# Patient Record
Sex: Female | Born: 1975
Health system: Southern US, Community
[De-identification: ages and names within clinical notes are randomized; demographics above are authoritative.]

## PROBLEM LIST (undated history)

## (undated) DIAGNOSIS — I10 Essential (primary) hypertension: Secondary | ICD-10-CM

## (undated) DIAGNOSIS — Z9221 Personal history of antineoplastic chemotherapy: Secondary | ICD-10-CM

## (undated) DIAGNOSIS — H269 Unspecified cataract: Secondary | ICD-10-CM

## (undated) DIAGNOSIS — H409 Unspecified glaucoma: Secondary | ICD-10-CM

## (undated) DIAGNOSIS — C50919 Malignant neoplasm of unspecified site of unspecified female breast: Secondary | ICD-10-CM

## (undated) DIAGNOSIS — Z5189 Encounter for other specified aftercare: Secondary | ICD-10-CM

## (undated) DIAGNOSIS — D649 Anemia, unspecified: Secondary | ICD-10-CM

## (undated) HISTORY — PX: BREAST BIOPSY: SHX20

## (undated) HISTORY — PX: REDUCTION MAMMAPLASTY: SUR839

## (undated) HISTORY — DX: Anemia, unspecified: D64.9

## (undated) HISTORY — DX: Essential (primary) hypertension: I10

## (undated) HISTORY — PX: CHOLECYSTECTOMY: SHX55

## (undated) HISTORY — PX: ERCP: SHX60

## (undated) HISTORY — DX: Encounter for other specified aftercare: Z51.89

## (undated) HISTORY — DX: Unspecified glaucoma: H40.9

## (undated) HISTORY — DX: Malignant neoplasm of unspecified site of unspecified female breast: C50.919

## (undated) HISTORY — DX: Unspecified cataract: H26.9

## (undated) HISTORY — PX: MASTECTOMY: SHX3

---

## 2018-06-19 ENCOUNTER — Other Ambulatory Visit: Payer: Self-pay

## 2018-06-19 ENCOUNTER — Emergency Department (HOSPITAL_COMMUNITY)
Admission: EM | Admit: 2018-06-19 | Discharge: 2018-06-20 | Disposition: A | Discharge status: Home / Self Care | Payer: Medicaid Other | Attending: Emergency Medicine | Admitting: Emergency Medicine

## 2018-06-19 ENCOUNTER — Encounter (HOSPITAL_COMMUNITY): Payer: Self-pay

## 2018-06-19 DIAGNOSIS — H9319 Tinnitus, unspecified ear: Secondary | ICD-10-CM | POA: Insufficient documentation

## 2018-06-19 DIAGNOSIS — R51 Headache: Secondary | ICD-10-CM | POA: Diagnosis present

## 2018-06-19 DIAGNOSIS — I10 Essential (primary) hypertension: Secondary | ICD-10-CM | POA: Insufficient documentation

## 2018-06-19 NOTE — ED Triage Notes (Addendum)
Pt reports that she feels like her BP is elevated because she has had a headache for 2 days. She states that she is not medicated for hypertension. She denies chest pain, but states that sometimes it feels hard to take a deep breath. A&Ox4. Ambulatory. Spanish-speaking.  Alice [name] #[id number]

## 2018-06-20 ENCOUNTER — Emergency Department (HOSPITAL_COMMUNITY): Payer: Medicaid Other

## 2018-06-20 ENCOUNTER — Encounter (HOSPITAL_COMMUNITY): Payer: Self-pay | Admitting: Emergency Medicine

## 2018-06-20 LAB — I-STAT TROPONIN, ED: Troponin i, poc: 0 ng/mL (ref 0.00–0.08)

## 2018-06-20 LAB — CBC WITH DIFFERENTIAL/PLATELET
Abs Immature Granulocytes: 0.03 10*3/uL (ref 0.00–0.07)
BASOS ABS: 0.1 10*3/uL (ref 0.0–0.1)
Basophils Relative: 1 %
Eosinophils Absolute: 0.2 10*3/uL (ref 0.0–0.5)
Eosinophils Relative: 2 %
HCT: 38.9 % (ref 36.0–46.0)
Hemoglobin: 12.3 g/dL (ref 12.0–15.0)
IMMATURE GRANULOCYTES: 0 %
Lymphocytes Relative: 30 %
Lymphs Abs: 3.6 10*3/uL (ref 0.7–4.0)
MCH: 29.2 pg (ref 26.0–34.0)
MCHC: 31.6 g/dL (ref 30.0–36.0)
MCV: 92.4 fL (ref 80.0–100.0)
Monocytes Absolute: 0.8 10*3/uL (ref 0.1–1.0)
Monocytes Relative: 7 %
NRBC: 0 % (ref 0.0–0.2)
Neutro Abs: 7.2 10*3/uL (ref 1.7–7.7)
Neutrophils Relative %: 60 %
Platelets: 334 10*3/uL (ref 150–400)
RBC: 4.21 MIL/uL (ref 3.87–5.11)
RDW: 12.9 % (ref 11.5–15.5)
WBC: 11.9 10*3/uL — AB (ref 4.0–10.5)

## 2018-06-20 LAB — I-STAT CHEM 8, ED
BUN: 11 mg/dL (ref 6–20)
Calcium, Ion: 1.19 mmol/L (ref 1.15–1.40)
Chloride: 104 mmol/L (ref 98–111)
Creatinine, Ser: 0.9 mg/dL (ref 0.44–1.00)
GLUCOSE: 105 mg/dL — AB (ref 70–99)
HCT: 38 % (ref 36.0–46.0)
Hemoglobin: 12.9 g/dL (ref 12.0–15.0)
Potassium: 4.3 mmol/L (ref 3.5–5.1)
Sodium: 138 mmol/L (ref 135–145)
TCO2: 26 mmol/L (ref 22–32)

## 2018-06-20 LAB — I-STAT BETA HCG BLOOD, ED (MC, WL, AP ONLY): I-stat hCG, quantitative: <5 m[IU]/mL (ref <5)

## 2018-06-20 NOTE — ED Notes (Addendum)
Pt c/o "buzzing in her ears",pressure on her left side of her chest, and headaches x1 day. Pt also c/o being dizzy sometimes, but not constantly. Pt states she does not have a PCP at this time to monitor blood pressures. Pt does not have any pain complaints at this time. Pt denies ever taking blood pressure medications.

## 2018-06-20 NOTE — ED Provider Notes (Signed)
[facility] DEPT Provider Note   CSN: [id number] Arrival date & time: 06/19/18  2156     History   Chief Complaint Chief Complaint  Patient presents with  . Hypertension    HPI Alice [name] is a 43 y.o. female.  The history is provided by the patient. The history is limited by a language barrier. A language interpreter was used.  Hypertension  This is a chronic problem. The current episode started more than 1 week ago. The problem occurs constantly. The problem has not changed since onset.Associated symptoms include headaches. Pertinent negatives include no chest pain, no abdominal pain and no shortness of breath. Nothing aggravates the symptoms. Nothing relieves the symptoms. She has tried nothing for the symptoms. The treatment provided no relief.  Has buzzing in the ears and forehead pain.  BP at home is 130s of 100 per her cuff.  Has no PMD here in [place].  No weakness no numbness no changes in vision or speech.    History reviewed. No pertinent past medical history.  There are no active problems to display for this patient.   History reviewed. No pertinent surgical history.   OB History   No obstetric history on file.      Home Medications    Prior to Admission medications   Medication Sig Start Date End Date Taking? Authorizing Provider  ibuprofen (ADVIL,MOTRIN) 200 MG tablet Take 400 mg by mouth every 6 (six) hours as needed for fever, headache or cramping.    Yes [provider]    Family History History reviewed. No pertinent family history.  Social History Social History   Tobacco Use  . Smoking status: Not on file  Substance Use Topics  . Alcohol use: Not on file  . Drug use: Not on file     Allergies   Patient has no known allergies.   Review of Systems Review of Systems  Constitutional: Negative for diaphoresis and fever.  Eyes: Negative for photophobia and visual disturbance.  Respiratory:  Negative for shortness of breath.   Cardiovascular: Negative for chest pain.  Gastrointestinal: Negative for abdominal pain and vomiting.  Neurological: Positive for headaches. Negative for dizziness, tremors, seizures, syncope, facial asymmetry, speech difficulty, light-headedness and numbness.  All other systems reviewed and are negative.    Physical Exam Updated Vital Signs BP (!) 142/101 (BP Location: Right Arm)   Pulse 97   Temp 98.7 F (37.1 C) (Oral)   Resp 18   Ht 5\' 4"  (1.626 m)   Wt 90.7 kg   SpO2 99%   BMI 34.33 kg/m   Physical Exam Constitutional:      Appearance: Normal appearance. She is not ill-appearing.  HENT:     Head: Normocephalic and atraumatic.     Comments: Scarring of B TM    Nose: Nose normal.     Mouth/Throat:     Mouth: Mucous membranes are moist.  Eyes:     Extraocular Movements: Extraocular movements intact.     Conjunctiva/sclera: Conjunctivae normal.     Pupils: Pupils are equal, round, and reactive to light.  Neck:     Musculoskeletal: Normal range of motion and neck supple.  Cardiovascular:     Rate and Rhythm: Normal rate and regular rhythm.     Heart sounds: Normal heart sounds.  Pulmonary:     Effort: Pulmonary effort is normal.     Breath sounds: Normal breath sounds.  Abdominal:     General: Abdomen is flat.  Bowel sounds are normal.     Tenderness: There is no abdominal tenderness.  Musculoskeletal: Normal range of motion.  Skin:    General: Skin is warm and dry.     Capillary Refill: Capillary refill takes less than 2 seconds.  Neurological:     General: No focal deficit present.     Mental Status: She is alert and oriented to person, place, and time.     GCS: GCS eye subscore is 4. GCS verbal subscore is 5. GCS motor subscore is 6.     Cranial Nerves: Cranial nerves are intact.     Deep Tendon Reflexes: Babinski sign absent on the right side. Babinski sign absent on the left side.      ED Treatments / Results   Labs (all labs ordered are listed, but only abnormal results are displayed) Results for orders placed or performed during the hospital encounter of 06/19/18  CBC with Differential/Platelet  Result Value Ref Range   WBC 11.9 (H) 4.0 - 10.5 K/uL   RBC 4.21 3.87 - 5.11 MIL/uL   Hemoglobin 12.3 12.0 - 15.0 g/dL   HCT 38.9 36.0 - 46.0 %   MCV 92.4 80.0 - 100.0 fL   MCH 29.2 26.0 - 34.0 pg   MCHC 31.6 30.0 - 36.0 g/dL   RDW 12.9 11.5 - 15.5 %   Platelets 334 150 - 400 K/uL   nRBC 0.0 0.0 - 0.2 %   Neutrophils Relative % 60 %   Neutro Abs 7.2 1.7 - 7.7 K/uL   Lymphocytes Relative 30 %   Lymphs Abs 3.6 0.7 - 4.0 K/uL   Monocytes Relative 7 %   Monocytes Absolute 0.8 0.1 - 1.0 K/uL   Eosinophils Relative 2 %   Eosinophils Absolute 0.2 0.0 - 0.5 K/uL   Basophils Relative 1 %   Basophils Absolute 0.1 0.0 - 0.1 K/uL   Immature Granulocytes 0 %   Abs Immature Granulocytes 0.03 0.00 - 0.07 K/uL  I-Stat Chem 8, ED  Result Value Ref Range   Sodium 138 135 - 145 mmol/L   Potassium 4.3 3.5 - 5.1 mmol/L   Chloride 104 98 - 111 mmol/L   BUN 11 6 - 20 mg/dL   Creatinine, Ser 0.90 0.44 - 1.00 mg/dL   Glucose, Bld 105 (H) 70 - 99 mg/dL   Calcium, Ion 1.19 1.15 - 1.40 mmol/L   TCO2 26 22 - 32 mmol/L   Hemoglobin 12.9 12.0 - 15.0 g/dL   HCT 38.0 36.0 - 46.0 %  I-stat troponin, ED  Result Value Ref Range   Troponin i, poc 0.00 0.00 - 0.08 ng/mL   Comment 3          I-Stat Beta hCG blood, ED ([facility], [facility], [facility] only)  Result Value Ref Range   I-stat hCG, quantitative <5.0 <5 mIU/mL   Comment 3           Ct Head Wo Contrast  Result Date: 06/20/2018 CLINICAL DATA:  Headache and hypertension. No injury. EXAM: CT HEAD WITHOUT CONTRAST TECHNIQUE: Contiguous axial images were obtained from the base of the skull through the vertex without intravenous contrast. COMPARISON:  None. FINDINGS: Brain: No evidence of acute infarction, hemorrhage, hydrocephalus, extra-axial collection or mass lesion/mass effect.  Vascular: No hyperdense vessel or unexpected calcification. Skull: Normal. Negative for fracture or focal lesion. Sinuses/Orbits: No acute finding. Other: None. IMPRESSION: Normal head CT. Electronically Signed   By: Lucienne Capers M.D.   On: 06/20/2018 01:06  EKG EKG Interpretation  Date/Time:  Saturday June 20 2018 00:42:05 EST Ventricular Rate:  85 PR Interval:    QRS Duration: 86 QT Interval:  357 QTC Calculation: 425 R Axis:   88 Text Interpretation:  Sinus rhythm Confirmed by Randal Buba, Elizardo Chilson ([id number]) on 06/20/2018 3:09:30 AM   Radiology Ct Head Wo Contrast  Result Date: 06/20/2018 CLINICAL DATA:  Headache and hypertension. No injury. EXAM: CT HEAD WITHOUT CONTRAST TECHNIQUE: Contiguous axial images were obtained from the base of the skull through the vertex without intravenous contrast. COMPARISON:  None. FINDINGS: Brain: No evidence of acute infarction, hemorrhage, hydrocephalus, extra-axial collection or mass lesion/mass effect. Vascular: No hyperdense vessel or unexpected calcification. Skull: Normal. Negative for fracture or focal lesion. Sinuses/Orbits: No acute finding. Other: None. IMPRESSION: Normal head CT. Electronically Signed   By: Lucienne Capers M.D.   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  No more Qtips.  Follow up with a PMD regarding BP checks.  Based on JNC no indication for treatment at this time.    Return for pain, intractable cough, productive cough,fevers >100.4 unrelieved by medication, shortness of breath, intractable vomiting, or diarrhea, abdominal pain, Inability to tolerate liquids or food, cough, altered mental status or any concerns. No signs of systemic illness or infection. The patient is nontoxic-appearing on exam and vital signs are  within normal limits.   I have reviewed the triage vital signs and the nursing notes. Pertinent labs &imaging results that were available during my care of the patient were reviewed by me and considered in my medical decision making (see chart for details).  After history, exam, and medical workup I feel the patient has been appropriately medically screened and is safe for discharge home. Pertinent diagnoses were discussed with the patient. Patient was given return precautions.   Hanzel Pizzo, MD 06/20/18 [phone]

## 2019-04-11 DIAGNOSIS — Z23 Encounter for immunization: Secondary | ICD-10-CM | POA: Diagnosis not present

## 2019-06-11 DIAGNOSIS — C50919 Malignant neoplasm of unspecified site of unspecified female breast: Secondary | ICD-10-CM

## 2019-06-11 HISTORY — DX: Malignant neoplasm of unspecified site of unspecified female breast: C50.919

## 2019-09-20 DIAGNOSIS — N62 Hypertrophy of breast: Secondary | ICD-10-CM | POA: Diagnosis not present

## 2019-09-20 DIAGNOSIS — I10 Essential (primary) hypertension: Secondary | ICD-10-CM | POA: Diagnosis not present

## 2019-09-20 DIAGNOSIS — N644 Mastodynia: Secondary | ICD-10-CM | POA: Diagnosis not present

## 2019-09-20 DIAGNOSIS — Z7689 Persons encountering health services in other specified circumstances: Secondary | ICD-10-CM | POA: Diagnosis not present

## 2019-09-23 DIAGNOSIS — N6323 Unspecified lump in the left breast, lower outer quadrant: Secondary | ICD-10-CM | POA: Diagnosis not present

## 2019-09-23 DIAGNOSIS — R922 Inconclusive mammogram: Secondary | ICD-10-CM | POA: Diagnosis not present

## 2019-09-23 DIAGNOSIS — N6311 Unspecified lump in the right breast, upper outer quadrant: Secondary | ICD-10-CM | POA: Diagnosis not present

## 2019-09-30 ENCOUNTER — Other Ambulatory Visit: Payer: Self-pay | Admitting: Radiology

## 2019-09-30 DIAGNOSIS — C50412 Malignant neoplasm of upper-outer quadrant of left female breast: Secondary | ICD-10-CM | POA: Diagnosis not present

## 2019-09-30 DIAGNOSIS — R590 Localized enlarged lymph nodes: Secondary | ICD-10-CM | POA: Diagnosis not present

## 2019-09-30 DIAGNOSIS — N6311 Unspecified lump in the right breast, upper outer quadrant: Secondary | ICD-10-CM | POA: Diagnosis not present

## 2019-10-01 ENCOUNTER — Telehealth: Payer: Self-pay | Admitting: Oncology

## 2019-10-01 ENCOUNTER — Encounter: Payer: Self-pay | Admitting: *Deleted

## 2019-10-01 NOTE — Telephone Encounter (Signed)
Spoke with patient with interpreter to confirm morning [facility] appointment for 4/28, packet mailed to patient

## 2019-10-05 ENCOUNTER — Other Ambulatory Visit: Payer: Self-pay | Admitting: *Deleted

## 2019-10-05 ENCOUNTER — Encounter: Payer: Self-pay | Admitting: *Deleted

## 2019-10-05 DIAGNOSIS — C50411 Malignant neoplasm of upper-outer quadrant of right female breast: Secondary | ICD-10-CM | POA: Insufficient documentation

## 2019-10-05 DIAGNOSIS — Z170 Estrogen receptor positive status [ER+]: Secondary | ICD-10-CM | POA: Insufficient documentation

## 2019-10-05 NOTE — Progress Notes (Signed)
[facility]  [phone] [phone]     ID: Alice [name] DOB: [birth year]  MR#: [id number]  ATF#:[id number]  Patient Care Team: Patient, No Pcp Per as PCP - General (General Practice) Mauro Kaufmann, RN as Oncology Nurse Navigator Rockwell Germany, RN as Oncology Nurse Navigator Alphonsa Overall, MD as Consulting Physician (General Surgery) Maxfield Gildersleeve, Virgie Dad, MD as Consulting Physician (Oncology) Kyung Rudd, MD as Consulting Physician (Radiation Oncology) Chauncey Cruel, MD OTHER MD:  CHIEF COMPLAINT: estrogen receptor negative, Her2 positive breast cancer  CURRENT TREATMENT: Neoadjuvant chemotherapy   HISTORY OF CURRENT ILLNESS: Alice [name] presented with right breast pain with swelling and skin changes (duskiness and dimpling) and left breast pain at 6 o'clock. She underwent bilateral diagnostic mammography with tomography and bilateral breast ultrasonography at [facility] on 09/23/2019 showing: breast density category C; 5.2 cm irregular mass in right breast spanning 10-12 o'clock, with marked peau d'orange changes concerning for inflammatory component; 3.3 cm enlarged lymph node in right axilla; indeterminate 1 cm oval mass in left breast at 5 o'clock.  Accordingly on 09/30/2019 she proceeded to biopsy of the right breast area in question. The pathology from this procedure ([id number]) showed: invasive mammary carcinoma, grade 3, e-cadherin positive. Prognostic indicators significant for: estrogen receptor, 0% negative and progesterone receptor, 0% negative. Proliferation marker Ki67 at 80%. HER2 positive by immunohistochemistry (3+).  The biopsied right axillary lymph node was positive for metastatic carcinoma.  She also underwent cyst aspiration of the 1 cm mass in the left breast the same day.  The patient's subsequent history is as detailed below.   INTERVAL HISTORY: Alice [name] was evaluated in the multidisciplinary breast cancer clinic on  10/06/2019. Her case was also presented at the multidisciplinary breast cancer conference on the same day. At that time a preliminary plan was proposed: Neoadjuvant chemoimmunotherapy, likely a right modified radical versus right mastectomy with targeted axillary dissection, possibly bilateral mastectomies, adjuvant radiation, genetics testing   REVIEW OF SYSTEMS: The patient denies unusual headaches, visual changes, nausea, vomiting, stiff neck, dizziness, or gait imbalance. There has been no cough, phlegm production, or pleurisy, no chest pain or pressure, and no change in bowel or bladder habits. The patient denies fever, rash, bleeding, unexplained fatigue or unexplained weight loss.  She is currently not exercising regularly.  A detailed review of systems was otherwise entirely negative.   PAST MEDICAL HISTORY: Past Medical History:  Diagnosis Date  . Hypertension     PAST SURGICAL HISTORY: Past Surgical History:  Procedure Laterality Date  . CHOLECYSTECTOMY      FAMILY HISTORY: No family history on file.  As of 09/2019-- her father is 67 and her mother is 38. She has two brothers and one sister. There is no cancer in her family to her knowledge.    GYNECOLOGIC HISTORY:  No LMP recorded. Menarche: 44 years old Age at first live birth: 44 years old GX P 2 LMP regular periods lasting 3 days Contraceptive: previously used, has not used for the last 2 years HRT n/a  Hysterectomy? no BSO? no   SOCIAL HISTORY: (updated 09/2019)  Alice [name] is originally from [place]  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 Husband [name] is currently unemployed but is a good Training and development officer. She lives at home with [name] and their two children. Son [name], age 19, has special needs.  Son [name], age 30, is in high school. She attends     ADVANCED DIRECTIVES: In the absence  of any documentation to the contrary, the patient's spouse is their HCPOA.    HEALTH  MAINTENANCE: Social History   Tobacco Use  . Smoking status: Never Smoker  Substance Use Topics  . Alcohol use: Never  . Drug use: Never     Colonoscopy: n/a (age)  PAP: 2020  Bone density: n/a (age)   No Known Allergies  Current Outpatient Medications  Medication Sig Dispense Refill  . ibuprofen (ADVIL,MOTRIN) 200 MG tablet Take 400 mg by mouth every 6 (six) hours as needed for fever, headache or cramping.      No current facility-administered medications for this visit.    OBJECTIVE: [name] in no acute distress  Vitals:   10/06/19 0844  BP: (!) 164/89  Pulse: 91  Resp: 18  Temp: 98.5 F (36.9 C)  SpO2: 100%     Body mass index is 35.29 kg/m.   Wt Readings from Last 3 Encounters:  10/06/19 205 lb 9.6 oz (93.3 kg)  06/19/18 200 lb (90.7 kg)      ECOG FS:1 - Symptomatic but completely ambulatory  Ocular: Sclerae unicteric, pupils round and equal Ear-nose-throat: Wearing a mask Lymphatic: No cervical or supraclavicular adenopathy Lungs no rales or rhonchi Heart regular rate and rhythm Abd soft, nontender, positive bowel sounds MSK no focal spinal tenderness, no joint edema Neuro: non-focal, well-oriented, appropriate affect Breasts: The right breast is imaged below.  There is skin edema but no erythema.  The mass in the breast is easily movable, and clearly measures more than 5 cm by exam.  The right axillary adenopathy is barely palpable.  Left breast status post recent biopsy.  No suspicious findings.  Left axilla is benign.  Right breast 10/06/2019     LAB RESULTS:  CMP     Component Value Date/Time   NA 138 10/06/2019 0826   K 3.8 10/06/2019 0826   CL 104 10/06/2019 0826   CO2 24 10/06/2019 0826   GLUCOSE 117 (H) 10/06/2019 0826   BUN 11 10/06/2019 0826   CREATININE 0.79 10/06/2019 0826   CALCIUM 9.3 10/06/2019 0826   PROT 7.8 10/06/2019 0826   ALBUMIN 3.9 10/06/2019 0826   AST 26 10/06/2019 0826   ALT 47 (H) 10/06/2019  0826   ALKPHOS 113 10/06/2019 0826   BILITOT 0.7 10/06/2019 0826   GFRNONAA >60 10/06/2019 0826   GFRAA >60 10/06/2019 0826    No results found for: TOTALPROTELP, ALBUMINELP, A1GS, A2GS, BETS, BETA2SER, GAMS, MSPIKE, SPEI  Lab Results  Component Value Date   WBC 11.9 (H) 06/20/2018   NEUTROABS 7.2 06/20/2018   HGB 12.9 06/20/2018   HCT 38.0 06/20/2018   MCV 92.4 06/20/2018   PLT 334 06/20/2018    No results found for: LABCA2  No components found for: EGBTDV761  No results for input(s): INR in the last 168 hours.  No results found for: LABCA2  No results found for: YWV371  No results found for: GGY694  No results found for: WNI627  No results found for: CA2729  No components found for: HGQUANT  No results found for: CEA1 / No results found for: CEA1   No results found for: AFPTUMOR  No results found for: CHROMOGRNA  No results found for: KPAFRELGTCHN, LAMBDASER, KAPLAMBRATIO (kappa/lambda light chains)  No results found for: HGBA, HGBA2QUANT, HGBFQUANT, HGBSQUAN (Hemoglobinopathy evaluation)   No results found for: LDH  No results found for: IRON, TIBC, IRONPCTSAT (Iron and TIBC)  No results found for: FERRITIN  Urinalysis No  results found for: COLORURINE, APPEARANCEUR, LABSPEC, PHURINE, GLUCOSEU, HGBUR, BILIRUBINUR, KETONESUR, PROTEINUR, UROBILINOGEN, NITRITE, LEUKOCYTESUR   STUDIES: No results found.   ELIGIBLE FOR AVAILABLE RESEARCH PROTOCOL: no  ASSESSMENT: 44 y.o. [place] speaker status post right breast upper outer quadrant sites biopsy and right axillary lymph node biopsy 09/30/2019 for a clinical T3 N2, stage IIIA invasive ductal carcinoma, grade 3, estrogen and progesterone receptor negative, HER-2 amplified, with an MIB-1 of 80%.  (1) genetics testing  (2) neoadjuvant chemotherapy to consist of trastuzumab, pertuzumab, docetaxel and carboplatin every 21 days x 6 starting 10/19/2019  (3) trastuzumab and pertuzumab to be  continued to total 1 year  (4) definitive surgery to follow  (5) adjuvant radiation.  PLAN: I met today with Alice [name] to review her new diagnosis. Specifically we discussed the biology of her breast cancer, its diagnosis, staging, treatment  options and prognosis. We first reviewed the fact that cancer is not one disease but more than 100 different diseases and that it is important to keep them separate-- otherwise when friends and relatives discuss their own cancer experiences with Alice [name] confusion can result. Similarly we explained that if breast cancer spreads to the bone or liver, the patient would not have bone cancer or liver cancer, but breast cancer in the bone and breast cancer in the liver: one cancer in three places-- not 3 different cancers which otherwise would have to be treated in 3 different ways.  We discussed the difference between local and systemic therapy. In terms of loco-regional treatment, lumpectomy plus radiation is equivalent to mastectomy as far as survival is concerned. For this reason, and because the cosmetic results are generally superior, we recommend breast conserving surgery.   We also noted that in terms of sequencing of treatments, whether systemic therapy or surgery is done first does not affect the ultimate outcome.  In her case starting with neoadjuvant chemotherapy should give [place] good prognostic information depending on her response, make the definitive surgery easier, and possibly spare her a full axillary lymph node dissection.  We then discussed the rationale for systemic therapy. There is some risk that this cancer may have already spread to other parts of her body. Patients frequently ask at this point about bone scans, CAT scans and PET scans to find out if they have occult breast cancer somewhere else. The problem is that in early stage disease we are much more likely to find false positives then true cancers and this would expose the patient to unnecessary  procedures as well as unnecessary radiation. Scans cannot answer the question the patient really would like to know, which is whether she has microscopic disease elsewhere in her body. For those reasons we do not recommend them.  Of course we would proceed to aggressive evaluation of any symptoms that might suggest metastatic disease, but that is not the case here.  Next we went over the options for systemic therapy which are anti-estrogens, anti-HER-2 immunotherapy, and chemotherapy. Alice [name] does not meet criteria for anti-estrogens.  Her systemic therapy accordingly will be chemotherapy and anti-HER-2 treatment.  More specifically she will receive carboplatin and docetaxel together with trastuzumab and Pertuzumab every 21 days x 6.  Hopefully we can start treatment on 10/19/2019.  She will need staging studies, echocardiography, port placement, and instruction.  I will see her on 10/15/2019 to make sure things in place before starting the following Tuesday  She also qualifies for genetics testing.  If positive I would recommend bilateral mastectomies given her young  age.  In fact bilateral mastectomies may be a good option for her in any case since it would be very difficult to obtain symmetry after unilateral mastectomies given her very large breast size  The patient has a good understanding of this plan.  She agrees to it.  She knows the goal of treatment in her case is cure.  Total encounter time 65 minutes.Sarajane Jews C. Marinell Igarashi, MD 10/06/2019 10:11 AM Medical Oncology and Hematology [facility] [place], [place] [address] Tel. [phone]    Fax. [phone]   This document serves as a record of services personally performed by Lurline Del, MD. It was created on his behalf by Wilburn Mylar, a trained medical scribe. The creation of this record is based on the scribe's personal observations and the provider's statements to them.   I, Lurline Del MD,  have reviewed the above documentation for accuracy and completeness, and I agree with the above.    *Total Encounter Time as defined by the Centers for Medicare and Medicaid Services includes, in addition to the face-to-face time of a patient visit (documented in the note above) non-face-to-face time: obtaining and reviewing outside history, ordering and reviewing medications, tests or procedures, care coordination (communications with other health care professionals or caregivers) and documentation in the medical record.

## 2019-10-06 ENCOUNTER — Other Ambulatory Visit: Payer: Self-pay

## 2019-10-06 ENCOUNTER — Encounter: Payer: Self-pay | Admitting: *Deleted

## 2019-10-06 ENCOUNTER — Encounter: Payer: Self-pay | Admitting: Physical Therapy

## 2019-10-06 ENCOUNTER — Inpatient Hospital Stay (HOSPITAL_BASED_OUTPATIENT_CLINIC_OR_DEPARTMENT_OTHER): Payer: Medicaid Other | Admitting: Oncology

## 2019-10-06 ENCOUNTER — Other Ambulatory Visit: Payer: Self-pay | Admitting: *Deleted

## 2019-10-06 ENCOUNTER — Ambulatory Visit: Payer: Medicaid Other | Attending: Surgery | Admitting: Physical Therapy

## 2019-10-06 ENCOUNTER — Ambulatory Visit
Admission: RE | Admit: 2019-10-06 | Discharge: 2019-10-06 | Disposition: A | Discharge status: Home / Self Care | Payer: Medicaid Other | Source: Ambulatory Visit | Attending: Radiation Oncology | Admitting: Radiation Oncology

## 2019-10-06 ENCOUNTER — Inpatient Hospital Stay: Payer: Medicaid Other | Attending: Oncology

## 2019-10-06 ENCOUNTER — Encounter: Payer: Self-pay | Admitting: Licensed Clinical Social Worker

## 2019-10-06 ENCOUNTER — Ambulatory Visit: Payer: Medicaid Other | Admitting: Licensed Clinical Social Worker

## 2019-10-06 VITALS — BP 164/89 | HR 91 | Temp 98.5°F | Resp 18 | Ht 64.0 in | Wt 205.6 lb

## 2019-10-06 DIAGNOSIS — C50411 Malignant neoplasm of upper-outer quadrant of right female breast: Secondary | ICD-10-CM | POA: Insufficient documentation

## 2019-10-06 DIAGNOSIS — I10 Essential (primary) hypertension: Secondary | ICD-10-CM

## 2019-10-06 DIAGNOSIS — Z171 Estrogen receptor negative status [ER-]: Secondary | ICD-10-CM | POA: Insufficient documentation

## 2019-10-06 DIAGNOSIS — R293 Abnormal posture: Secondary | ICD-10-CM | POA: Diagnosis not present

## 2019-10-06 DIAGNOSIS — Z170 Estrogen receptor positive status [ER+]: Secondary | ICD-10-CM

## 2019-10-06 DIAGNOSIS — C773 Secondary and unspecified malignant neoplasm of axilla and upper limb lymph nodes: Secondary | ICD-10-CM | POA: Diagnosis not present

## 2019-10-06 DIAGNOSIS — C50911 Malignant neoplasm of unspecified site of right female breast: Secondary | ICD-10-CM | POA: Diagnosis not present

## 2019-10-06 LAB — CMP (CANCER CENTER ONLY)
ALT: 47 U/L — ABNORMAL HIGH (ref 0–44)
AST: 26 U/L (ref 15–41)
Albumin: 3.9 g/dL (ref 3.5–5.0)
Alkaline Phosphatase: 113 U/L (ref 38–126)
Anion gap: 10 (ref 5–15)
BUN: 11 mg/dL (ref 6–20)
CO2: 24 mmol/L (ref 22–32)
Calcium: 9.3 mg/dL (ref 8.9–10.3)
Chloride: 104 mmol/L (ref 98–111)
Creatinine: 0.79 mg/dL (ref 0.44–1.00)
GFR, Est AFR Am: >60 mL/min (ref >60)
GFR, Estimated: >60 mL/min (ref >60)
Glucose, Bld: 117 mg/dL — ABNORMAL HIGH (ref 70–99)
Potassium: 3.8 mmol/L (ref 3.5–5.1)
Sodium: 138 mmol/L (ref 135–145)
Total Bilirubin: 0.7 mg/dL (ref 0.3–1.2)
Total Protein: 7.8 g/dL (ref 6.5–8.1)

## 2019-10-06 LAB — GENETIC SCREENING ORDER

## 2019-10-06 MED ORDER — LORATADINE 10 MG PO TABS
10.0000 mg | ORAL_TABLET | Freq: Every day | ORAL | 1 refills | Status: DC
Start: 2019-10-06 — End: 2019-11-30

## 2019-10-06 MED ORDER — PROCHLORPERAZINE MALEATE 10 MG PO TABS: 10.0000 mg | ORAL_TABLET | Freq: Four times a day (QID) | ORAL | 1 refills | Status: DC | PRN

## 2019-10-06 MED ORDER — LORAZEPAM 0.5 MG PO TABS: 0.5000 mg | ORAL_TABLET | Freq: Every evening | ORAL | 0 refills | Status: DC | PRN

## 2019-10-06 MED ORDER — DEXAMETHASONE 4 MG PO TABS: 8.0000 mg | ORAL_TABLET | Freq: Two times a day (BID) | ORAL | 1 refills | Status: DC

## 2019-10-06 MED ORDER — LIDOCAINE-PRILOCAINE 2.5-2.5 % EX CREA: TOPICAL_CREAM | CUTANEOUS | 3 refills | Status: DC

## 2019-10-06 NOTE — Progress Notes (Signed)
START ON PATHWAY REGIMEN - Breast     A cycle is every 21 days:     Pertuzumab      Pertuzumab      Trastuzumab-xxxx      Trastuzumab-xxxx      Carboplatin      Docetaxel   **Always confirm dose/schedule in your pharmacy ordering system**  Patient Characteristics: Preoperative or Nonsurgical Candidate (Clinical Staging), Neoadjuvant Therapy followed by Surgery, Invasive Disease, Chemotherapy, HER2 Positive, ER Negative/Unknown Therapeutic Status: Preoperative or Nonsurgical Candidate (Clinical Staging) AJCC M Category: cM0 AJCC Grade: G3 Breast Surgical Plan: Neoadjuvant Therapy followed by Surgery ER Status: Negative (-) AJCC 8 Stage Grouping: IIIA HER2 Status: Positive (+) AJCC T Category: cT3 AJCC N Category: cN2 PR Status: Negative (-) Intent of Therapy: Curative Intent, Discussed with Patient

## 2019-10-06 NOTE — Progress Notes (Signed)
Radiation Oncology         ([phone]) [phone] ________________________________  Name: Alice [name]        MRN: [id number]  Date of Service: 10/06/2019 DOB: Apr 19, 1976  GL:OVFIEPPI, Blanche East, MD  Alphonsa Overall, MD     REFERRING PHYSICIAN: Alphonsa Overall, MD   DIAGNOSIS: The encounter diagnosis was Malignant neoplasm of upper-outer quadrant of right breast in female, estrogen receptor positive ([place]).   HISTORY OF PRESENT ILLNESS: Alice [name] is a 44 y.o. female seen in the multidisciplinary breast clinic for a new diagnosis of right breast cancer. The patient was noted to have right skin dimpling and she presented for diagnostic imaging that revealed a 3.3 cm distortion.  Further diagnostic imaging revealed a mass from 10-12 o'clock in the right breast measuring up to 5.2 cm with 5 mm of trabecular skin thickening and one abnormal axillary lymph node.  In imaging the breast, it appears that she did have features of possible lymphedema versus peau d'orange.  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  She is seen today to discuss treatment recommendations for her cancer.    PREVIOUS RADIATION THERAPY: No   PAST MEDICAL HISTORY:  Past Medical History:  Diagnosis Date  . Hypertension        PAST SURGICAL HISTORY: Past Surgical History:  Procedure Laterality Date  . CHOLECYSTECTOMY       FAMILY HISTORY: No family history on file.   SOCIAL HISTORY:  reports that she has never smoked. She does not have any smokeless tobacco history on file. She reports that she does not drink alcohol or use drugs.  The patient is married and lives in [place].  She is [place]  She is lived in the [place] approximately 10 years.  She works in a Company secretary.   ALLERGIES: Patient has no known  allergies.   MEDICATIONS:  Current Outpatient Medications  Medication Sig Dispense Refill  . dexamethasone (DECADRON) 4 MG tablet Take 2 tablets (8 mg total) by mouth 2 (two) times daily. Start the day before Taxotere. Take once the day after, then 2 times a day x 2d. 30 tablet 1  . ibuprofen (ADVIL,MOTRIN) 200 MG tablet Take 400 mg by mouth every 6 (six) hours as needed for fever, headache or cramping.     . lidocaine-prilocaine (EMLA) cream Apply to affected area once 30 g 3  . loratadine (CLARITIN) 10 MG tablet Take 1 tablet (10 mg total) by mouth daily. 60 tablet 1  . LORazepam (ATIVAN) 0.5 MG tablet Take 1 tablet (0.5 mg total) by mouth at bedtime as needed (Nausea or vomiting). 20 tablet 0  . prochlorperazine (COMPAZINE) 10 MG tablet Take 1 tablet (10 mg total) by mouth every 6 (six) hours as needed (Nausea or vomiting). 30 tablet 1   No current facility-administered medications for this encounter.     REVIEW OF SYSTEMS: On review of systems, the patient reports that she is doing well overall. She denies any chest pain, shortness of breath, cough, fevers, chills, night sweats, unintended weight changes. She denies any bowel or bladder disturbances, and denies abdominal pain, nausea or vomiting. She denies any new musculoskeletal or joint aches or pains. A complete review of systems is obtained and is otherwise negative.     PHYSICAL EXAM:  Wt Readings from Last 3 Encounters:  10/06/19 205 lb 9.6 oz (93.3 kg)  06/19/18 200 lb (90.7 kg)   Temp Readings from Last 3 Encounters:  10/06/19 98.5 F (36.9 C) (Temporal)  06/19/18 98.7 F (37.1 C) (Oral)   BP Readings from Last 3 Encounters:  10/06/19 (!) 164/89  06/20/18 (!) 142/101   Pulse Readings from Last 3 Encounters:  10/06/19 91  06/20/18 97    In general this is a well appearing Hispanic female in no acute distress. She's alert and oriented x4 and appropriate throughout the examination. Cardiopulmonary assessment is  negative for acute distress and she exhibits normal effort. Bilateral breast exam is deferred.    ECOG = 1  0 - Asymptomatic (Fully active, able to carry on all predisease activities without restriction)  1 - Symptomatic but completely ambulatory (Restricted in physically strenuous activity but ambulatory and able to carry out work of a light or sedentary nature. For example, light housework, office work)  2 - Symptomatic, <50% in bed during the day (Ambulatory and capable of all self care but unable to carry out any work activities. Up and about more than 50% of waking hours)  3 - Symptomatic, >50% in bed, but not bedbound (Capable of only limited self-care, confined to bed or chair 50% or more of waking hours)  4 - Bedbound (Completely disabled. Cannot carry on any self-care. Totally confined to bed or chair)  5 - Death   Eustace Pen MM, Creech RH, Tormey DC, et al. [phone]). "Toxicity and response criteria of the [facility]". [place] Oncol. 5 (6): 649-55    LABORATORY DATA:  Lab Results  Component Value Date   WBC 11.9 (H) 06/20/2018   HGB 12.9 06/20/2018   HCT 38.0 06/20/2018   MCV 92.4 06/20/2018   PLT 334 06/20/2018   Lab Results  Component Value Date   NA 138 10/06/2019   K 3.8 10/06/2019   CL 104 10/06/2019   CO2 24 10/06/2019   Lab Results  Component Value Date   ALT 47 (H) 10/06/2019   AST 26 10/06/2019   ALKPHOS 113 10/06/2019   BILITOT 0.7 10/06/2019      RADIOGRAPHY: No results found.     IMPRESSION/PLAN: 1. 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 Dr. Lisbeth Renshaw discusses the pathology findings and reviews the nature of right breast disease. The consensus from the breast conference includes proceeding with neoadjuvant chemotherapy. Her surgery is to be determined, but following surgery, she would benefit from  external radiotherapy to reduce the risks of local recurrence. Dr. Jana Hakim  also anticipates a role for antiestrogen therapy given her age. We discussed the risks, benefits, short, and long term effects of radiotherapy, and the patient is interested in proceeding. Dr. Lisbeth Renshaw discusses the delivery and logistics of radiotherapy and anticipates a course of 6 1/2 weeks of radiotherapy to either the breast or chest wall and regional nodes. We will see her back about 2 weeks after surgery to discuss the simulation process and anticipate we starting radiotherapy about 4-6 weeks after surgery.  2. Possible genetic predisposition to malignancy. The patient is a candidate for genetic testing given her personal and family history. She was offered referral and will meet with genetic counseling today.   In a visit lasting 60 minutes, greater than 50% of the time was spent face to face reviewing her case, as well as in preparation of, discussing, and coordinating the  patient's care.  Communication was also facilitated by the presence of a Spanish-speaking interpreter.  The above documentation reflects my direct findings during this shared patient visit. Please see the separate note by Dr. Lisbeth Renshaw on this date for the remainder of the patient's plan of care.    Carola Rhine, PAC

## 2019-10-06 NOTE — Progress Notes (Signed)
REFERRING PROVIDER: Chauncey Cruel, MD [address] [place],  [place] [address]  PRIMARY PROVIDER:  Drue Flirt, MD  PRIMARY REASON FOR VISIT:  1. Malignant neoplasm of upper-outer quadrant of right breast in female, estrogen receptor positive ([place])    I connected with [name] on 10/06/2019 at 11:15 AM EDT by Jackquline Denmark and verified that I am speaking with the correct person using two identifiers.    Patient location: [facility] Provider location: [facility]   HISTORY OF PRESENT ILLNESS:   [name], a 44 y.o. female, was seen for a [place] cancer genetics consultation at the request of Dr. Jana Hakim due to a personal history of cancer.  [name] presents to clinic today to discuss the possibility of a hereditary predisposition to cancer, genetic testing, and to further clarify her future cancer risks, as well as potential cancer risks for family members.   In 2021, at the age of 23, [name] was diagnosed with invasive ductal carcinoma of the right breast. The treatment plan includes neoadjuvant chemotherapy, surgery, and adjuvant radiation.    CANCER HISTORY:  Oncology History  Malignant neoplasm of upper-outer quadrant of right breast in female, estrogen receptor positive ([place])  10/05/2019 Initial Diagnosis   Malignant neoplasm of upper-outer quadrant of right breast in female, estrogen receptor positive ([place])   10/19/2019 -  Chemotherapy   The patient had palonosetron (ALOXI) injection 0.25 mg, 0.25 mg, Intravenous,  Once, 0 of 6 cycles pegfilgrastim-jmdb (FULPHILA) injection 6 mg, 6 mg, Subcutaneous,  Once, 0 of 6 cycles CARBOplatin (PARAPLATIN) 750 mg in sodium chloride 0.9 % 250 mL chemo infusion, 750 mg (100 % of original dose 750 mg), Intravenous,  Once, 0 of 6 cycles Dose modification:   (original dose 750 mg, Cycle 1) DOCEtaxel (TAXOTERE) 150 mg in sodium chloride 0.9 % 250 mL chemo infusion, 75 mg/m2 = 150 mg, Intravenous,  Once, 0 of 6  cycles fosaprepitant (EMEND) 150 mg in sodium chloride 0.9 % 145 mL IVPB, 150 mg, Intravenous,  Once, 0 of 6 cycles pertuzumab (PERJETA) 420 mg in sodium chloride 0.9 % 250 mL chemo infusion, 420 mg (50 % of original dose 840 mg), Intravenous, Once, 0 of 6 cycles Dose modification: 420 mg (original dose 840 mg, Cycle 1, Reason: Provider Judgment) trastuzumab-dkst (OGIVRI) 756 mg in sodium chloride 0.9 % 250 mL chemo infusion, 8 mg/kg = 756 mg, Intravenous,  Once, 0 of 6 cycles  for chemotherapy treatment.       RISK FACTORS:  Menarche was at age 77 First live birth at age 38.  OCP use: yes previously Ovaries intact: yes.  Hysterectomy: no.  Menopausal status: premenopausal.  HRT use: 0 years. Colonoscopy: no; not examined. Mammogram within the last year: yes. Up to date with pelvic exams: yes. Any excessive radiation exposure in the past: no  Past Medical History:  Diagnosis Date  . Hypertension     Past Surgical History:  Procedure Laterality Date  . CHOLECYSTECTOMY      Social History   Socioeconomic History  . Marital status: Married    Spouse name: Not on file  . Number of children: Not on file  . Years of education: Not on file  . Highest education level: Not on file  Occupational History  . Not on file  Tobacco Use  . Smoking status: Never Smoker  Substance and Sexual Activity  . Alcohol use: Never  . Drug use: Never  . Sexual activity: Not on file  Other Topics Concern  . Not on file  Social History Narrative  . Not on file   Social Determinants of Health   Financial Resource Strain:   . Difficulty of Paying Living Expenses:   Food Insecurity:   . Worried About Charity fundraiser in the Last Year:   . Arboriculturist in the Last Year:   Transportation Needs:   . Film/video editor (Medical):   Marland Kitchen Lack of Transportation (Non-Medical):   Physical Activity:   . Days of Exercise per Week:   . Minutes of Exercise per Session:   Stress:   .  Feeling of Stress :   Social Connections:   . Frequency of Communication with Friends and Family:   . Frequency of Social Gatherings with Friends and Family:   . Attends Religious Services:   . Active Member of Clubs or Organizations:   . Attends Archivist Meetings:   Marland Kitchen Marital Status:      FAMILY HISTORY:  We obtained a detailed, 4-generation family history.  Significant diagnoses are listed below: No family history on file.   [name] has two sons, one is 17 and the other is 51. Patient has 2 brothers and 1 sister, all living, no cancer.   Patient has limited information about the rest of her family and does not know of any other cancers. Her mother is living at 31 and father is living at 47. Her mother had 40 siblings and father had many siblings as well. All grandparents are deceased over the age of 23.   [name] is unaware of previous family history of genetic testing for hereditary cancer risks. Patient's maternal ancestors are of unknown descent, and paternal ancestors are of unknown descent. There is no reported Ashkenazi Jewish ancestry. There is no known consanguinity.  GENETIC COUNSELING ASSESSMENT: [name] is a 44 y.o. female with a personal history of breast cancer which is somewhat suggestive of a hereditary cancer syndrome and predisposition to cancer. We, therefore, discussed and recommended the following at today's visit.   DISCUSSION: We discussed that 5 - 10% of breast cancer is hereditary, with most cases associated with BRCA1/BRCA2 mutations.  There are other genes that can be associated with hereditary breast cancer syndromes. We discussed that testing is beneficial for several reasons including surgical decision-making for breast cancer, knowing how to follow individuals after completing their treatment, and understand if other family members could be at risk for cancer and allow them to undergo genetic testing.   We reviewed the characteristics,  features and inheritance patterns of hereditary cancer syndromes. We also discussed genetic testing, including the appropriate family members to test, the process of testing, insurance coverage and turn-around-time for results. We discussed the implications of a negative, positive and/or variant of uncertain significant result. We recommended [name] pursue genetic testing for the Common Hereditary Cancers gene panel.   The Common Hereditary Cancers Panel offered by [facility] includes sequencing and/or deletion duplication testing of the following 48 genes: APC, ATM, AXIN2, BARD1, BMPR1A, BRCA1, BRCA2, BRIP1, CDH1, CDKN2A (p14ARF), CDKN2A (p16INK4a), CKD4, CHEK2, CTNNA1, DICER1, EPCAM (Deletion/duplication testing only), GREM1 (promoter region deletion/duplication testing only), KIT, MEN1, MLH1, MSH2, MSH3, MSH6, MUTYH, NBN, NF1, NHTL1, PALB2, PDGFRA, PMS2, POLD1, POLE, PTEN, RAD50, RAD51C, RAD51D, RNF43, SDHB, SDHC, SDHD, SMAD4, SMARCA4. STK11, TP53, TSC1, TSC2, and VHL.  The following genes were evaluated for sequence changes only: SDHA and HOXB13 c.251G>A variant only.  Based on [name] personal and  family history of cancer, she meets medical criteria for genetic testing. Despite that she meets criteria, she may still have an out of pocket cost.   PLAN: After considering the risks, benefits, and limitations, [name] provided informed consent to pursue genetic testing and the blood sample was sent to [facility] for analysis of the Common Hereditary Cancers Panel. Results should be available within approximately 2-3 weeks' time, at which point they will be disclosed by telephone to [name], as will any additional recommendations warranted by these results. [name] will receive a summary of her genetic counseling visit and a copy of her results once available. This information will also be available in Epic.   [name] questions were answered to her satisfaction today. Our  contact information was provided should additional questions or concerns arise. Thank you for the referral and allowing [place] to share in the care of your patient.   [name] Rogue, MS, [facility] Genetic Counselor [email or web address] Phone: [phone]  The patient was seen for a total of 15 minutes in face-to-face genetic counseling. An interpreter and Engineer, building services were also present.  Drs. Magrinat, Lindi Adie and/or Burr Medico were available for discussion regarding this case.   _______________________________________________________________________ For Office Staff:  Number of people involved in session: 3 Was an Intern/ student involved with case: no

## 2019-10-06 NOTE — Patient Instructions (Signed)

## 2019-10-06 NOTE — Therapy (Signed)
[place], [place], [address] Phone: [phone]   Fax:  [phone]  Physical Therapy Evaluation  Patient Details  Name: Alice [name] MRN: [id number] Date of Birth: 02/28/1976 Referring Provider (PT): Dr. Alphonsa Overall   Encounter Date: 10/06/2019  PT End of Session - 10/06/19 0951    Visit Number  1    Number of Visits  2    Date for PT Re-Evaluation  12/01/19    PT Start Time  1012    PT Stop Time  1041    PT Time Calculation (min)  29 min    Activity Tolerance  Patient tolerated treatment well    Behavior During Therapy  [facility] for tasks assessed/performed       Past Medical History:  Diagnosis Date  . Hypertension     Past Surgical History:  Procedure Laterality Date  . CHOLECYSTECTOMY      There were no vitals filed for this visit.   Subjective Assessment - 10/06/19 0941    Subjective  Patient reports she is here today to be seen by her meical team for her newly diagnosed right breast cancer.    Patient is accompained by:  Interpreter    Pertinent History  Patient was diagnosed on 09/23/2019 with right grade III invasive ductal carcinoma breast cancer. It measures 5.2 cm and is located in the upper outer quadrant. It is ER/PR negative, HER2 positive with a Ki67 of 80%. She has multiple enlarged nodes with one biopsied and found to be positive.    Patient Stated Goals  Reduce lymphedema risk and learn post op shoulder ROM HEP    Currently in Pain?  No/denies         [facility] PT Assessment - 10/06/19 0001      Assessment   Medical Diagnosis  Right breast cancer    Referring Provider (PT)  Dr. Alphonsa Overall    Onset Date/Surgical Date  09/23/19    Hand Dominance  Right    Prior Therapy  none      Precautions   Precautions  Other (comment)    Precaution Comments  active cancer      Restrictions   Weight Bearing Restrictions  No      Balance Screen   Has the patient fallen in the  past 6 months  No    Has the patient had a decrease in activity level because of a fear of falling?   No    Is the patient reluctant to leave their home because of a fear of falling?   No      Home Environment   Living Environment  Private residence    Living Arrangements  Spouse/significant other;Children   Husband, 43 and 36 y.o. 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    Leisure  She does not exercise      Cognition   Overall Cognitive Status  Within Functional Limits for tasks assessed      Posture/Postural Control   Posture/Postural Control  Postural limitations    Postural Limitations  Rounded Shoulders;Forward head      ROM / Strength   AROM / PROM / Strength  AROM;Strength      AROM   Overall AROM Comments  Cervical AROM is all WNL  AROM Assessment Site  Shoulder    Right/Left Shoulder  Right;Left    Right Shoulder Extension  50 Degrees    Right Shoulder Flexion  151 Degrees    Right Shoulder ABduction  141 Degrees    Right Shoulder Internal Rotation  57 Degrees    Right Shoulder External Rotation  79 Degrees    Left Shoulder Extension  51 Degrees    Left Shoulder Flexion  145 Degrees    Left Shoulder ABduction  156 Degrees    Left Shoulder Internal Rotation  85 Degrees    Left Shoulder External Rotation  86 Degrees      Strength   Overall Strength  Within functional limits for tasks performed        LYMPHEDEMA/ONCOLOGY QUESTIONNAIRE - 10/06/19 0948      Type   Cancer Type  Right breast cancer      Lymphedema Assessments   Lymphedema Assessments  Upper extremities      Right Upper Extremity Lymphedema   10 cm Proximal to Olecranon Process  33.3 cm    Olecranon Process  28 cm    10 cm Proximal to Ulnar Styloid Process  26.2 cm    Just Proximal to Ulnar Styloid Process  16.9 cm    Across Hand at [employer]   20.1 cm    At [place] of 2nd Digit  6.6 cm      Left Upper Extremity Lymphedema   10 cm Proximal to Olecranon Process  34.2 cm    Olecranon Process  27.8 cm    10 cm Proximal to Ulnar Styloid Process  26.2 cm    Just Proximal to Ulnar Styloid Process  17.4 cm    Across Hand at [employer]  19.4 cm    At [place] of 2nd Digit  6.4 cm          Quick Dash - 10/06/19 0001    Open a tight or new jar  No difficulty    Do heavy household chores (wash walls, wash floors)  No difficulty    Carry a shopping bag or briefcase  No difficulty    Wash your back  No difficulty    Use a knife to cut food  No difficulty    Recreational activities in which you take some force or impact through your arm, shoulder, or hand (golf, hammering, tennis)  No difficulty    During the past week, to what extent has your arm, shoulder or hand problem interfered with your normal social activities with family, friends, neighbors, or groups?  Not at all    During the past week, to what extent has your arm, shoulder or hand problem limited your work or other regular daily activities  Not at all    Arm, shoulder, or hand pain.  None    Tingling (pins and needles) in your arm, shoulder, or hand  None    Difficulty Sleeping  No difficulty    DASH Score  0 %        Objective measurements completed on examination: See above findings.     Patient was instructed today in a home exercise program today for post op shoulder range of motion. These included active assist shoulder flexion in sitting, scapular retraction, wall walking with shoulder abduction, and hands behind head external rotation.  She was encouraged to do these twice a day, holding 3 seconds and repeating 5 times when permitted by her physician.  PT Education - 10/06/19 0948    Education Details  Lymphedema risk reduction and post op shoulder ROM HEP    Person(s) Educated  Patient    Methods  Explanation;Demonstration;Handout    Comprehension   Returned demonstration;Verbalized understanding          PT Long Term Goals - 10/06/19 1047      PT LONG TERM GOAL #1   Title  Patient will demostrate she has regained full shoulder ROM post operatively compared to baseline assessments.    Time  8    Period  Weeks    Status  New    Target Date  12/01/19      Breast Clinic Goals - 10/06/19 1047      Patient will be able to verbalize understanding of pertinent lymphedema risk reduction practices relevant to her diagnosis specifically related to skin care.   Time  1    Period  Days    Status  Achieved      Patient will be able to return demonstrate and/or verbalize understanding of the post-op home exercise program related to regaining shoulder range of motion.   Time  1    Period  Days    Status  Achieved      Patient will be able to verbalize understanding of the importance of attending the postoperative After Breast Cancer Class for further lymphedema risk reduction education and therapeutic exercise.   Time  1    Period  Days    Status  Achieved            Plan - 10/06/19 0953    Clinical Impression Statement  Patient was diagnosed on 09/23/2019 with right grade III invasive ductal carcinoma breast cancer. It measures 5.2 cm and is located in the upper outer quadrant. It is ER/PR negative, HER2 positive with a Ki67 of 80%. She has multiple enlarged nodes with one biopsied and found to be positive. Her multidisciplinary medical team met prior to her assessments to determine a recommended treatment plan. She is planning to have neoadjuvant chemotherapy followed by a right mastectomy and axillary lymph node dissection, and radiation. She will benefit from a post op PT reassessment to determine needs. She will then undergo L-Dex screenings every 3 months for 2 years to closely monitor for subclinical lymphedema.    Stability/Clinical Decision Making  Stable/Uncomplicated    Clinical Decision Making  Low    PT  Treatment/Interventions  ADLs/Self Care Home Management;Therapeutic exercise;Patient/family education    PT Next Visit Plan  Will reassess 3-4 weeks post op to determine needs    PT Home Exercise Plan  Post op shoulder ROM HEP    Consulted and Agree with Plan of Care  Patient       Patient will benefit from skilled therapeutic intervention in order to improve the following deficits and impairments:  Decreased knowledge of precautions, Postural dysfunction, Decreased range of motion, Pain, Impaired UE functional use  Visit Diagnosis: Malignant neoplasm of upper-outer quadrant of right breast in female, estrogen receptor negative ([place]) - Plan: PT plan of care cert/re-cert  Abnormal posture - Plan: PT plan of care cert/re-cert   Patient will follow up at outpatient cancer rehab 3-4 weeks following surgery.  If the patient requires physical therapy at that time, a specific plan will be dictated and sent to the referring physician for approval. The patient was educated today on appropriate basic range of motion exercises to begin post operatively and the importance of attending the  After Breast Cancer class following surgery.  Patient was educated today on lymphedema risk reduction practices as it pertains to recommendations that will benefit the patient immediately following surgery.  She verbalized good understanding.    The patient was assessed using the L-Dex machine today to produce a lymphedema index baseline score. The patient will be reassessed on a regular basis (typically every 3 months) to obtain new L-Dex scores. If the score is > 6.5 points away from his/her baseline score indicating onset of subclinical lymphedema, it will be recommended to wear a compression garment for 4 weeks, 12 hours per day and then be reassessed. If the score continues to be > 6.5 points from baseline at reassessment, we will initiate lymphedema treatment. Assessing in this manner has a 95% rate of preventing  clinically significant lymphedema.    Problem List Patient Active Problem List   Diagnosis Date Noted  . Malignant neoplasm of upper-outer quadrant of right breast in female, estrogen receptor positive ([place]) 10/05/2019   Annia Friendly, PT 10/06/19 10:51 AM  [facility] [place], [place], [address] Phone: [phone]   Fax:  [phone]  Name: Krysta Bloomfield MRN: [id number] Date of Birth: 1975-07-10

## 2019-10-07 NOTE — Progress Notes (Signed)
Center Psychosocial Distress Screening Clinical Social Work  Clinical Social Work was referred by distress screening protocol.  The patient scored a 1 on the Psychosocial Distress Thermometer which indicates mild distress. Counseling intern met with patient in exam room with an interpreter present to assess for distress and other psychosocial needs.   [facility] DISTRESS SCREENING 10/07/2019  Screening Type Initial Screening  Distress experienced in past week (1-10) 1  Practical problem type Work/school;Transportation  Referral to clinical social work Yes  Referral to support programs Yes  Other Spanish speaking (requires interpreter) so can't take advantage of support programming; referral for a spanish speaking Alight Guide completed on 10/06/19   [facility] DISTRESS SCREENING 10/07/2019  Screening Type   Distress experienced in past week (1-10) 1  Practical problem type Transportation;Work/school  Referral to clinical social work   Referral to support programs   Other    Patient said she is worried about the cancer, but she is concerned about not being able to work during her cancer treatment. Additionally, pt needs help with transportation to and from appointments.  Clinical Social Worker follow up needed: Yes, to follow up with transportation needs and possible financial needs related to lost income during treatment.  [facility] [place] Counseling Intern Voicemail:  [phone]

## 2019-10-08 ENCOUNTER — Telehealth: Payer: Self-pay | Admitting: Oncology

## 2019-10-08 NOTE — Telephone Encounter (Cosign Needed)
Scheduled appts per 4/28 los. Spanish interpreter Almyra Free will call patient and inform her of next appt. Pt to get appt calendar at next visit per appt notes.

## 2019-10-12 ENCOUNTER — Other Ambulatory Visit: Payer: Self-pay | Admitting: Radiology

## 2019-10-13 ENCOUNTER — Ambulatory Visit (HOSPITAL_COMMUNITY)
Admission: RE | Admit: 2019-10-13 | Discharge: 2019-10-13 | Disposition: A | Discharge status: Home / Self Care | Payer: Medicaid Other | Source: Ambulatory Visit | Attending: Oncology | Admitting: Oncology

## 2019-10-13 ENCOUNTER — Encounter (HOSPITAL_COMMUNITY): Payer: Self-pay

## 2019-10-13 ENCOUNTER — Other Ambulatory Visit: Payer: Self-pay

## 2019-10-13 ENCOUNTER — Other Ambulatory Visit: Payer: Self-pay | Admitting: Oncology

## 2019-10-13 DIAGNOSIS — C50411 Malignant neoplasm of upper-outer quadrant of right female breast: Secondary | ICD-10-CM | POA: Insufficient documentation

## 2019-10-13 DIAGNOSIS — Z170 Estrogen receptor positive status [ER+]: Secondary | ICD-10-CM | POA: Diagnosis not present

## 2019-10-13 DIAGNOSIS — Z79899 Other long term (current) drug therapy: Secondary | ICD-10-CM | POA: Insufficient documentation

## 2019-10-13 DIAGNOSIS — Z452 Encounter for adjustment and management of vascular access device: Secondary | ICD-10-CM | POA: Diagnosis not present

## 2019-10-13 DIAGNOSIS — I10 Essential (primary) hypertension: Secondary | ICD-10-CM | POA: Insufficient documentation

## 2019-10-13 HISTORY — PX: IR IMAGING GUIDED PORT INSERTION: IMG5740

## 2019-10-13 LAB — CBC WITH DIFFERENTIAL/PLATELET
Abs Immature Granulocytes: 0.03 10*3/uL (ref 0.00–0.07)
Basophils Absolute: 0.1 10*3/uL (ref 0.0–0.1)
Basophils Relative: 1 %
Eosinophils Absolute: 0.2 10*3/uL (ref 0.0–0.5)
Eosinophils Relative: 3 %
HCT: 43.4 % (ref 36.0–46.0)
Hemoglobin: 13.7 g/dL (ref 12.0–15.0)
Immature Granulocytes: 0 %
Lymphocytes Relative: 32 %
Lymphs Abs: 2.4 10*3/uL (ref 0.7–4.0)
MCH: 28.9 pg (ref 26.0–34.0)
MCHC: 31.6 g/dL (ref 30.0–36.0)
MCV: 91.6 fL (ref 80.0–100.0)
Monocytes Absolute: 0.6 10*3/uL (ref 0.1–1.0)
Monocytes Relative: 8 %
Neutro Abs: 4.1 10*3/uL (ref 1.7–7.7)
Neutrophils Relative %: 56 %
Platelets: 407 10*3/uL — ABNORMAL HIGH (ref 150–400)
RBC: 4.74 MIL/uL (ref 3.87–5.11)
RDW: 12.3 % (ref 11.5–15.5)
WBC: 7.3 10*3/uL (ref 4.0–10.5)
nRBC: 0 % (ref 0.0–0.2)

## 2019-10-13 LAB — PROTIME-INR
INR: 1 (ref 0.8–1.2)
Prothrombin Time: 12.3 seconds (ref 11.4–15.2)

## 2019-10-13 LAB — HCG, SERUM, QUALITATIVE: Preg, Serum: NEGATIVE

## 2019-10-13 MED ORDER — CEFAZOLIN SODIUM-DEXTROSE 2-4 GM/100ML-% IV SOLN: 2.0000 g | INTRAVENOUS | Status: AC

## 2019-10-13 MED ORDER — SODIUM CHLORIDE 0.9 % IV SOLN: INTRAVENOUS | Status: DC

## 2019-10-13 MED ORDER — MIDAZOLAM HCL 2 MG/2ML IJ SOLN
INTRAMUSCULAR | Status: AC | PRN
  Administered 2019-10-13 (×4): 1 mg via INTRAVENOUS

## 2019-10-13 MED ORDER — FENTANYL CITRATE (PF) 100 MCG/2ML IJ SOLN
INTRAMUSCULAR | Status: AC
  Filled 2019-10-13: qty 2

## 2019-10-13 MED ORDER — FENTANYL CITRATE (PF) 100 MCG/2ML IJ SOLN
INTRAMUSCULAR | Status: AC | PRN
  Administered 2019-10-13 (×2): 50 ug via INTRAVENOUS

## 2019-10-13 MED ORDER — CEFAZOLIN SODIUM-DEXTROSE 2-4 GM/100ML-% IV SOLN
INTRAVENOUS | Status: AC
  Administered 2019-10-13: 2 g via INTRAVENOUS
  Filled 2019-10-13: qty 100

## 2019-10-13 MED ORDER — MIDAZOLAM HCL 2 MG/2ML IJ SOLN
INTRAMUSCULAR | Status: AC
  Filled 2019-10-13: qty 4

## 2019-10-13 MED ORDER — LIDOCAINE-EPINEPHRINE 1 %-1:100000 IJ SOLN
INTRAMUSCULAR | Status: AC | PRN
  Administered 2019-10-13 (×2): 10 mL via INTRADERMAL

## 2019-10-13 MED ORDER — HEPARIN SOD (PORK) LOCK FLUSH 100 UNIT/ML IV SOLN
INTRAVENOUS | Status: AC
  Filled 2019-10-13: qty 5

## 2019-10-13 MED ORDER — LIDOCAINE-EPINEPHRINE 1 %-1:100000 IJ SOLN
INTRAMUSCULAR | Status: AC
  Filled 2019-10-13: qty 1

## 2019-10-13 NOTE — Discharge Instructions (Signed)
Insercin del dispositivo de perfusin implantable, cuidados posteriores Implanted Port Insertion, Care After Esta hoja le brinda informacin sobre cmo cuidarse despus del procedimiento. El mdico tambin podr darle indicaciones ms especficas. Comunquese con el mdico si tiene problemas o preguntas. Qu puedo esperar despus del procedimiento? Despus del procedimiento, es comn tener los siguientes sntomas:  Molestias en el lugar de la insercin del dispositivo.  Moretones en la piel alrededor del dispositivo. Esto debera mejorar luego de 3 o 4 das. Siga estas indicaciones en su casa: Cuidado del dispositivo  Luego de que le coloquen el dispositivo, le darn una tarjeta de informacin del fabricante. La tarjeta contiene informacin acerca del dispositivo. Llvela siempre con usted.  Cuide el dispositivo como se lo haya indicado el mdico. Pregntele al mdico si usted o un familiar puede recibir capacitacin para cuidar del dispositivo en casa. Un enfermero a domicilio tambin puede cuidar del dispositivo.  Asegrese de recordar el tipo de dispositivo que tiene. Cuidados de las incisiones      Siga las indicaciones del mdico acerca de los cuidados del lugar de la insercin del dispositivo. Asegrese de hacer lo siguiente: ? Lvese las manos con agua y jabn antes y despus de cambiar la venda (vendaje). Use desinfectante para manos si no dispone de agua y jabn. ? Cambie el vendaje como se lo haya indicado el mdico. ? No retire los puntos (suturas), la goma para cerrar la piel o las tiras adhesivas. Es posible que estos cierres cutneos deban permanecer en la piel durante 2semanas o ms tiempo. Si los bordes de las tiras adhesivas empiezan a despegarse y enroscarse, puede recortar los que estn sueltos. No retire las tiras adhesivas por completo a menos que el mdico se lo indique.  Controle el lugar de insercin del dispositivo todos los das para detectar signos de  infeccin. Est atento a los siguientes signos: ? Enrojecimiento, hinchazn o dolor. ? Lquido o sangre. ? Calor. ? Pus o mal olor. Actividad  Retome sus actividades normales como se lo haya indicado el mdico. Pregntele al mdico qu actividades son seguras para usted.  No levante ningn objeto que pese ms de 10libras (4.5kg) o que supere el lmite de peso que le hayan indicado, hasta que el mdico le diga que puede hacerlo. Indicaciones generales  Tome los medicamentos de venta libre y los recetados solamente como se lo haya indicado el mdico.  No tome baos de inmersin, no nade ni use el jacuzzi hasta que el mdico lo autorice. Pregntele al mdico si puede ducharse. Tal vez solo le permitan darse baos de esponja.  No conduzca durante 24horas si le administraron un sedante durante el procedimiento.  Use un brazalete de alerta mdico en caso de emergencia. Esto permitir que cualquier mdico que lo atienda sepa que tiene un dispositivo.  Concurra a todas las visitas de seguimiento como se lo haya indicado el mdico. Esto es importante. Comunquese con un mdico si:  No puede purgar el dispositivo con solucin salina como se le indic, o no puede extraer sangre del dispositivo.  Tiene fiebre o escalofros.  Tiene enrojecimiento, hinchazn o dolor alrededor del lugar de la insercin del dispositivo.  Le sale lquido o sangre del lugar de la insercin del dispositivo.  El lugar de la insercin del dispositivo est caliente al tacto.  Tiene pus o percibe mal olor que proviene del lugar de la insercin del dispositivo. Solicite ayuda inmediatamente si:  Siente falta de aire o dolor en el pecho.  Tiene   hemorragia proveniente del lugar donde tiene el dispositivo y no puede controlarla. Resumen  Cuide el dispositivo como se lo haya indicado el mdico. Tenga la tarjeta informacin del fabricante con usted en todo momento.  Cambie el vendaje como se lo haya indicado el  mdico.  Comunquese con un mdico si tiene fiebre o escalofros o si tiene enrojecimiento, hinchazn o dolor alrededor del lugar de insercin del dispositivo.  Concurra a todas las visitas de seguimiento como se lo haya indicado el mdico. Esta informacin no tiene como fin reemplazar el consejo del mdico. Asegrese de hacerle al mdico cualquier pregunta que tenga. Document Revised: 01/29/2018 Document Reviewed: 01/29/2018 Elsevier Patient Education  2020 Elsevier Inc. Gua de cuidados en el hogar del dispositivo de perfusin implantable Implanted [place] Guide Un dispositivo de perfusin implantable es un dispositivo que se coloca debajo de la piel. Por lo general, se coloca en el pecho. Puede usarse para suministrar medicamentos intravenosos, tomar muestras de sangre o realizar dilisis. Puede tener un dispositivo implantable si:  Necesita administrarse un medicamento intravenoso que podra provocar una irritacin en las venas pequeas de las manos o los brazos.  Necesita medicamentos por va intravenosa a largo plazo, como antibiticos.  Necesita recibir nutricin por va intravenosa durante un largo perodo.  Debe someterse a dilisis. Si tiene un dispositivo, su mdico no tendr que recurrir a las venas de sus brazos para estos procedimientos. Puede tener algunas limitaciones ms al usar el dispositivo que si usara otro tipo de vas intravenosas a largo plazo, y es probable que pueda retomar sus actividades normales cuando cure la incisin. Un dispositivo de perfusin implantable tiene dos partes principales:  Depsito. El depsito es la parte en donde se inserta la aguja para administrar los medicamentos o extraer sangre. El depsito es redondo. Luego de colocado, se ve como un rea pequea y elevada debajo de su piel.  Catter. El catter es un tubo delgado y flexible que conecta el depsito a una vena. Los medicamentos que se introducen en el depsito, pasan a travs del catter  y luego llegan a la vena. Cmo se accede al dispositivo de perfusin? Para acceder al dispositivo:  Puede que se le coloque crema anestsica sobre la piel encima del dispositivo.  El mdico se colocar una mscara y guantes estriles.  La piel sobre el dispositivo se limpiar con cuidado con un jabn antisptico y se dejar secar.  Su mdico pellizcar suavemente el dispositivo e insertar una aguja dentro.  Su mdico revisar el retorno de sangre para garantizar que el dispositivo est en la vena y no est obstruido.  Si el dispositivo debe tener accesibilidad para un suministro continuo de medicamentos (infusin constante), su mdico colocar un vendaje (venda) claro sobre el lugar donde se coloc la aguja. El vendaje y la aguja debern cambiarse todas las semanas o segn las indicaciones del mdico. Qu es el purgado? El purgado ayuda a que el dispositivo no se obstruya. Siga las indicaciones del mdico acerca de cmo y cundo purgar el dispositivo. Los dispositivos de perfusin generalmente se purgan con una solucin salina o un medicamento llamado heparina. La necesidad de purgar depender de cmo se use el dispositivo:  Si se usa solo eventualmente para suministrar medicamentos o extraer sangre, se debe purgar el dispositivo: ? Antes y despus de la administracin de los medicamentos. ? Antes y despus de una extraccin de sangre. ? Como parte de una rutina de mantenimiento. Es recomendable que se purgue cada 4 o 6   semanas.  Si la infusin es constante, no necesitar limpiarlo.  Deseche las agujas y las jeringas en un contenedor para desechos destinado a objetos punzantes(recipiente para objetos punzantes). Puede comprar un recipiente para objetos punzantes en una farmacia, o puede hacer uno usted mismo con una botella de plstico rgida y un cobertor. Durante cunto tiempo tendr implantado el dispositivo? El dispositivo puede permanecer implantado por el tiempo que el mdico  considere necesario. Cuando llega el momento de retirar el dispositivo, deber someterse a una ciruga. Esta ciruga ser similar a la realizada en el momento de la colocacin del dispositivo. Siga estas indicaciones en su casa:   Purgue el dispositivo como se lo haya indicado el mdico.  Si debe recibir una infusin durante varios das, siga las indicaciones del mdico acerca de cmo cuidar el lugar donde tiene colocado el dispositivo. Haga lo siguiente: ? Lvese las manos con agua y jabn antes de cambiar el apsito. Use desinfectante para manos con alcohol si no dispone de agua y jabn. ? Cambie el vendaje como se lo haya indicado el mdico. ? Coloque vendas usadas o bolsas de infusin en una bolsa plstica. Tire esa bolsa en el contenedor de basura. ? Mantenga el vendaje que cubre la aguja limpio y seco. No deje que se moje. ? No use tijeras u objetos punzantes cerca del tubo. ? Mantenga el tubo sujetado, excepto cuando se use.  Controle el lugar donde tiene colocado el dispositivo todos los das para detectar signos de infeccin. Est atento a los siguientes signos: ? Dolor, hinchazn o enrojecimiento. ? Lquido o sangre. ? Pus o mal olor.  Proteja la piel alrededor del lugar donde tiene colocado el dispositivo. ? Evite usar sostenes si los breteles rozan o irritan el lugar. ? Proteja la piel alrededor del dispositivo cuando se coloque el cinturn de seguridad. Coloque una almohadilla suave en el pecho, de ser necesario.  Dchese o bese como se lo haya indicado el mdico. Se puede mojar el lugar siempre y cuando no est recibiendo activamente una infusin.  Retome sus actividades normales como se lo haya indicado el mdico. Pregntele al mdico qu actividades son seguras para usted.  Utilice un brazalete o lleve consigo una tarjeta de alerta mdica en todo momento. Esto les permitir a los mdicos saber que tiene un dispositivo implantable en caso de una emergencia. Solicite ayuda  de inmediato si:  Tiene enrojecimiento, hinchazn o dolor en el lugar donde tiene el dispositivo.  Le sale una mayor cantidad de lquido o de sangre del lugar donde tiene el dispositivo.  Tiene pus o percibe mal olor que proviene del lugar donde tiene colocado el dispositivo.  Tiene fiebre. Resumen  Por lo general, los dispositivos implantados se colocan en el pecho para un acceso intravenoso a largo plazo.  Siga las indicaciones del mdico sobre cmo purgar el dispositivo y cambiar las vendas (vendajes).  Cuide el rea alrededor de donde tiene colocado el dispositivo, evite la ropa que presione el rea, y preste atencin a los signos de infeccin.  Proteja la piel alrededor del dispositivo cuando se coloque el cinturn de seguridad. Coloque una almohadilla suave en el pecho, de ser necesario.  Busque ayuda de inmediato si tiene fiebre o enrojecimiento, hinchazn, dolor, drenaje o mal olor en el lugar donde tiene el dispositivo. Esta informacin no tiene como fin reemplazar el consejo del mdico. Asegrese de hacerle al mdico cualquier pregunta que tenga. Document Revised: 02/27/2017 Document Reviewed: 02/27/2017 Elsevier Patient Education  2020 Elsevier   Inc. Sedacin consciente moderada en los adultos, cuidados posteriores (Moderate Conscious Sedation, Adult, Care After) Estas indicaciones le proporcionan informacin acerca de cmo deber cuidarse despus del procedimiento. El mdico tambin podr darle instrucciones ms especficas. El tratamiento ha sido planificado segn las prcticas mdicas actuales, pero en algunos casos pueden ocurrir problemas. Comunquese con el mdico si tiene algn problema o dudas despus del procedimiento. QU ESPERAR DESPUS DEL PROCEDIMIENTO Despus del procedimiento, es comn:  Sentirse somnoliento durante varias horas.  Sentirse torpe y tener problemas de equilibrio durante varias horas.  Perder el sentido de la realidad durante varias  horas.  Vomitar si come muy pronto. INSTRUCCIONES PARA EL CUIDADO EN EL HOGAR  Durante al menos 24horas despus del procedimiento:  No haga lo siguiente: ? Participar en actividades que impliquen posibles cadas o lesiones. ? Conducir vehculos. ? Operar maquinarias pesadas. ? Beber alcohol. ? Tomar somnferos o medicamentos que causen somnolencia. ? Firmar documentos legales ni tomar decisiones importantes. ? Cuidar a nios por su cuenta.  Hacer reposo. Comida y bebida  Siga la dieta recomendada por el mdico.  Si vomita: ? Pruebe agua, jugo o sopa cuando usted pueda beber sin vomitar. ? Asegrese de no tener nuseas antes de ingerir alimentos slidos. Instrucciones generales  Permanezca con un adulto responsable hasta que est completamente despierto y consciente.  Tome los medicamentos de venta libre y los recetados solamente como se lo haya indicado el mdico.  Si fuma, no lo haga sin supervisin.  Concurra a todas las visitas de control como se lo haya indicado el mdico. Esto es importante. SOLICITE ATENCIN MDICA SI:  Sigue teniendo nuseas o vomitando.  Tiene sensacin de desvanecimiento.  Le aparece una erupcin cutnea.  Tiene fiebre. SOLICITE ATENCIN MDICA DE INMEDIATO SI:  Tiene dificultad para respirar. Esta informacin no tiene como fin reemplazar el consejo del mdico. Asegrese de hacerle al mdico cualquier pregunta que tenga. Document Revised: 02/01/2016 Document Reviewed: 09/16/2015 Elsevier Patient Education  2020 Elsevier Inc.  

## 2019-10-13 NOTE — Progress Notes (Signed)
Pharmacist Chemotherapy Monitoring - Initial Assessment    Anticipated start date: 10/19/2019   Regimen:  . Are orders appropriate based on the patient's diagnosis, regimen, and cycle? Yes . Does the plan date match the patient's scheduled date? Yes . Is the sequencing of drugs appropriate? Yes . Are the premedications appropriate for the patient's regimen? Yes . Prior Authorization for treatment is: Approved o If applicable, is the correct biosimilar selected based on the patient's insurance? yes  Organ Function and Labs: Marland Kitchen Are dose adjustments needed based on the patient's renal function, hepatic function, or hematologic function? Yes . Are appropriate labs ordered prior to the start of patient's treatment? Yes . Other organ system assessment, if indicated: trastuzumab: Echo/ MUGA and pertuzumab: Echo/ MUGA . The following baseline labs, if indicated, have been ordered: N/A  Dose Assessment: . Are the drug doses appropriate? Yes . Are the following correct: o Drug concentrations Yes o IV fluid compatible with drug Yes o Administration routes Yes o Timing of therapy Yes . If applicable, does the patient have documented access for treatment and/or plans for port-a-cath placement? yes . If applicable, have lifetime cumulative doses been properly documented and assessed? not applicable Lifetime Dose Tracking  No doses have been documented on this patient for the following tracked chemicals: Doxorubicin, Epirubicin, Idarubicin, Daunorubicin, Mitoxantrone, Bleomycin, Oxaliplatin, Carboplatin, Liposomal Doxorubicin  o   Toxicity Monitoring/Prevention: . The patient has the following take home antiemetics prescribed: Prochlorperazine . The patient has the following take home medications prescribed: N/A . Medication allergies and previous infusion related reactions, if applicable, have been reviewed and addressed. No . The patient's current medication list has been assessed for drug-drug  interactions with their chemotherapy regimen. no significant drug-drug interactions were identified on review.  Order Review: . Are the treatment plan orders signed? Yes . Is the patient scheduled to see a provider prior to their treatment? Yes  I verify that I have reviewed each item in the above checklist and answered each question accordingly.  Kathy Wares D 10/13/2019 4:14 PM

## 2019-10-13 NOTE — Procedures (Signed)
Interventional Radiology Procedure Note  Procedure: LT IJ POWER PORT  Complications: None  Estimated Blood Loss: MIN  Findings: TIP SVCRA      

## 2019-10-13 NOTE — Consult Note (Signed)
Chief Complaint: Patient was seen in consultation today for Port-A-Cath placement  Referring Physician(s): Chauncey Cruel  Supervising Physician: Daryll Brod  Patient Status: [facility] - Out-pt  History of Present Illness: Alice [name] is a 44 y.o. female with history of newly diagnosed metastatic right breast carcinoma who presents today for Port-A-Cath placement for chemotherapy.  Past Medical History:  Diagnosis Date  . Hypertension     Past Surgical History:  Procedure Laterality Date  . CHOLECYSTECTOMY      Allergies: Patient has no known allergies.  Medications: Prior to Admission medications   Medication Sig Start Date End Date Taking? Authorizing Provider  dexamethasone (DECADRON) 4 MG tablet Take 2 tablets (8 mg total) by mouth 2 (two) times daily. Start the day before Taxotere. Take once the day after, then 2 times a day x 2d. 10/06/19   Magrinat, Virgie Dad, MD  ibuprofen (ADVIL,MOTRIN) 200 MG tablet Take 400 mg by mouth every 6 (six) hours as needed for fever, headache or cramping.     [provider]  lidocaine-prilocaine (EMLA) cream Apply to affected area once 10/06/19   Magrinat, Virgie Dad, MD  loratadine (CLARITIN) 10 MG tablet Take 1 tablet (10 mg total) by mouth daily. 10/06/19   Magrinat, Virgie Dad, MD  LORazepam (ATIVAN) 0.5 MG tablet Take 1 tablet (0.5 mg total) by mouth at bedtime as needed (Nausea or vomiting). 10/06/19   Magrinat, Virgie Dad, MD  prochlorperazine (COMPAZINE) 10 MG tablet Take 1 tablet (10 mg total) by mouth every 6 (six) hours as needed (Nausea or vomiting). 10/06/19   Magrinat, Virgie Dad, MD     No family history on file.  Social History   Socioeconomic History  . Marital status: Married    Spouse name: Not on file  . Number of children: Not on file  . Years of education: Not on file  . Highest education level: Not on file  Occupational History  . Not on file  Tobacco Use  . Smoking status: Never Smoker  Substance and  Sexual Activity  . Alcohol use: Never  . Drug use: Never  . Sexual activity: Not on file  Other Topics Concern  . Not on file  Social History Narrative  . Not on file   Social Determinants of Health   Financial Resource Strain:   . Difficulty of Paying Living Expenses:   Food Insecurity:   . Worried About Charity fundraiser in the Last Year:   . Arboriculturist in the Last Year:   Transportation Needs:   . Film/video editor (Medical):   Marland Kitchen Lack of Transportation (Non-Medical):   Physical Activity:   . Days of Exercise per Week:   . Minutes of Exercise per Session:   Stress:   . Feeling of Stress :   Social Connections:   . Frequency of Communication with Friends and Family:   . Frequency of Social Gatherings with Friends and Family:   . Attends Religious Services:   . Active Member of Clubs or Organizations:   . Attends Archivist Meetings:   Marland Kitchen Marital Status:       Review of Systems currently denies fever, headache, chest pain, dyspnea, cough, abdominal/back pain, nausea, vomiting or bleeding.  Vital Signs: Blood pressure 164/89, temp 98.5, heart rate 91, respirations 18, O2 sat 100% room air   Physical Exam awake, alert.  Chest clear to auscultation bilaterally.  Heart with regular rate and rhythm.  Abdomen soft, positive bowel  sounds, nontender.  No lower extremity edema.  Imaging: No results found.  Labs:  CBC: No results for input(s): WBC, HGB, HCT, PLT in the last 8760 hours.  COAGS: No results for input(s): INR, APTT in the last 8760 hours.  BMP: Recent Labs    10/06/19 0826  NA 138  K 3.8  CL 104  CO2 24  GLUCOSE 117*  BUN 11  CALCIUM 9.3  CREATININE 0.79  GFRNONAA >60  GFRAA >60    LIVER FUNCTION TESTS: Recent Labs    10/06/19 0826  BILITOT 0.7  AST 26  ALT 47*  ALKPHOS 113  PROT 7.8  ALBUMIN 3.9    TUMOR MARKERS: No results for input(s): AFPTM, CEA, CA199, CHROMGRNA in the last 8760 hours.  Assessment and  Plan: 44 y.o. female with history of newly diagnosed metastatic right breast carcinoma who presents today for Port-A-Cath placement for chemotherapy.Risks and benefits of image guided port-a-catheter placement was discussed with the patient via interpreter including, but not limited to bleeding, infection, pneumothorax, or fibrin sheath development and need for additional procedures.  All of the patient's questions were answered, patient is agreeable to proceed. Consent signed and in chart.  LABS PENDING   Thank you for this interesting consult.  I greatly enjoyed meeting Indu Goyette and look forward to participating in their care.  A copy of this report was sent to the requesting provider on this date.  Electronically Signed: D. Rowe Robert, PA-C 10/13/2019, 1:20 PM   I spent a total of 25 minutes  in face to face in clinical consultation, greater than 50% of which was counseling/coordinating care for Port-A-Cath placement

## 2019-10-14 ENCOUNTER — Telehealth: Payer: Self-pay | Admitting: Licensed Clinical Social Worker

## 2019-10-14 ENCOUNTER — Ambulatory Visit: Payer: Self-pay | Admitting: Licensed Clinical Social Worker

## 2019-10-14 ENCOUNTER — Encounter: Payer: Self-pay | Admitting: *Deleted

## 2019-10-14 ENCOUNTER — Encounter: Payer: Self-pay | Admitting: Licensed Clinical Social Worker

## 2019-10-14 DIAGNOSIS — C50411 Malignant neoplasm of upper-outer quadrant of right female breast: Secondary | ICD-10-CM

## 2019-10-14 DIAGNOSIS — Z1379 Encounter for other screening for genetic and chromosomal anomalies: Secondary | ICD-10-CM

## 2019-10-14 DIAGNOSIS — Z170 Estrogen receptor positive status [ER+]: Secondary | ICD-10-CM

## 2019-10-14 NOTE — Telephone Encounter (Signed)
Attempted to call [name] with Spanish interpreter with genetic test results. Home number is no longer in service, and mobile phone number had a response that said not taking calls at this time, could not leave voicemail. Will attempt again later today.

## 2019-10-14 NOTE — Progress Notes (Signed)
HPI:  Alice [name] was previously seen in the [place] [facility] due to a personal history of cancer and concerns regarding a hereditary predisposition to cancer. Please refer to our prior cancer genetics clinic note for more information regarding our discussion, assessment and recommendations, at the time. Alice [name] recent genetic test results were disclosed to her, as were recommendations warranted by these results. These results and recommendations are discussed in more detail below.  CANCER HISTORY:  Oncology History  Malignant neoplasm of upper-outer quadrant of right breast in female, estrogen receptor positive ([place])  10/05/2019 Initial Diagnosis   Malignant neoplasm of upper-outer quadrant of right breast in female, estrogen receptor positive ([place])   10/19/2019 -  Chemotherapy   The patient had palonosetron (ALOXI) injection 0.25 mg, 0.25 mg, Intravenous,  Once, 0 of 6 cycles pegfilgrastim-jmdb (FULPHILA) injection 6 mg, 6 mg, Subcutaneous,  Once, 0 of 6 cycles CARBOplatin (PARAPLATIN) 750 mg in sodium chloride 0.9 % 250 mL chemo infusion, 750 mg (100 % of original dose 750 mg), Intravenous,  Once, 0 of 6 cycles Dose modification:   (original dose 750 mg, Cycle 1) DOCEtaxel (TAXOTERE) 150 mg in sodium chloride 0.9 % 250 mL chemo infusion, 75 mg/m2 = 150 mg, Intravenous,  Once, 0 of 6 cycles fosaprepitant (EMEND) 150 mg in sodium chloride 0.9 % 145 mL IVPB, 150 mg, Intravenous,  Once, 0 of 6 cycles pertuzumab (PERJETA) 420 mg in sodium chloride 0.9 % 250 mL chemo infusion, 420 mg (50 % of original dose 840 mg), Intravenous, Once, 0 of 6 cycles Dose modification: 420 mg (original dose 840 mg, Cycle 1, Reason: Provider Judgment) trastuzumab-dkst (OGIVRI) 756 mg in sodium chloride 0.9 % 250 mL chemo infusion, 8 mg/kg = 756 mg, Intravenous,  Once, 0 of 6 cycles  for chemotherapy treatment.     Genetic Testing   Negative genetic testing. No pathogenic variants identified on the  [facility] Common Hereditary Cancers Panel. The report date is 10/13/2019.  The Common Hereditary Cancers Panel offered by [facility] includes sequencing and/or deletion duplication testing of the following 48 genes: APC, ATM, AXIN2, BARD1, BMPR1A, BRCA1, BRCA2, BRIP1, CDH1, CDKN2A (p14ARF), CDKN2A (p16INK4a), CKD4, CHEK2, CTNNA1, DICER1, EPCAM (Deletion/duplication testing only), GREM1 (promoter region deletion/duplication testing only), KIT, MEN1, MLH1, MSH2, MSH3, MSH6, MUTYH, NBN, NF1, NHTL1, PALB2, PDGFRA, PMS2, POLD1, POLE, PTEN, RAD50, RAD51C, RAD51D, RNF43, SDHB, SDHC, SDHD, SMAD4, SMARCA4. STK11, TP53, TSC1, TSC2, and VHL.  The following genes were evaluated for sequence changes only: SDHA and HOXB13 c.251G>A variant only.     FAMILY HISTORY:  We obtained a detailed, 4-generation family history.  Significant diagnoses are listed below: No family history on file.  Alice [name] has two sons, one is 24 and the other is 88. Patient has 2 brothers and 1 sister, all living, no cancer.   Patient has limited information about the rest of her family and does not know of any other cancers. Her mother is living at 40 and father is living at 1. Her mother had 38 siblings and father had many siblings as well. All grandparents are deceased over the age of 40.   Alice [name] is unaware of previous family history of genetic testing for hereditary cancer risks. Patient's maternal ancestors are of unknown descent, and paternal ancestors are of unknown descent. There is no reported Ashkenazi Jewish ancestry. There is no known consanguinity.  GENETIC TEST RESULTS: Genetic testing reported out on 10/13/2019 through the [facility] Common Hereditary cancer panel found no pathogenic  mutations. The Common Hereditary Cancers Panel offered by [facility] includes sequencing and/or deletion duplication testing of the following 48 genes: APC, ATM, AXIN2, BARD1, BMPR1A, BRCA1, BRCA2, BRIP1, CDH1, CDKN2A (p14ARF), CDKN2A (p16INK4a),  CKD4, CHEK2, CTNNA1, DICER1, EPCAM (Deletion/duplication testing only), GREM1 (promoter region deletion/duplication testing only), KIT, MEN1, MLH1, MSH2, MSH3, MSH6, MUTYH, NBN, NF1, NHTL1, PALB2, PDGFRA, PMS2, POLD1, POLE, PTEN, RAD50, RAD51C, RAD51D, RNF43, SDHB, SDHC, SDHD, SMAD4, SMARCA4. STK11, TP53, TSC1, TSC2, and VHL.  The following genes were evaluated for sequence changes only: SDHA and HOXB13 c.251G>A variant only. The test report has been scanned into [employer] and is located under the Molecular Pathology section of the Results Review tab.  A portion of the result report is included below for reference.     We discussed with Alice [name] that because current genetic testing is not perfect, it is possible there may be a gene mutation in one of these genes that current testing cannot detect, but that chance is small.  We also discussed, that there could be another gene that has not yet been discovered, or that we have not yet tested, that is responsible for the cancer diagnoses in the family. It is also possible there is a hereditary cause for the cancer in the family that Alice [name] did not inherit and therefore was not identified in her testing.  Therefore, it is important to remain in touch with cancer genetics in the future so that we can continue to offer Alice [name] the most up to date genetic testing.   ADDITIONAL GENETIC TESTING: We discussed with Alice [name] that her genetic testing was fairly extensive.  If there are genes identified to increase cancer risk that can be analyzed in the future, we would be happy to discuss and coordinate this testing at that time.    CANCER SCREENING RECOMMENDATIONS: Alice [name] test result is considered negative (normal).  This means that we have not identified a hereditary cause for her personal history of cancer at this time. Most cancers happen by chance and this negative test suggests that her cancer may fall into this category.    While reassuring,  this does not definitively rule out a hereditary predisposition to cancer. It is still possible that there could be genetic mutations that are undetectable by current technology. There could be genetic mutations in genes that have not been tested or identified to increase cancer risk.  Therefore, it is recommended she continue to follow the cancer management and screening guidelines provided by her oncology and primary healthcare provider.   An individual's cancer risk and medical management are not determined by genetic test results alone. Overall cancer risk assessment incorporates additional factors, including personal medical history, family history, and any available genetic information that may result in a personalized plan for cancer prevention and surveillance.  RECOMMENDATIONS FOR FAMILY MEMBERS:  Relatives in this family might be at some increased risk of developing cancer, over the general population risk, simply due to the family history of cancer.  We recommended female relatives in this family have a yearly mammogram beginning at age 44, or 78 years younger than the earliest onset of cancer, an annual clinical breast exam, and perform monthly breast self-exams. Female relatives in this family should also have a gynecological exam as recommended by their primary provider. All family members should have a colonoscopy by age 104, or as directed by their physicians.  FOLLOW-UP: Lastly, we discussed with Alice [name] that cancer genetics is a rapidly advancing field and  it is possible that new genetic tests will be appropriate for her and/or her family members in the future. We encouraged her to remain in contact with cancer genetics on an annual basis so we can update her personal and family histories and let her know of advances in cancer genetics that may benefit this family.   Our contact number was provided. Alice [name] questions were answered to her satisfaction, and she knows she is welcome  to call us at anytime with additional questions or concerns.   [name] Rogue, MS, [facility] Genetic Counselor [email or web address] Phone: [phone]

## 2019-10-14 NOTE — Progress Notes (Signed)
Almyra Free (interpreter) is reaching out to the patient to see if she any questions or concerns.  Due to multiple appointments, Almyra Free will also call patient the day before appointments to remind patient and answer any questions about location and times for appointments.  Almyra Free states she will let me know of any patient questions or concerns.

## 2019-10-14 NOTE — Progress Notes (Signed)
[facility]  [phone] [phone]) [phone]     ID: Alice [name] DOB: 1975-11-14  MR#: [id number]  TIW#:[id number]  Patient Care Team: Drue Flirt, MD as PCP - General (Family Medicine) Mauro Kaufmann, RN as Oncology Nurse Navigator Rockwell Germany, RN as Oncology Nurse Navigator Alphonsa Overall, MD as Consulting Physician (General Surgery) Abdoul Encinas, Virgie Dad, MD as Consulting Physician (Oncology) Kyung Rudd, MD as Consulting Physician (Radiation Oncology) Chauncey Cruel, MD OTHER MD:  CHIEF COMPLAINT: estrogen receptor negative, Her2 positive breast cancer  CURRENT TREATMENT: Neoadjuvant chemotherapy   INTERVAL HISTORY: Alice [name] returns today for follow up of her estrogen receptor negative, Her2 positive breast cancer, accompanied by a [name] interpreter. She was evaluated in the multidisciplinary breast cancer clinic on 10/06/2019.  Results from her genetic testing were negative.  She underwent port placement on 10/13/2019.  She tolerated this moderately well.  She has some soreness associated with this as expected.  She underwent staging scans earlier today consisting of CT chest/abdomen/pelvis, and bone scan.  There are multiple right axillary and subpectoral lymph nodes and a mass in the right breast measuring up to 6.9 cm.  There are no liver or lung or bone lesions of concern.  She is scheduled for breast MRI on 10/18/2019 and to start chemotherapy the following day.   REVIEW OF SYSTEMS: Alice [name] is trying to get some financial help through disability and requested some of her records today which I was glad to provide for her.  She has obtained all her medications and brought them in today.  Unfortunately all the instructions are in English but the interpreter today translated for her and wrote it down in Spanish so she can take her medications appropriately.  Aside from this a detailed review of systems was stable  HISTORY OF CURRENT  ILLNESS: From the original intake note:  Alice [name] presented with right breast pain with swelling and skin changes (duskiness and dimpling) and left breast pain at 6 o'clock. She underwent bilateral diagnostic mammography with tomography and bilateral breast ultrasonography at [facility] on 09/23/2019 showing: breast density category C; 5.2 cm irregular mass in right breast spanning 10-12 o'clock, with marked peau d'orange changes concerning for inflammatory component; 3.3 cm enlarged lymph node in right axilla; indeterminate 1 cm oval mass in left breast at 5 o'clock.  Accordingly on 09/30/2019 she proceeded to biopsy of the right breast area in question. The pathology from this procedure ([id number]) showed: invasive mammary carcinoma, grade 3, e-cadherin positive. Prognostic indicators significant for: estrogen receptor, 0% negative and progesterone receptor, 0% negative. Proliferation marker Ki67 at 80%. HER2 positive by immunohistochemistry (3+).  The biopsied right axillary lymph node was positive for metastatic carcinoma.  She also underwent cyst aspiration of the 1 cm mass in the left breast the same day.  The patient's subsequent history is as detailed below.   PAST MEDICAL HISTORY: Past Medical History:  Diagnosis Date   Hypertension     PAST SURGICAL HISTORY: Past Surgical History:  Procedure Laterality Date   CHOLECYSTECTOMY     IR IMAGING GUIDED PORT INSERTION  10/13/2019    FAMILY HISTORY: No family history on file.  As of 09/2019-- her father is 42 and her mother is 60. She has two brothers and one sister. There is no cancer in her family to her knowledge.    GYNECOLOGIC HISTORY:  Patient's last menstrual period was 09/22/2019. Menarche: 44 years old Age at first live birth: 44 years old GX P  2 LMP regular periods lasting 3 days Contraceptive: previously used, has not used for the last 2 years HRT n/a  Hysterectomy? no BSO? no   SOCIAL HISTORY: (updated  09/2019)  Alice [name] [place]  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 Husband [name] is currently unemployed but is a good Training and development officer. She lives at home with [name] and their two children. Son Alice [name], age 35, has special needs.  Son Alice [name], age 66, is in high school. She attends     ADVANCED DIRECTIVES: In the absence of any documentation to the contrary, the patient's spouse is their HCPOA.    HEALTH MAINTENANCE: Social History   Tobacco Use   Smoking status: Never Smoker  Substance Use Topics   Alcohol use: Never   Drug use: Never     Colonoscopy: n/a (age)  PAP: 2020  Bone density: n/a (age)   No Known Allergies  Current Outpatient Medications  Medication Sig Dispense Refill   dexamethasone (DECADRON) 4 MG tablet Take 2 tablets (8 mg total) by mouth 2 (two) times daily. Start the day before Taxotere. Take once the day after, then 2 times a day x 2d. 30 tablet 1   ibuprofen (ADVIL,MOTRIN) 200 MG tablet Take 400 mg by mouth every 6 (six) hours as needed for fever, headache or cramping.      lidocaine-prilocaine (EMLA) cream Apply to affected area once 30 g 3   lisinopril (ZESTRIL) 10 MG tablet Take 10 mg by mouth daily.     loratadine (CLARITIN) 10 MG tablet Take 1 tablet (10 mg total) by mouth daily. 60 tablet 1   LORazepam (ATIVAN) 0.5 MG tablet Take 1 tablet (0.5 mg total) by mouth at bedtime as needed (Nausea or vomiting). 20 tablet 0   prochlorperazine (COMPAZINE) 10 MG tablet Take 1 tablet (10 mg total) by mouth every 6 (six) hours as needed (Nausea or vomiting). 30 tablet 1   No current facility-administered medications for this visit.   Facility-Administered Medications Ordered in Other Visits  Medication Dose Route Frequency Provider Last Rate Last Admin   sodium chloride (PF) 0.9 % injection             OBJECTIVE: [place] woman in no acute distress  Vitals:   10/15/19 1157  BP: (!) 141/96  Pulse:  83  Resp: 18  Temp: 98.2 F (36.8 C)  SpO2: 99%     Body mass index is 35.1 kg/m.   Wt Readings from Last 3 Encounters:  10/15/19 204 lb 8 oz (92.8 kg)  10/06/19 205 lb 9.6 oz (93.3 kg)  06/19/18 200 lb (90.7 kg)      ECOG FS:1 - Symptomatic but completely ambulatory  Sclerae unicteric, EOMs intact Wearing a mask No cervical or supraclavicular adenopathy Lungs no rales or rhonchi Heart regular rate and rhythm Abd soft, nontender, positive bowel sounds MSK no focal spinal tenderness, no upper extremity lymphedema Neuro: nonfocal, well oriented, appropriate affect Breasts: On exam of the right breast as shown edema but no obvious erythema, and the large breast mass within the breast is movable.  There is palpable right axillary adenopathy.  The left breast is benign.  The left axilla is benign.  Right breast 10/06/2019     LAB RESULTS:  CMP     Component Value Date/Time   NA 138 10/06/2019 0826   K 3.8 10/06/2019 0826   CL 104 10/06/2019 0826   CO2 24 10/06/2019 0826  GLUCOSE 117 (H) 10/06/2019 0826   BUN 11 10/06/2019 0826   CREATININE 0.79 10/06/2019 0826   CALCIUM 9.3 10/06/2019 0826   PROT 7.8 10/06/2019 0826   ALBUMIN 3.9 10/06/2019 0826   AST 26 10/06/2019 0826   ALT 47 (H) 10/06/2019 0826   ALKPHOS 113 10/06/2019 0826   BILITOT 0.7 10/06/2019 0826   GFRNONAA >60 10/06/2019 0826   GFRAA >60 10/06/2019 0826    No results found for: TOTALPROTELP, ALBUMINELP, A1GS, A2GS, BETS, BETA2SER, GAMS, MSPIKE, SPEI  Lab Results  Component Value Date   WBC 7.3 10/13/2019   NEUTROABS 4.1 10/13/2019   HGB 13.7 10/13/2019   HCT 43.4 10/13/2019   MCV 91.6 10/13/2019   PLT 407 (H) 10/13/2019    No results found for: LABCA2  No components found for: GBTDVV616  Recent Labs  Lab 10/13/19 1355  INR 1.0    No results found for: LABCA2  No results found for: WVP710  No results found for: GYI948  No results found for: NIO270  No results found for:  CA2729  No components found for: HGQUANT  No results found for: CEA1 / No results found for: CEA1   No results found for: AFPTUMOR  No results found for: CHROMOGRNA  No results found for: KPAFRELGTCHN, LAMBDASER, KAPLAMBRATIO (kappa/lambda light chains)  No results found for: HGBA, HGBA2QUANT, HGBFQUANT, HGBSQUAN (Hemoglobinopathy evaluation)   No results found for: LDH  No results found for: IRON, TIBC, IRONPCTSAT (Iron and TIBC)  No results found for: FERRITIN  Urinalysis No results found for: COLORURINE, APPEARANCEUR, LABSPEC, PHURINE, GLUCOSEU, HGBUR, BILIRUBINUR, KETONESUR, PROTEINUR, UROBILINOGEN, NITRITE, LEUKOCYTESUR   STUDIES: CT Chest W Contrast  Result Date: 10/15/2019 CLINICAL DATA:  44 year old female with history of right-sided breast cancer diagnosed in April 2021. EXAM: CT CHEST, ABDOMEN, AND PELVIS WITH CONTRAST TECHNIQUE: Multidetector CT imaging of the chest, abdomen and pelvis was performed following the standard protocol during bolus administration of intravenous contrast. CONTRAST:  121m OMNIPAQUE IOHEXOL 300 MG/ML  SOLN COMPARISON:  No priors. FINDINGS: CT CHEST FINDINGS Cardiovascular: Heart size is normal. There is no significant pericardial fluid, thickening or pericardial calcification. No atherosclerotic calcifications in the thoracic aorta or the coronary arteries. Left-sided subclavian single-lumen porta cath with tip terminating right atrium. Mediastinum/Nodes: No pathologically enlarged mediastinal or hilar lymph nodes. Esophagus is unremarkable in appearance. No internal mammary lymphadenopathy. Numerous enlarged and borderline enlarged right axillary and subpectoral lymph nodes are noted, measuring up to 1.8 cm in short axis (axial image 15 of series 2). Lungs/Pleura: Tiny 2 mm calcified granuloma in the periphery of the right upper lobe. No other suspicious appearing pulmonary nodules or masses are noted. No acute consolidative airspace disease. No  pleural effusions. Musculoskeletal: Diffuse skin thickening in the right breast. Poorly defined infiltrative mass in the right breast estimated to measure approximately 6.9 x 4.0 x 3.9 cm (axial image 26 of series 2 and coronal image 45 of series 5). There are no aggressive appearing lytic or blastic lesions noted in the visualized portions of the skeleton. CT ABDOMEN PELVIS FINDINGS Hepatobiliary: No suspicious cystic or solid hepatic lesions. No intra or extrahepatic biliary ductal dilatation. Status post cholecystectomy. Pancreas: No pancreatic mass. No pancreatic ductal dilatation. No pancreatic or peripancreatic fluid collections or inflammatory changes. Spleen: Unremarkable. Adrenals/Urinary Tract: Bilateral kidneys and adrenal glands are normal in appearance. No hydroureteronephrosis. Urinary bladder is normal in appearance. Stomach/Bowel: Normal appearance of the stomach. No pathologic dilatation of small bowel or colon. Normal appendix. Vascular/Lymphatic: No significant  atherosclerotic disease, aneurysm or dissection noted in the abdominal or pelvic vasculature. No lymphadenopathy noted in the abdomen or pelvis. Reproductive: Uterus and ovaries are unremarkable in appearance. Other: No significant volume of ascites.  No pneumoperitoneum. Musculoskeletal: There are no aggressive appearing lytic or blastic lesions noted in the visualized portions of the skeleton. IMPRESSION: 1. Large heterogeneously enhancing poorly defined infiltrative right breast mass with diffuse skin thickening in the right breast, and right axillary/subpectoral lymphadenopathy. 2. No other sites of metastatic disease noted elsewhere in the chest, abdomen or pelvis. Electronically Signed   By: Vinnie Langton M.D.   On: 10/15/2019 11:40   CT Abdomen Pelvis W Contrast  Result Date: 10/15/2019 CLINICAL DATA:  44 year old female with history of right-sided breast cancer diagnosed in April 2021. EXAM: CT CHEST, ABDOMEN, AND PELVIS WITH  CONTRAST TECHNIQUE: Multidetector CT imaging of the chest, abdomen and pelvis was performed following the standard protocol during bolus administration of intravenous contrast. CONTRAST:  114m OMNIPAQUE IOHEXOL 300 MG/ML  SOLN COMPARISON:  No priors. FINDINGS: CT CHEST FINDINGS Cardiovascular: Heart size is normal. There is no significant pericardial fluid, thickening or pericardial calcification. No atherosclerotic calcifications in the thoracic aorta or the coronary arteries. Left-sided subclavian single-lumen porta cath with tip terminating right atrium. Mediastinum/Nodes: No pathologically enlarged mediastinal or hilar lymph nodes. Esophagus is unremarkable in appearance. No internal mammary lymphadenopathy. Numerous enlarged and borderline enlarged right axillary and subpectoral lymph nodes are noted, measuring up to 1.8 cm in short axis (axial image 15 of series 2). Lungs/Pleura: Tiny 2 mm calcified granuloma in the periphery of the right upper lobe. No other suspicious appearing pulmonary nodules or masses are noted. No acute consolidative airspace disease. No pleural effusions. Musculoskeletal: Diffuse skin thickening in the right breast. Poorly defined infiltrative mass in the right breast estimated to measure approximately 6.9 x 4.0 x 3.9 cm (axial image 26 of series 2 and coronal image 45 of series 5). There are no aggressive appearing lytic or blastic lesions noted in the visualized portions of the skeleton. CT ABDOMEN PELVIS FINDINGS Hepatobiliary: No suspicious cystic or solid hepatic lesions. No intra or extrahepatic biliary ductal dilatation. Status post cholecystectomy. Pancreas: No pancreatic mass. No pancreatic ductal dilatation. No pancreatic or peripancreatic fluid collections or inflammatory changes. Spleen: Unremarkable. Adrenals/Urinary Tract: Bilateral kidneys and adrenal glands are normal in appearance. No hydroureteronephrosis. Urinary bladder is normal in appearance. Stomach/Bowel:  Normal appearance of the stomach. No pathologic dilatation of small bowel or colon. Normal appendix. Vascular/Lymphatic: No significant atherosclerotic disease, aneurysm or dissection noted in the abdominal or pelvic vasculature. No lymphadenopathy noted in the abdomen or pelvis. Reproductive: Uterus and ovaries are unremarkable in appearance. Other: No significant volume of ascites.  No pneumoperitoneum. Musculoskeletal: There are no aggressive appearing lytic or blastic lesions noted in the visualized portions of the skeleton. IMPRESSION: 1. Large heterogeneously enhancing poorly defined infiltrative right breast mass with diffuse skin thickening in the right breast, and right axillary/subpectoral lymphadenopathy. 2. No other sites of metastatic disease noted elsewhere in the chest, abdomen or pelvis. Electronically Signed   By: DVinnie LangtonM.D.   On: 10/15/2019 11:40   IR IMAGING GUIDED PORT INSERTION  Result Date: 10/13/2019 CLINICAL DATA:  METASTATIC BREAST CANCER EXAM: LEFT INTERNAL JUGULAR SINGLE LUMEN POWER PORT CATHETER INSERTION Date:  10/13/2019 10/13/2019 3:45 pm Radiologist:  MJerilynn Mages TDaryll Brod MD Guidance:  Ultrasound and fluoroscopic MEDICATIONS: Ancef 2 g; The antibiotic was administered within an appropriate time interval prior to skin puncture. ANESTHESIA/SEDATION: Versed  4.0 mg IV; Fentanyl 100 mcg IV; Moderate Sedation Time:  27 minutes The patient was continuously monitored during the procedure by the interventional radiology nurse under my direct supervision. FLUOROSCOPY TIME:  One minutes, 12 seconds (15 mGy) COMPLICATIONS: None immediate. CONTRAST:  None. PROCEDURE: Informed consent was obtained from the patient following explanation of the procedure, risks, benefits and alternatives. The patient understands, agrees and consents for the procedure. All questions were addressed. A time out was performed. Maximal barrier sterile technique utilized including caps, mask, sterile gowns, sterile  gloves, large sterile drape, hand hygiene, and 2% chlorhexidine scrub. Under sterile conditions and local anesthesia, left internal jugular micropuncture venous access was performed. Access was performed with ultrasound. Images were obtained for documentation of the left internal jugular vein. A guide wire was inserted followed by a transitional dilator. This allowed insertion of a guide wire and catheter into the IVC. Measurements were obtained from the SVC / RA junction back to the left IJ venotomy site. In the left infraclavicular chest, a subcutaneous pocket was created over the second anterior rib. This was done under sterile conditions and local anesthesia. 1% lidocaine with epinephrine was utilized for this. A 2.5 cm incision was made in the skin. Blunt dissection was performed to create a subcutaneous pocket over the right pectoralis major muscle. The pocket was flushed with saline vigorously. There was adequate hemostasis. The port catheter was assembled and checked for leakage. The port catheter was secured in the pocket with two retention sutures. The tubing was tunneled subcutaneously to the left venotomy site and inserted into the SVC/RA junction through a valved peel-away sheath. Position was confirmed with fluoroscopy. Images were obtained for documentation. The patient tolerated the procedure well. No immediate complications. Incisions were closed in a two layer fashion with 4 - 0 Vicryl suture. Dermabond was applied to the skin. The port catheter was accessed, blood was aspirated followed by saline and heparin flushes. Needle was removed. A dry sterile dressing was applied. IMPRESSION: Ultrasound and fluoroscopically guided left internal jugular single lumen power port catheter insertion. Tip in the SVC/RA junction. Catheter ready for use. Electronically Signed   By: Jerilynn Mages.  Shick M.D.   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  (a) CT chest abdomen and pelvis and bone scan 10/15/2019 showed no evidence of metastatic disease  (1) genetics testing 10/13/2019 through the Common Hereditary Cancers Panel offered by [facility] found no deleterious mutations in APC, ATM, AXIN2, BARD1, BMPR1A, BRCA1, BRCA2, BRIP1, CDH1, CDKN2A (p14ARF), CDKN2A (p16INK4a), CKD4, CHEK2, CTNNA1, DICER1, EPCAM (Deletion/duplication testing only), GREM1 (promoter region deletion/duplication testing only), KIT, MEN1, MLH1, MSH2, MSH3, MSH6, MUTYH, NBN, NF1, NHTL1, PALB2, PDGFRA, PMS2, POLD1, POLE, PTEN, RAD50, RAD51C, RAD51D, RNF43, SDHB, SDHC, SDHD, SMAD4, SMARCA4. STK11, TP53, TSC1, TSC2, and VHL.  The following genes were evaluated for sequence changes only: SDHA and HOXB13 c.251G>A variant only.  (2) neoadjuvant chemotherapy to consist of trastuzumab, pertuzumab, docetaxel and carboplatin every 21 days x 6 starting 10/19/2019  (3) trastuzumab and pertuzumab to be continued to total 1 year  (a) echo 10/15/2019 shows an ejection fraction in the 65-70% range.  (4) definitive surgery to follow  (5) adjuvant radiation.  PLAN:  Emiya 's staging studies do not show evidence of metastatic disease.  Accordingly we are treating for cure.  She will start her chemotherapy 10/19/2019.  I gave her a routing sheet today on how to take her medications and the [place] interpreter translated for her and wrote it down.  She is also coming for chemotherapy teaching 10/18/2019 to reinforce this.  Michale is going to try to obtain some kind of disability and I have written a letter for her stating that her intensive treatment will last at least 6 months including surgery and that she will not be able to work  during that time.  Hopefully she will be able to obtain some assistance.  I am going to see her again in about a week after her first cycle to make sure everything is under control and going well and to troubleshoot any problems.  I have urged her to call us if there is any problem that develops before then  Total encounter time 40 minutes.Sarajane Jews C. Halia Franey, MD 10/15/2019 1:57 PM Medical Oncology and Hematology [facility] [place], [place] [address] Tel. [phone]    Fax. [phone]   This document serves as a record of services personally performed by Lurline Del, MD. It was created on his behalf by Wilburn Mylar, a trained medical scribe. The creation of this record is based on the scribe's personal observations and the provider's statements to them.   I, Lurline Del MD, have reviewed the above documentation for accuracy and completeness, and I agree with the above.   *Total Encounter Time as defined by the Centers for Medicare and Medicaid Services includes, in addition to the face-to-face time of a patient visit (documented in the note above) non-face-to-face time: obtaining and reviewing outside history, ordering and reviewing medications, tests or procedures, care coordination (communications with other health care professionals or caregivers) and documentation in the medical record.

## 2019-10-14 NOTE — Telephone Encounter (Signed)
Revealed negative genetic testing.   We discussed that we do not know why she has breast cancer. It could be due to a different gene that we are not testing, or something our current technology cannot pick up.  It will be important for her to keep in contact with genetics to learn if additional testing may be needed in the future.

## 2019-10-15 ENCOUNTER — Ambulatory Visit (HOSPITAL_COMMUNITY)
Admission: RE | Admit: 2019-10-15 | Discharge: 2019-10-15 | Disposition: A | Discharge status: Home / Self Care | Payer: Medicaid Other | Source: Ambulatory Visit | Attending: Oncology | Admitting: Oncology

## 2019-10-15 ENCOUNTER — Encounter (HOSPITAL_COMMUNITY)
Admission: RE | Admit: 2019-10-15 | Discharge: 2019-10-15 | Disposition: A | Discharge status: Home / Self Care | Payer: Medicaid Other | Source: Ambulatory Visit | Attending: Oncology | Admitting: Oncology

## 2019-10-15 ENCOUNTER — Encounter: Payer: Self-pay | Admitting: Oncology

## 2019-10-15 ENCOUNTER — Ambulatory Visit (HOSPITAL_BASED_OUTPATIENT_CLINIC_OR_DEPARTMENT_OTHER)
Admission: RE | Admit: 2019-10-15 | Discharge: 2019-10-15 | Disposition: A | Discharge status: Home / Self Care | Payer: Medicaid Other | Source: Ambulatory Visit | Attending: Oncology | Admitting: Oncology

## 2019-10-15 ENCOUNTER — Other Ambulatory Visit: Payer: Self-pay

## 2019-10-15 ENCOUNTER — Inpatient Hospital Stay: Payer: Medicaid Other | Attending: Oncology | Admitting: Oncology

## 2019-10-15 VITALS — BP 141/96 | HR 83 | Temp 98.2°F | Resp 18 | Ht 64.0 in | Wt 204.5 lb

## 2019-10-15 DIAGNOSIS — C50411 Malignant neoplasm of upper-outer quadrant of right female breast: Secondary | ICD-10-CM

## 2019-10-15 DIAGNOSIS — C7981 Secondary malignant neoplasm of breast: Secondary | ICD-10-CM | POA: Diagnosis not present

## 2019-10-15 DIAGNOSIS — Z170 Estrogen receptor positive status [ER+]: Secondary | ICD-10-CM | POA: Insufficient documentation

## 2019-10-15 DIAGNOSIS — C801 Malignant (primary) neoplasm, unspecified: Secondary | ICD-10-CM | POA: Diagnosis not present

## 2019-10-15 DIAGNOSIS — Z1379 Encounter for other screening for genetic and chromosomal anomalies: Secondary | ICD-10-CM

## 2019-10-15 DIAGNOSIS — C50911 Malignant neoplasm of unspecified site of right female breast: Secondary | ICD-10-CM | POA: Diagnosis not present

## 2019-10-15 MED ORDER — IOHEXOL 300 MG/ML  SOLN
100.0000 mL | Freq: Once | INTRAMUSCULAR | Status: AC | PRN
  Administered 2019-10-15: 100 mL via INTRAVENOUS

## 2019-10-15 MED ORDER — SODIUM CHLORIDE (PF) 0.9 % IJ SOLN
INTRAMUSCULAR | Status: AC
  Filled 2019-10-15: qty 50

## 2019-10-15 MED ORDER — FLUDEOXYGLUCOSE F - 18 (FDG) INJECTION: 19.0000 | Freq: Once | INTRAVENOUS | Status: DC

## 2019-10-15 MED ORDER — TECHNETIUM TC 99M MEDRONATE IV KIT
19.0000 | PACK | Freq: Once | INTRAVENOUS | Status: AC
  Administered 2019-10-15: 19 via INTRAVENOUS

## 2019-10-15 NOTE — Progress Notes (Signed)
  Echocardiogram 2D Echocardiogram has been performed.  Brynnly Bonet A Wanona Stare 10/15/2019, 10:36 AM

## 2019-10-18 ENCOUNTER — Other Ambulatory Visit: Payer: Self-pay

## 2019-10-18 ENCOUNTER — Encounter: Payer: Self-pay | Admitting: *Deleted

## 2019-10-18 ENCOUNTER — Inpatient Hospital Stay: Payer: Medicaid Other

## 2019-10-18 ENCOUNTER — Encounter: Payer: Self-pay | Admitting: Oncology

## 2019-10-18 ENCOUNTER — Ambulatory Visit (HOSPITAL_COMMUNITY): Admission: RE | Admit: 2019-10-18 | Payer: Medicaid Other | Source: Ambulatory Visit

## 2019-10-18 NOTE — Progress Notes (Signed)
Met with patient at registration to introduce myself as Financial Resource Specialist and to offer available resources.  Discussed one-time $1000 [employer] grant and qualifications to assist with personal expenses while going through treatment.  Gave her my card if interested in applying and for any additional financial questions or concerns.  

## 2019-10-19 ENCOUNTER — Ambulatory Visit (HOSPITAL_COMMUNITY): Payer: Medicaid Other

## 2019-10-19 ENCOUNTER — Other Ambulatory Visit: Payer: Self-pay

## 2019-10-19 ENCOUNTER — Encounter: Payer: Self-pay | Admitting: *Deleted

## 2019-10-19 ENCOUNTER — Inpatient Hospital Stay: Payer: Medicaid Other

## 2019-10-19 VITALS — BP 158/98 | HR 62 | Temp 97.7°F | Resp 18 | Ht 64.0 in | Wt 204.8 lb

## 2019-10-19 DIAGNOSIS — Z5112 Encounter for antineoplastic immunotherapy: Secondary | ICD-10-CM | POA: Insufficient documentation

## 2019-10-19 DIAGNOSIS — C50411 Malignant neoplasm of upper-outer quadrant of right female breast: Secondary | ICD-10-CM | POA: Insufficient documentation

## 2019-10-19 DIAGNOSIS — I10 Essential (primary) hypertension: Secondary | ICD-10-CM | POA: Insufficient documentation

## 2019-10-19 DIAGNOSIS — Z171 Estrogen receptor negative status [ER-]: Secondary | ICD-10-CM | POA: Insufficient documentation

## 2019-10-19 DIAGNOSIS — Z5111 Encounter for antineoplastic chemotherapy: Secondary | ICD-10-CM | POA: Insufficient documentation

## 2019-10-19 DIAGNOSIS — Z170 Estrogen receptor positive status [ER+]: Secondary | ICD-10-CM

## 2019-10-19 DIAGNOSIS — C773 Secondary and unspecified malignant neoplasm of axilla and upper limb lymph nodes: Secondary | ICD-10-CM | POA: Insufficient documentation

## 2019-10-19 DIAGNOSIS — Z79899 Other long term (current) drug therapy: Secondary | ICD-10-CM | POA: Insufficient documentation

## 2019-10-19 DIAGNOSIS — Z95828 Presence of other vascular implants and grafts: Secondary | ICD-10-CM

## 2019-10-19 LAB — COMPREHENSIVE METABOLIC PANEL
ALT: 35 U/L (ref 0–44)
AST: 24 U/L (ref 15–41)
Albumin: 4 g/dL (ref 3.5–5.0)
Alkaline Phosphatase: 94 U/L (ref 38–126)
Anion gap: 12 (ref 5–15)
BUN: 16 mg/dL (ref 6–20)
CO2: 24 mmol/L (ref 22–32)
Calcium: 9.3 mg/dL (ref 8.9–10.3)
Chloride: 102 mmol/L (ref 98–111)
Creatinine, Ser: 0.8 mg/dL (ref 0.44–1.00)
GFR calc Af Amer: >60 mL/min (ref >60)
GFR calc non Af Amer: >60 mL/min (ref >60)
Glucose, Bld: 113 mg/dL — ABNORMAL HIGH (ref 70–99)
Potassium: 3.8 mmol/L (ref 3.5–5.1)
Sodium: 138 mmol/L (ref 135–145)
Total Bilirubin: 0.8 mg/dL (ref 0.3–1.2)
Total Protein: 7.7 g/dL (ref 6.5–8.1)

## 2019-10-19 LAB — CBC WITH DIFFERENTIAL/PLATELET
Abs Immature Granulocytes: 0.05 10*3/uL (ref 0.00–0.07)
Basophils Absolute: 0 10*3/uL (ref 0.0–0.1)
Basophils Relative: 0 %
Eosinophils Absolute: 0 10*3/uL (ref 0.0–0.5)
Eosinophils Relative: 0 %
HCT: 37.9 % (ref 36.0–46.0)
Hemoglobin: 12.4 g/dL (ref 12.0–15.0)
Immature Granulocytes: 1 %
Lymphocytes Relative: 14 %
Lymphs Abs: 1.5 10*3/uL (ref 0.7–4.0)
MCH: 28.6 pg (ref 26.0–34.0)
MCHC: 32.7 g/dL (ref 30.0–36.0)
MCV: 87.3 fL (ref 80.0–100.0)
Monocytes Absolute: 0.4 10*3/uL (ref 0.1–1.0)
Monocytes Relative: 4 %
Neutro Abs: 8.5 10*3/uL — ABNORMAL HIGH (ref 1.7–7.7)
Neutrophils Relative %: 81 %
Platelets: 374 10*3/uL (ref 150–400)
RBC: 4.34 MIL/uL (ref 3.87–5.11)
RDW: 12.3 % (ref 11.5–15.5)
WBC: 10.5 10*3/uL (ref 4.0–10.5)
nRBC: 0 % (ref 0.0–0.2)

## 2019-10-19 LAB — PREGNANCY, URINE: Preg Test, Ur: NEGATIVE

## 2019-10-19 MED ORDER — PALONOSETRON HCL INJECTION 0.25 MG/5ML
0.2500 mg | Freq: Once | INTRAVENOUS | Status: AC
  Administered 2019-10-19: 0.25 mg via INTRAVENOUS

## 2019-10-19 MED ORDER — SODIUM CHLORIDE 0.9 % IV SOLN
75.0000 mg/m2 | Freq: Once | INTRAVENOUS | Status: AC
  Administered 2019-10-19: 150 mg via INTRAVENOUS
  Filled 2019-10-19: qty 15

## 2019-10-19 MED ORDER — SODIUM CHLORIDE 0.9% FLUSH
10.0000 mL | INTRAVENOUS | Status: DC | PRN
  Administered 2019-10-19: 10 mL via INTRAVENOUS
  Filled 2019-10-19: qty 10

## 2019-10-19 MED ORDER — ACETAMINOPHEN 325 MG PO TABS
650.0000 mg | ORAL_TABLET | Freq: Once | ORAL | Status: AC
  Administered 2019-10-19: 650 mg via ORAL

## 2019-10-19 MED ORDER — PALONOSETRON HCL INJECTION 0.25 MG/5ML
INTRAVENOUS | Status: AC
  Filled 2019-10-19: qty 5

## 2019-10-19 MED ORDER — SODIUM CHLORIDE 0.9 % IV SOLN
420.0000 mg | Freq: Once | INTRAVENOUS | Status: AC
  Administered 2019-10-19: 420 mg via INTRAVENOUS
  Filled 2019-10-19: qty 14

## 2019-10-19 MED ORDER — DIPHENHYDRAMINE HCL 25 MG PO CAPS
25.0000 mg | ORAL_CAPSULE | Freq: Once | ORAL | Status: AC
  Administered 2019-10-19: 25 mg via ORAL

## 2019-10-19 MED ORDER — SODIUM CHLORIDE 0.9 % IV SOLN
10.0000 mg | Freq: Once | INTRAVENOUS | Status: AC
  Administered 2019-10-19: 10 mg via INTRAVENOUS
  Filled 2019-10-19: qty 10

## 2019-10-19 MED ORDER — SODIUM CHLORIDE 0.9 % IV SOLN
Freq: Once | INTRAVENOUS | Status: AC
  Filled 2019-10-19: qty 250

## 2019-10-19 MED ORDER — SODIUM CHLORIDE 0.9% FLUSH
10.0000 mL | INTRAVENOUS | Status: DC | PRN
  Administered 2019-10-19: 10 mL
  Filled 2019-10-19: qty 10

## 2019-10-19 MED ORDER — HEPARIN SOD (PORK) LOCK FLUSH 100 UNIT/ML IV SOLN
500.0000 [IU] | Freq: Once | INTRAVENOUS | Status: AC | PRN
  Administered 2019-10-19: 500 [IU]
  Filled 2019-10-19: qty 5

## 2019-10-19 MED ORDER — SODIUM CHLORIDE 0.9 % IV SOLN
750.0000 mg | Freq: Once | INTRAVENOUS | Status: AC
  Administered 2019-10-19: 750 mg via INTRAVENOUS
  Filled 2019-10-19: qty 75

## 2019-10-19 MED ORDER — TRASTUZUMAB-DKST CHEMO 150 MG IV SOLR
750.0000 mg | Freq: Once | INTRAVENOUS | Status: AC
  Administered 2019-10-19: 750 mg via INTRAVENOUS
  Filled 2019-10-19: qty 35.72

## 2019-10-19 MED ORDER — ACETAMINOPHEN 325 MG PO TABS
ORAL_TABLET | ORAL | Status: AC
  Filled 2019-10-19: qty 2

## 2019-10-19 MED ORDER — DIPHENHYDRAMINE HCL 25 MG PO CAPS
ORAL_CAPSULE | ORAL | Status: AC
  Filled 2019-10-19: qty 1

## 2019-10-19 MED ORDER — SODIUM CHLORIDE 0.9 % IV SOLN
150.0000 mg | Freq: Once | INTRAVENOUS | Status: AC
  Administered 2019-10-19: 150 mg via INTRAVENOUS
  Filled 2019-10-19: qty 150

## 2019-10-19 NOTE — Patient Instructions (Signed)
[name] Discharge Instructions for Patients Receiving Chemotherapy  Today you received the following chemotherapy agents Trastuzumab; Pertuzumab; Carboplatin; Docetaxel  To help prevent nausea and vomiting after your treatment, we encourage you to take your nausea medication as directed   If you develop nausea and vomiting that is not controlled by your nausea medication, call the clinic.   BELOW ARE SYMPTOMS THAT SHOULD BE REPORTED IMMEDIATELY:  *FEVER GREATER THAN 100.5 F  *CHILLS WITH OR WITHOUT FEVER  NAUSEA AND VOMITING THAT IS NOT CONTROLLED WITH YOUR NAUSEA MEDICATION  *UNUSUAL SHORTNESS OF BREATH  *UNUSUAL BRUISING OR BLEEDING  TENDERNESS IN MOUTH AND THROAT WITH OR WITHOUT PRESENCE OF ULCERS  *URINARY PROBLEMS  *BOWEL PROBLEMS  UNUSUAL RASH Items with * indicate a potential emergency and should be followed up as soon as possible.  Feel free to call the clinic should you have any questions or concerns. The clinic phone number is [phone].  Please show the CHEMO ALERT CARD at check-in to the Emergency Department and triage nurse.  Trastuzumab; Hyaluronidase injection Qu es este medicamento? TRASTUZUMAB; HIALURONIDASA se usa para tratar el cncer de mama y el cncer de estmago. El trastuzumab es un anticuerpo monoclonal. La hialuronidasa se usa para mejorar los efectos del trastuzumab. Este medicamento puede ser utilizado para otros usos; si tiene alguna pregunta consulte con su proveedor de atencin mdica o con su farmacutico. MARCAS COMUNES: HERCEPTIN HYLECTA Qu le debo informar a mi profesional de la salud antes de tomar este medicamento? Necesitan saber si usted presenta alguno de los siguientes problemas o situaciones: enfermedad cardiaca insuficiencia cardiaca enfermedad pulmonar o respiratoria, como asma una reaccin alrgica o inusual al trastuzumab, a otros medicamentos, alimentos, colorantes o conservantes si est embarazada o  buscando quedar embarazada si est amamantando a un beb Cmo debo utilizar este medicamento? Este medicamento se administra mediante una inyeccin por va subcutnea. Lo administra un profesional de la salud en un hospital o en un entorno clnico. Hable con su pediatra para informarse acerca del uso de este medicamento en nios. Este medicamento no est aprobado para uso en nios. Sobredosis: Pngase en contacto inmediatamente con un centro toxicolgico o una sala de urgencia si usted cree que haya tomado demasiado medicamento. ATENCIN: Este medicamento es solo para usted. No comparta este medicamento con nadie. Qu sucede si me olvido de una dosis? Es importante no olvidar ninguna dosis. Informe a su mdico o a su profesional de la salud si no puede asistir a una cita. Qu puede interactuar con este medicamento? Este medicamento podra interactuar con los siguientes frmacos: ciertos tipos de quimioterapia, tales como daunorubicina, doxorrubicina, epirubicina, e idarubicina Puede ser que esta lista no menciona todas las posibles interacciones. Informe a su profesional de la salud de todos los productos a base de hierbas, medicamentos de venta libre o suplementos nutritivos que est tomando. Si usted fuma, consume bebidas alcohlicas o si utiliza drogas ilegales, indqueselo tambin a su profesional de la salud. Algunas sustancias pueden interactuar con su medicamento. A qu debo estar atento al usar este medicamento? Visite a su mdico para que revise su evolucin. Si presenta algn efecto secundario, infrmelo. Contine con el tratamiento aun si se siente enfermo, a menos que su mdico le indique que lo suspenda. Consulte a su mdico o a su profesional de la salud si tiene fiebre, escalofros o dolor de garganta, o cualquier otro sntoma de resfro o gripe. No se trate usted mismo. Trate de no acercarse a personas que estn   enfermas. Es posible que tenga fiebre, escalofros y temblores durante  su primera infusin. Estos efectos generalmente son leves y pueden tratarse con otros medicamentos. Informe cualquier efecto secundario durante la infusin a su profesional de la salud. La fiebre y los escalofros por lo general no suceden con las infusiones posteriores. No debe quedar embarazada mientras est usando este medicamento o por 7 meses despus de dejar de usarlo. Las mujeres deben informar a su mdico si estn buscando quedar embarazadas o si creen que podran estar embarazadas. Las mujeres con la posibilidad de tener nios deben tener una prueba de embarazo negativa antes de empezar a tomar este medicamento. Existe la posibilidad de efectos secundarios graves en un beb sin nacer. Para obtener ms informacin, hable con su profesional de la salud o su farmacutico. No debe amamantar a un beb mientras est tomando este medicamento o por 7 meses despus de dejar de usarlo. Qu efectos secundarios puedo tener al utilizar este medicamento? Efectos secundarios que debe informar a su mdico o a su profesional de la salud tan pronto como sea posible: reacciones alrgicas, como erupcin cutnea, comezn/picazn o urticaria, e hinchazn de la cara, los labios o la lengua problemas respiratorios dolor en el pecho o palpitaciones tos fiebre sensacin general de estar enfermo o sntomas gripales signos de empeoramiento de la insuficiencia cardiaca, tales como problemas respiratorios; hinchazn en las piernas y los pies Efectos secundarios que generalmente no requieren atencin mdica (debe informarlos a su mdico o a su profesional de la salud si persisten o si son molestos): dolor de huesos cambios en el sentido del gusto diarrea dolor en las articulaciones nuseas, vmito cansancio o debilidad inusual prdida de peso Puede ser que esta lista no menciona todos los posibles efectos secundarios. Comunquese a su mdico por asesoramiento mdico sobre los efectos secundarios. Usted puede informar los efectos  secundarios a la FDA por telfono al 1-800-FDA-1088. Dnde debo guardar mi medicina? Este medicamento se administra en hospitales o clnicas, y no necesitar guardarlo en su domicilio. ATENCIN: Este folleto es un resumen. Puede ser que no cubra toda la posible informacin. Si usted tiene preguntas acerca de esta medicina, consulte con su mdico, su farmacutico o su profesional de la salud.  2020 Elsevier/Gold Standard (2018-04-06 00:00:00)  Pertuzumab injection Qu es este medicamento? El PERTUZUMAB es un anticuerpo monoclonal. Se utiliza para tratar el cncer de mama. Este medicamento puede ser utilizado para otros usos; si tiene alguna pregunta consulte con su proveedor de atencin mdica o con su farmacutico. MARCAS COMUNES: PERJETA Qu le debo informar a mi profesional de la salud antes de tomar este medicamento? Necesita saber si usted presenta alguno de los siguientes problemas o situaciones: enfermedad cardiaca insuficiencia cardiaca alta presin sangunea antecedentes de pulso cardiaca irregular radioterapia reciente o continuada una reaccin alrgica o inusual al pertuzumab, a otros medicamentos, alimentos, colorantes o conservantes si est embarazada o buscando quedar embarazada si est amamantando a un beb Cmo debo utilizar este medicamento? Este medicamento se administra mediante infusin por va intravenosa. Lo administra un profesional de la salud en un hospital o en un entorno clnico. Hable con su pediatra para informarse acerca del uso de este medicamento en nios. Puede requerir atencin especial. Sobredosis: Pngase en contacto inmediatamente con un centro toxicolgico o una sala de urgencia si usted cree que haya tomado demasiado medicamento. ATENCIN: Este medicamento es solo para usted. No comparta este medicamento con nadie. Qu sucede si me olvido de una dosis? Es importante no olvidar ninguna dosis.   Informe a su mdico o a su profesional de la salud si no puede  asistir a una cita. Qu puede interactuar con este medicamento? No se esperan interacciones. Puede ser que esta lista no menciona todas las posibles interacciones. Informe a su profesional de la salud de todos los productos a base de hierbas, medicamentos de venta libre o suplementos nutritivos que est tomando. Si usted fuma, consume bebidas alcohlicas o si utiliza drogas ilegales, indqueselo tambin a su profesional de la salud. Algunas sustancias pueden interactuar con su medicamento. A qu debo estar atento al usar este medicamento? Se supervisar su estado de salud atentamente mientras reciba este medicamento. Si presenta algn efecto secundario, infrmelo. Sin embargo, contine con el tratamiento aun si se siente enfermo, a menos que su mdico le indique que lo suspenda. No debe quedar embarazada mientras est tomando este medicamento o por 7 meses despus de dejar de usarlo. Las mujeres deben informar a su mdico si estn buscando quedar embarazadas o si creen que estn embarazadas. Las mujeres con la posibilidad de tener nios deben tener una prueba de embarazo negativa antes de empezar a tomar este medicamento. Existe la posibilidad de que ocurran efectos secundarios graves a un beb sin nacer. Para ms informacin hable con su profesional de la salud o su farmacutico. No debe amamantar a un beb mientras est tomando este medicamento o por 7 meses despus de dejar de usarlo. Las mujeres deben usar un mtodo anticonceptivo eficaz con este medicamento. Consulte a su mdico o a su profesional de la salud si tiene fiebre, escalofros, dolor de garganta, o cualquier otro sntoma de resfro o gripe. No se trate usted mismo. Trate de no acercarse a personas que estn enfermas. Es posible que tenga fiebre, escalofros y dolor de cabeza durante la infusin. Informe cualquier efecto secundario durante la infusin a su profesional de la salud. Qu efectos secundarios puedo tener al utilizar este  medicamento? Efectos secundarios que debe informar a su mdico o a su profesional de la salud tan pronto como sea posible: problemas respiratorios dolor en el pecho o palpitaciones mareos sensacin de desmayos o aturdimiento fiebre o escalofros erupcin cutnea, picazn o urticaria dolor de garganta hinchazn de la cara, labios o lengua hinchazn de piernas o tobillos cansancio o debilidad inusual Efectos secundarios que generalmente no requieren atencin mdica (infrmelos a su mdico o a su profesional de la salud si persisten o si son molestos): diarrea cada del cabello nuseas, vmito cansancio Puede ser que esta lista no menciona todos los posibles efectos secundarios. Comunquese a su mdico por asesoramiento mdico sobre los efectos secundarios. Usted puede informar los efectos secundarios a la FDA por telfono al 1-800-FDA-1088. Dnde debo guardar mi medicina? Este medicamento se administra en hospitales o clnicas y no necesitar guardarlo en su domicilio. ATENCIN: Este folleto es un resumen. Puede ser que no cubra toda la posible informacin. Si usted tiene preguntas acerca de esta medicina, consulte con su mdico, su farmacutico o su profesional de la salud.  2020 Elsevier/Gold Standard (2016-06-27 00:00:00)  Carboplatin injection Qu es este medicamento? El CARBOPLATINO es un agente quimioteraputico. Este medicamento acta sobre las clulas que se dividen rpidamente, como las clulas cancergenas, y finalmente provoca la muerte de estas clulas. Se utiliza en el tratamiento del cncer de ovario y muchos otros tipos de cncer. Este medicamento puede ser utilizado para otros usos; si tiene alguna pregunta consulte con su proveedor de atencin mdica o con su farmacutico. MARCAS COMUNES: Paraplatin Qu le debo informar   a mi profesional de la salud antes de tomar este medicamento? Necesita saber si usted presenta alguno de los siguientes problemas o situaciones:  trastornos  sanguneos  problemas auditivos  enfermedad renal  radioterapia reciente o continuada  una reaccin alrgica o inusual al carboplatino, al cisplatino, a otros agentes quimioteraputicos, a otros medicamentos, alimentos, colorantes o conservantes  si est embarazada o buscando quedar embarazada  si est amamantando a un beb Cmo debo utilizar este medicamento? Este medicamento se administra normalmente mediante infusin por va intravenosa. Lo administra un profesional de la salud calificado en un hospital o en un entorno clnico. Hable con su pediatra para informarse acerca del uso de este medicamento en nios. Puede requerir atencin especial. Sobredosis: Pngase en contacto inmediatamente con un centro toxicolgico o una sala de urgencia si usted cree que haya tomado demasiado medicamento. ATENCIN: Este medicamento es solo para usted. No comparta este medicamento con nadie. Qu sucede si me olvido de una dosis? Es importante no olvidar ninguna dosis. Informe a su mdico o a su profesional de la salud si no puede asistir a una cita. Qu puede interactuar con este medicamento?  medicamentos para convulsiones  medicamentos para incrementar los conteos sanguneos, tales como filgrastim, pegfilgrastim, sargramostim  ciertos antibiticos, tales como amicacina, gentamicina, neomicina, estreptomicina, tobramicina  vacunas Consulte a su mdico o a su profesional de la salud antes de tomar cualquiera de los siguientes medicamentos:  acetaminofeno  aspirina  ibuprofeno  quetoprofeno  naproxeno Puede ser que esta lista no menciona todas las posibles interacciones. Informe a su profesional de la salud de todos los productos a base de hierbas, medicamentos de venta libre o suplementos nutritivos que est tomando. Si usted fuma, consume bebidas alcohlicas o si utiliza drogas ilegales, indqueselo tambin a su profesional de la salud. Algunas sustancias pueden interactuar con su  medicamento. A qu debo estar atento al usar este medicamento? Se supervisar su estado de salud atentamente mientras reciba este medicamento. Tendr que hacerse anlisis de sangre peridicos mientras est tomando este medicamento. Este medicamento puede hacerle sentir un malestar general. Esto es normal ya que la quimioterapia afecta tanto a las clulas sanas como a las clulas cancerosas. Si presenta alguno de los efectos secundarios, infrmelos. Sin embargo, contine con el tratamiento aun si se siente enfermo, a menos que su mdico le indique que lo suspenda. En algunos casos, podr recibir medicamentos adicionales para ayudarle con los efectos secundarios. Siga las instrucciones para usarlos. Consulte a su mdico o a su profesional de la salud por asesoramiento si tiene fiebre, escalofros, dolor de garganta o cualquier otro sntoma de resfro o gripe. No se trate usted mismo. Este medicamento puede reducir la capacidad del cuerpo para combatir infecciones. Trate de no acercarse a personas que estn enfermas. Este medicamento puede aumentar el riesgo de magulladuras o sangrado. Consulte a su mdico o a su profesional de la salud si observa sangrados inusuales. Proceda con cuidado al cepillar sus dientes, usar hilo dental o utilizar palillos para los dientes, ya que puede contraer una infeccin o sangrar con mayor facilidad. Si se somete a algn tratamiento dental, informe a su dentista que est usando este medicamento. Evite tomar productos que contienen aspirina, acetaminofeno, ibuprofeno, naproxeno o quetoprofeno a menos que as lo indique su mdico. Estos productos pueden disimular la fiebre. No se debe quedar embarazada mientras recibe este medicamento. Las mujeres deben informar a su mdico si estn buscando quedar embarazadas o si creen que estn embarazadas. Existe la posibilidad de efectos secundarios   graves a un beb sin nacer. Para ms informacin hable con su profesional de la salud o su  farmacutico. No debe Economist a un beb mientras est usando este medicamento. Qu efectos secundarios puedo tener al [employer] este medicamento? Efectos secundarios que debe informar a su mdico o a Barrister's clerk de la salud tan pronto como sea posible:  Chief of Staff como erupcin cutnea, picazn o urticarias, hinchazn de la cara, labios o lengua  signos de infeccin - fiebre o escalofros, tos, dolor de garganta, dolor o dificultad para orinar  signos de reduccin de plaquetas o sangrado - magulladuras, puntos rojos en la piel, heces de color oscuro o con aspecto alquitranado, sangrando por la nariz  signos de reduccin de glbulos rojos - cansancio o debilidad inusual, desmayos, sensacin de [employer]  problemas respiratorios  cambios de audicin  cambios en la visin  dolor en el pecho  alta presin sangunea  conteos sanguneos bajos - Este medicamento puede reducir la cantidad de glbulos blancos, glbulos rojos y plaquetas. Su riesgo de infeccin y [place].  nuseas, vmito  dolor, enrojecimiento, hinchazn o irritacin en el lugar de la inyeccin  dolor, hormigueo, entumecimiento de manos o pies  problemas de coordinacin, del habla, al caminar  dificultad para orinar o cambios en el volumen de orina Efectos secundarios que, por lo general, no requieren atencin mdica (debe informarlos a su mdico o a su profesional de la salud si persisten o si son molestos):  cada del cabello  prdida del apetito  sabor metlico o cambios en el sentido del gusto Puede ser que esta lista no menciona todos los posibles efectos secundarios. Comunquese a su mdico por asesoramiento mdico [employer] Usted puede informar los efectos secundarios a la FDA por telfono al 1-800-FDA-1088. Dnde debo guardar mi medicina? Este medicamento se administra en hospitales o clnicas y no necesitar guardarlo en su domicilio. ATENCIN: Este folleto  es un resumen. Puede ser que no cubra toda la posible informacin. Si usted tiene preguntas acerca de esta medicina, consulte con su mdico, su farmacutico o su profesional de Technical sales engineer.  2020 Elsevier/Gold Standard (2014-07-19 00:00:00)  Docetaxel injection Qu es este medicamento? El DOCETAXEL es un agente quimioteraputico. Este medicamento acta sobre las clulas que se dividen rpidamente, como las clulas cancergenas, y finalmente provoca la muerte de estas clulas. Se utiliza en el tratamiento de muchos tipos de cncer como el cncer de mama, adenocarcinoma gstrico, cabeza y cuello, pulmn y prstata. Este medicamento puede ser utilizado para otros usos; si tiene alguna pregunta consulte con su proveedor de atencin mdica o con su farmacutico. MARCAS COMUNES: Docefrez, Taxotere Qu le debo informar a mi profesional de la salud antes de tomar este medicamento? Necesita saber si usted presenta alguno de los siguientes problemas o situaciones:  infeccin (especialmente infecciones virales, como varicela o herpes)  enfermedad heptica  conteos sanguneos bajos, como baja cantidad de glbulos blancos, glbulos rojos y plaquetas  una reaccin alrgica o inusual al docetaxel, polisorbato 80, a otros agentes quimioteraputicos, a otros medicamentos, alimentos, colorantes o conservantes  si est embarazada o buscando quedar embarazada  si est amamantando a un beb Cmo debo utilizar este medicamento? Este medicamento se administra como infusin en una vena. Un profesional de la salud especialmente capacitado lo administra en un hospital o clnica. Hable con su pediatra para informarse acerca del uso de este medicamento en nios. Puede requerir atencin especial. Sobredosis: Pngase en contacto inmediatamente con un centro toxicolgico o  una sala de urgencia si usted cree que haya tomado demasiado medicamento. ATENCIN: [employer] es solo para usted. No comparta este medicamento  con nadie. Qu sucede si me olvido de una dosis? Es importante no olvidar ninguna dosis. Informe a su mdico o a su profesional de la salud si no puede asistir a Photographer. Qu puede interactuar con este medicamento? aprepitant ciertos antibiticos, tales como eritromicina o claritromicina ciertos medicamentos antivirales para VIH o hepatitis ciertos medicamentos para infecciones micticas, tales como fluconazol, itraconazol, ketoconazol, posaconazol o voriconazol cimetidina ciprofloxacino conivaptn ciclosporina dronedarona fluvoxamina jugo de toronja (pomelo) imatinib verapamilo Puede ser que esta lista no menciona todas las posibles interacciones. Informe a su profesional de [employer]	[place] de [employer] productos a base de hierbas, medicamentos de [place] o suplementos nutritivos que est tomando. Si usted fuma, consume bebidas alcohlicas o si utiliza drogas ilegales, indqueselo tambin a su profesional de [employer]	[place]. Algunas sustancias pueden interactuar con su medicamento. A qu debo estar atento al usar [employer]? Se supervisar su estado de salud atentamente mientras reciba este medicamento. Tendr que hacerse anlisis de sangre importantes mientras est usando este medicamento. Llame a su mdico o a su profesional de la salud si tiene fiebre, escalofros o dolor de garganta, o cualquier otro sntoma de resfro o gripe. No se trate usted mismo. Este medicamento reduce la capacidad del cuerpo para combatir infecciones. Trate de no acercarse a personas que estn enfermas. Algunos productos pueden contener alcohol. Pregunte a su profesional de la salud si este medicamento contiene alcohol. Asegrese de informar a todos los profesionales de la salud que usted est usando [place]. Ciertos medicamentos, como metronidazol y disulfirm, pueden causar una reaccin desagradable cuando se usan con alcohol. Esta reaccin incluye enrojecimiento, dolor de cabeza, nuseas, vmitos, sudoracin y  aumento de la sed. La reaccin puede durar de 30 minutos a varias horas. Puede experimentar somnolencia o mareos. No conduzca, no utilice maquinaria ni haga nada que Associate Professor en estado de alerta hasta que sepa cmo le afecta este medicamento. No se siente ni se ponga de pie con rapidez, especialmente si es un paciente de edad avanzada. Esto reduce el riesgo de mareos o [employer]. El alcohol puede interferir con el efecto de este medicamento. Hable con su profesional de la salud sobre su riesgo de Hotel manager. Usted puede tener mayor riesgo para ciertos tipos de cncer si [place] este medicamento. No debe quedar embarazada mientras est usando este medicamento o por 6 meses despus de dejar de usarlo. Las mujeres deben informar a su mdico si estn buscando quedar embarazadas o si creen que podran estar embarazadas. Existe la posibilidad deEfectos secundarios graves en un beb sin nacer. Para obtener ms informacin, hable con su profesional de la salud o su farmacutico. No debe Economist a un beb mientras est usando este medicamento o por 1 semana despus de dejar de usarlo. Los hombres que reciben este medicamento deben usar un condn durante las relaciones sexuales con mujeres que puedan quedar embarazadas. Si usted embaraza a [employer], el beb podra tener defectos de nacimiento. El beb podra morir antes de Associate Professor. Deber seguir usando condn durante 3 meses despus de suspender el medicamento. Informe a su proveedor de Geophysical data processor de inmediato si su pareja queda embarazada mientras usted est [employer]. Esto puede interferir con la capacidad de los hombres para Social worker a Musician. Usted debe hablar con su mdico o su profesional de la salud si est preocupado por su  fertilidad. Qu efectos secundarios puedo tener al utilizar este medicamento? Efectos secundarios que debe informar a su mdico o a su profesional de la salud tan pronto como sea posible: reacciones alrgicas,  como erupcin cutnea, comezn/picazn o urticaria, e hinchazn de la cara, los labios o la lengua visin borrosa problemas para respirar cambios en la visin conteos sanguneos bajos: este frmaco podra reducir la cantidad de glbulos blancos, glbulos rojos y plaquetas. Su riesgo de infeccin y sangrado podra ser mayor. nuseas y vmito dolor, enrojecimiento o irritacin en el lugar de la inyeccin dolor, hormigueo o entumecimiento de las manos o los pies enrojecimiento, formacin de ampollas, descamacin o distensin de la piel, incluso dentro de la boca signos de disminucin en la cantidad de plaquetas o sangrado: moretones, puntos rojos en la piel, heces de color negro y aspecto alquitranado, sangrado por la nariz signos de disminucin en la cantidad de glbulos rojos: cansancio o debilidad inusual, desmayos, aturdimiento signos de infeccin: fiebre o escalofros, tos, dolor de garganta, dolor o dificultad para orinar hinchazn de tobillos, pies, manos Efectos secundarios que generalmente no requieren atencin mdica (infrmelos a su mdico o a su profesional de la salud si persisten o si son molestos): estreimiento diarrea cambios en las uas de las manos o de los pies cada del cabello prdida del apetito llagas en la boca dolor muscular Puede ser que esta lista no menciona todos los posibles efectos secundarios. Comunquese a su mdico por asesoramiento mdico sobre los efectos secundarios. Usted puede informar los efectos secundarios a la FDA por telfono al 1-800-FDA-1088. Dnde debo guardar mi medicina? Este medicamento se administra en hospitales o clnicas y no necesitar guardarlo en su domicilio. ATENCIN: Este folleto es un resumen. Puede ser que no cubra toda la posible informacin. Si usted tiene preguntas acerca de esta medicina, consulte con su mdico, su farmacutico o su profesional de la salud.  2020 [employer] Standard (2019-03-31 00:00:00)    

## 2019-10-21 ENCOUNTER — Other Ambulatory Visit: Payer: Self-pay

## 2019-10-21 ENCOUNTER — Inpatient Hospital Stay: Payer: Medicaid Other

## 2019-10-21 VITALS — BP 137/71 | HR 88 | Temp 98.5°F | Resp 20

## 2019-10-21 DIAGNOSIS — Z79899 Other long term (current) drug therapy: Secondary | ICD-10-CM | POA: Diagnosis not present

## 2019-10-21 DIAGNOSIS — C773 Secondary and unspecified malignant neoplasm of axilla and upper limb lymph nodes: Secondary | ICD-10-CM | POA: Diagnosis not present

## 2019-10-21 DIAGNOSIS — I10 Essential (primary) hypertension: Secondary | ICD-10-CM | POA: Diagnosis not present

## 2019-10-21 DIAGNOSIS — C50411 Malignant neoplasm of upper-outer quadrant of right female breast: Secondary | ICD-10-CM | POA: Diagnosis not present

## 2019-10-21 DIAGNOSIS — Z170 Estrogen receptor positive status [ER+]: Secondary | ICD-10-CM

## 2019-10-21 DIAGNOSIS — Z5111 Encounter for antineoplastic chemotherapy: Secondary | ICD-10-CM | POA: Diagnosis not present

## 2019-10-21 DIAGNOSIS — Z171 Estrogen receptor negative status [ER-]: Secondary | ICD-10-CM | POA: Diagnosis not present

## 2019-10-21 DIAGNOSIS — Z5112 Encounter for antineoplastic immunotherapy: Secondary | ICD-10-CM | POA: Diagnosis not present

## 2019-10-21 MED ORDER — PEGFILGRASTIM-JMDB 6 MG/0.6ML ~~LOC~~ SOSY
PREFILLED_SYRINGE | SUBCUTANEOUS | Status: AC
  Filled 2019-10-21: qty 0.6

## 2019-10-21 MED ORDER — PEGFILGRASTIM-JMDB 6 MG/0.6ML ~~LOC~~ SOSY
6.0000 mg | PREFILLED_SYRINGE | Freq: Once | SUBCUTANEOUS | Status: AC
  Administered 2019-10-21: 6 mg via SUBCUTANEOUS

## 2019-10-21 NOTE — Patient Instructions (Signed)

## 2019-10-22 ENCOUNTER — Telehealth: Payer: Self-pay | Admitting: *Deleted

## 2019-10-22 NOTE — Telephone Encounter (Signed)
-----   Message from Priscille Loveless, RN sent at 10/19/2019 10:28 AM EDT ----- Regarding: First Chemo Follow up [place] translator First Trastuzumab; Pertuzumab; Docetaxel; Carboplatin follow up.  Spanish Wellsite geologist

## 2019-10-22 NOTE — Telephone Encounter (Signed)
Called pt using Interpreter language line & pt reports doing well with no c/o's except some bone pain but tolerable from pegfilgrastim.  She takes tylenol if needed.  Reminded to call with questions/concerns.

## 2019-10-27 ENCOUNTER — Other Ambulatory Visit: Payer: Self-pay

## 2019-10-27 ENCOUNTER — Encounter: Payer: Self-pay | Admitting: Oncology

## 2019-10-27 ENCOUNTER — Inpatient Hospital Stay: Payer: Medicaid Other

## 2019-10-27 ENCOUNTER — Encounter: Payer: Self-pay | Admitting: *Deleted

## 2019-10-27 ENCOUNTER — Inpatient Hospital Stay (HOSPITAL_BASED_OUTPATIENT_CLINIC_OR_DEPARTMENT_OTHER): Payer: Medicaid Other | Admitting: Oncology

## 2019-10-27 VITALS — BP 108/85 | HR 115 | Temp 98.7°F | Resp 18 | Ht 64.0 in | Wt 201.3 lb

## 2019-10-27 DIAGNOSIS — Z5112 Encounter for antineoplastic immunotherapy: Secondary | ICD-10-CM | POA: Diagnosis not present

## 2019-10-27 DIAGNOSIS — C773 Secondary and unspecified malignant neoplasm of axilla and upper limb lymph nodes: Secondary | ICD-10-CM | POA: Diagnosis not present

## 2019-10-27 DIAGNOSIS — Z5111 Encounter for antineoplastic chemotherapy: Secondary | ICD-10-CM | POA: Diagnosis not present

## 2019-10-27 DIAGNOSIS — Z171 Estrogen receptor negative status [ER-]: Secondary | ICD-10-CM | POA: Diagnosis not present

## 2019-10-27 DIAGNOSIS — Z95828 Presence of other vascular implants and grafts: Secondary | ICD-10-CM

## 2019-10-27 DIAGNOSIS — C50411 Malignant neoplasm of upper-outer quadrant of right female breast: Secondary | ICD-10-CM | POA: Diagnosis not present

## 2019-10-27 DIAGNOSIS — I10 Essential (primary) hypertension: Secondary | ICD-10-CM | POA: Diagnosis not present

## 2019-10-27 DIAGNOSIS — Z170 Estrogen receptor positive status [ER+]: Secondary | ICD-10-CM

## 2019-10-27 DIAGNOSIS — Z79899 Other long term (current) drug therapy: Secondary | ICD-10-CM | POA: Diagnosis not present

## 2019-10-27 LAB — COMPREHENSIVE METABOLIC PANEL
ALT: 139 U/L — ABNORMAL HIGH (ref 0–44)
AST: 66 U/L — ABNORMAL HIGH (ref 15–41)
Albumin: 3.6 g/dL (ref 3.5–5.0)
Alkaline Phosphatase: 166 U/L — ABNORMAL HIGH (ref 38–126)
Anion gap: 10 (ref 5–15)
BUN: 13 mg/dL (ref 6–20)
CO2: 22 mmol/L (ref 22–32)
Calcium: 8.8 mg/dL — ABNORMAL LOW (ref 8.9–10.3)
Chloride: 104 mmol/L (ref 98–111)
Creatinine, Ser: 0.81 mg/dL (ref 0.44–1.00)
GFR calc Af Amer: >60 mL/min (ref >60)
GFR calc non Af Amer: >60 mL/min (ref >60)
Glucose, Bld: 98 mg/dL (ref 70–99)
Potassium: 4.3 mmol/L (ref 3.5–5.1)
Sodium: 136 mmol/L (ref 135–145)
Total Bilirubin: <0.2 mg/dL — ABNORMAL LOW (ref 0.3–1.2)
Total Protein: 7.1 g/dL (ref 6.5–8.1)

## 2019-10-27 LAB — CBC WITH DIFFERENTIAL/PLATELET
Abs Immature Granulocytes: 3.14 10*3/uL — ABNORMAL HIGH (ref 0.00–0.07)
Basophils Absolute: 0 10*3/uL (ref 0.0–0.1)
Basophils Relative: 0 %
Eosinophils Absolute: 0 10*3/uL (ref 0.0–0.5)
Eosinophils Relative: 0 %
HCT: 40.7 % (ref 36.0–46.0)
Hemoglobin: 12.9 g/dL (ref 12.0–15.0)
Immature Granulocytes: 16 %
Lymphocytes Relative: 19 %
Lymphs Abs: 3.9 10*3/uL (ref 0.7–4.0)
MCH: 28.5 pg (ref 26.0–34.0)
MCHC: 31.7 g/dL (ref 30.0–36.0)
MCV: 89.8 fL (ref 80.0–100.0)
Monocytes Absolute: 1.9 10*3/uL — ABNORMAL HIGH (ref 0.1–1.0)
Monocytes Relative: 9 %
Neutro Abs: 11 10*3/uL — ABNORMAL HIGH (ref 1.7–7.7)
Neutrophils Relative %: 56 %
Platelets: 288 10*3/uL (ref 150–400)
RBC: 4.53 MIL/uL (ref 3.87–5.11)
RDW: 12.2 % (ref 11.5–15.5)
WBC: 19.9 10*3/uL — ABNORMAL HIGH (ref 4.0–10.5)
nRBC: 0 % (ref 0.0–0.2)

## 2019-10-27 LAB — PREGNANCY, URINE: Preg Test, Ur: NEGATIVE

## 2019-10-27 MED ORDER — DOXYCYCLINE HYCLATE 100 MG PO TABS
100.0000 mg | ORAL_TABLET | Freq: Every day | ORAL | 1 refills | Status: DC
Start: 2019-10-27 — End: 2020-06-29

## 2019-10-27 MED ORDER — SODIUM CHLORIDE 0.9% FLUSH
10.0000 mL | INTRAVENOUS | Status: DC | PRN
  Administered 2019-10-27: 10 mL via INTRAVENOUS
  Filled 2019-10-27: qty 10

## 2019-10-27 MED ORDER — HEPARIN SOD (PORK) LOCK FLUSH 100 UNIT/ML IV SOLN
500.0000 [IU] | Freq: Once | INTRAVENOUS | Status: AC
  Administered 2019-10-27: 500 [IU] via INTRAVENOUS
  Filled 2019-10-27: qty 5

## 2019-10-27 NOTE — Progress Notes (Signed)
Met with patient/interpreter to obtain signature for [employer].  Patient approved for one-time $1000 [employer] grant to assist with personal expenses while going through treatment. Discussed with interpreter expenses and how they are covered. Patient received a gas card today from the grant.  She has my card for any additional financial questions or concerns.

## 2019-10-27 NOTE — Progress Notes (Signed)
[place]  [phone] [phone]) [phone]     ID: Alice [name] DOB: [birth year]  MR#: [id number]  TGG#:[id number]  Patient Care Team: Drue Flirt, MD as PCP - General (Family Medicine) Mauro Kaufmann, RN as Oncology Nurse Navigator Rockwell Germany, RN as Oncology Nurse Navigator Alphonsa Overall, MD as Consulting Physician (General Surgery) Issabella Rix, Virgie Dad, MD as Consulting Physician (Oncology) Kyung Rudd, MD as Consulting Physician (Radiation Oncology) Chauncey Cruel, MD OTHER MD:  CHIEF COMPLAINT: estrogen receptor negative, Her2 positive breast cancer  CURRENT TREATMENT: Neoadjuvant chemotherapy   INTERVAL HISTORY: Alice [name] returns today for follow up of her estrogen receptor negative, Her2 positive breast cancer, accompanied by a [name] interpreter.   She was started on neoadjuvant chemotherapy on 10/19/2019.  She tolerated it well.  She had some constipation initially.  She then developed diarrhea for several days.  The worst day she had for very loose, watery bowel movements.  She was instructed to use Imodium and Questran and that seemed to work.  She is scheduled for breast MRI on 11/03/2019.   REVIEW OF SYSTEMS: [name] had no fever, and no mouth sores.  The port worked well although today it was hard to get draw back.  She has perverted taste.  Her energy is down a little bit.  She has developed some rosacea.  Aside from these questions she feels the breast is softer and generally she feels better overall.  She is very encouraged by this.   HISTORY OF CURRENT ILLNESS: From the original intake note:  [name] presented with right breast pain with swelling and skin changes (duskiness and dimpling) and left breast pain at 6 o'clock. She underwent bilateral diagnostic mammography with tomography and bilateral breast ultrasonography at [facility] on 09/23/2019 showing: breast density category C; 5.2 cm irregular mass in right breast  spanning 10-12 o'clock, with marked peau d'orange changes concerning for inflammatory component; 3.3 cm enlarged lymph node in right axilla; indeterminate 1 cm oval mass in left breast at 5 o'clock.  Accordingly on 09/30/2019 she proceeded to biopsy of the right breast area in question. The pathology from this procedure ([id number]) showed: invasive mammary carcinoma, grade 3, e-cadherin positive. Prognostic indicators significant for: estrogen receptor, 0% negative and progesterone receptor, 0% negative. Proliferation marker Ki67 at 80%. HER2 positive by immunohistochemistry (3+).  The biopsied right axillary lymph node was positive for metastatic carcinoma.  She also underwent cyst aspiration of the 1 cm mass in the left breast the same day.  The patient's subsequent history is as detailed below.   PAST MEDICAL HISTORY: Past Medical History:  Diagnosis Date  . Hypertension     PAST SURGICAL HISTORY: Past Surgical History:  Procedure Laterality Date  . CHOLECYSTECTOMY    . IR IMAGING GUIDED PORT INSERTION  10/13/2019    FAMILY HISTORY: No family history on file.  As of 09/2019-- her father is 89 and her mother is 41. She has two brothers and one sister. There is no cancer in her family to her knowledge.    GYNECOLOGIC HISTORY:  No LMP recorded. Menarche: 44 years old Age at first live birth: 44 years old GX P 2 LMP regular periods lasting 3 days Contraceptive: previously used, has not used for the last 2 years HRT n/a  Hysterectomy? no BSO? no   SOCIAL HISTORY: (updated 09/2019)  Alice [name] [place]  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 Husband Alice [name] is currently  unemployed but is a good Training and development officer. She lives at home with Alice [name] and their two children. Son Alice [name], age 44, has special needs.  Son Alice [name], age 49, is in high school. She attends     ADVANCED DIRECTIVES: In the absence of any documentation to the contrary, the  patient's spouse is their HCPOA.    HEALTH MAINTENANCE: Social History   Tobacco Use  . Smoking status: Never Smoker  Substance Use Topics  . Alcohol use: Never  . Drug use: Never     Colonoscopy: n/a (age)  PAP: 2020  Bone density: n/a (age)   No Known Allergies  Current Outpatient Medications  Medication Sig Dispense Refill  . dexamethasone (DECADRON) 4 MG tablet Take 2 tablets (8 mg total) by mouth 2 (two) times daily. Start the day before Taxotere. Take once the day after, then 2 times a day x 2d. 30 tablet 1  . ibuprofen (ADVIL,MOTRIN) 200 MG tablet Take 400 mg by mouth every 6 (six) hours as needed for fever, headache or cramping.     . lidocaine-prilocaine (EMLA) cream Apply to affected area once 30 g 3  . lisinopril (ZESTRIL) 10 MG tablet Take 10 mg by mouth daily.    Marland Kitchen loratadine (CLARITIN) 10 MG tablet Take 1 tablet (10 mg total) by mouth daily. 60 tablet 1  . LORazepam (ATIVAN) 0.5 MG tablet Take 1 tablet (0.5 mg total) by mouth at bedtime as needed (Nausea or vomiting). 20 tablet 0  . prochlorperazine (COMPAZINE) 10 MG tablet Take 1 tablet (10 mg total) by mouth every 6 (six) hours as needed (Nausea or vomiting). 30 tablet 1   No current facility-administered medications for this visit.    OBJECTIVE: [place] woman who appears stated age  44:   10/27/19 1008  BP: 108/85  Pulse: (!) 115  Resp: 18  Temp: 98.7 F (37.1 C)  SpO2: 98%     Body mass index is 34.55 kg/m.   Wt Readings from Last 3 Encounters:  10/27/19 201 lb 4.8 oz (91.3 kg)  10/19/19 204 lb 12.8 oz (92.9 kg)  10/15/19 204 lb 8 oz (92.8 kg)      ECOG FS:1 - Symptomatic but completely ambulatory  Sclerae unicteric, EOMs intact Wearing a mask No cervical or supraclavicular adenopathy Lungs no rales or rhonchi Heart regular rate and rhythm Abd soft, nontender, positive bowel sounds MSK no focal spinal tenderness, no upper extremity lymphedema Neuro: nonfocal, well oriented,  appropriate affect Breasts: The right breast is indeed a bit softer than before which is remarkable given as she has only had 1 cycle of chemotherapy.  The left breast is benign.   Right breast 10/06/2019     LAB RESULTS:  CMP     Component Value Date/Time   NA 138 10/19/2019 0756   K 3.8 10/19/2019 0756   CL 102 10/19/2019 0756   CO2 24 10/19/2019 0756   GLUCOSE 113 (H) 10/19/2019 0756   BUN 16 10/19/2019 0756   CREATININE 0.80 10/19/2019 0756   CREATININE 0.79 10/06/2019 0826   CALCIUM 9.3 10/19/2019 0756   PROT 7.7 10/19/2019 0756   ALBUMIN 4.0 10/19/2019 0756   AST 24 10/19/2019 0756   AST 26 10/06/2019 0826   ALT 35 10/19/2019 0756   ALT 47 (H) 10/06/2019 0826   ALKPHOS 94 10/19/2019 0756   BILITOT 0.8 10/19/2019 0756   BILITOT 0.7 10/06/2019 0826   GFRNONAA >60 10/19/2019 0756   GFRNONAA >60 10/06/2019 0826   GFRAA >60  10/19/2019 0756   GFRAA >60 10/06/2019 0826    No results found for: TOTALPROTELP, ALBUMINELP, A1GS, A2GS, BETS, BETA2SER, GAMS, MSPIKE, SPEI  Lab Results  Component Value Date   WBC 19.9 (H) 10/27/2019   NEUTROABS PENDING 10/27/2019   HGB 12.9 10/27/2019   HCT 40.7 10/27/2019   MCV 89.8 10/27/2019   PLT 288 10/27/2019    No results found for: LABCA2  No components found for: YQMVHQ469  No results for input(s): INR in the last 168 hours.  No results found for: LABCA2  No results found for: GEX528  No results found for: UXL244  No results found for: WNU272  No results found for: CA2729  No components found for: HGQUANT  No results found for: CEA1 / No results found for: CEA1   No results found for: AFPTUMOR  No results found for: CHROMOGRNA  No results found for: KPAFRELGTCHN, LAMBDASER, KAPLAMBRATIO (kappa/lambda light chains)  No results found for: HGBA, HGBA2QUANT, HGBFQUANT, HGBSQUAN (Hemoglobinopathy evaluation)   No results found for: LDH  No results found for: IRON, TIBC, IRONPCTSAT (Iron and TIBC)  No  results found for: FERRITIN  Urinalysis No results found for: COLORURINE, APPEARANCEUR, LABSPEC, PHURINE, GLUCOSEU, HGBUR, BILIRUBINUR, KETONESUR, PROTEINUR, UROBILINOGEN, NITRITE, LEUKOCYTESUR   STUDIES: CT Chest W Contrast  Result Date: 10/15/2019 CLINICAL DATA:  44 year old female with history of right-sided breast cancer diagnosed in April 2021. EXAM: CT CHEST, ABDOMEN, AND PELVIS WITH CONTRAST TECHNIQUE: Multidetector CT imaging of the chest, abdomen and pelvis was performed following the standard protocol during bolus administration of intravenous contrast. CONTRAST:  162m OMNIPAQUE IOHEXOL 300 MG/ML  SOLN COMPARISON:  No priors. FINDINGS: CT CHEST FINDINGS Cardiovascular: Heart size is normal. There is no significant pericardial fluid, thickening or pericardial calcification. No atherosclerotic calcifications in the thoracic aorta or the coronary arteries. Left-sided subclavian single-lumen porta cath with tip terminating right atrium. Mediastinum/Nodes: No pathologically enlarged mediastinal or hilar lymph nodes. Esophagus is unremarkable in appearance. No internal mammary lymphadenopathy. Numerous enlarged and borderline enlarged right axillary and subpectoral lymph nodes are noted, measuring up to 1.8 cm in short axis (axial image 15 of series 2). Lungs/Pleura: Tiny 2 mm calcified granuloma in the periphery of the right upper lobe. No other suspicious appearing pulmonary nodules or masses are noted. No acute consolidative airspace disease. No pleural effusions. Musculoskeletal: Diffuse skin thickening in the right breast. Poorly defined infiltrative mass in the right breast estimated to measure approximately 6.9 x 4.0 x 3.9 cm (axial image 26 of series 2 and coronal image 45 of series 5). There are no aggressive appearing lytic or blastic lesions noted in the visualized portions of the skeleton. CT ABDOMEN PELVIS FINDINGS Hepatobiliary: No suspicious cystic or solid hepatic lesions. No intra or  extrahepatic biliary ductal dilatation. Status post cholecystectomy. Pancreas: No pancreatic mass. No pancreatic ductal dilatation. No pancreatic or peripancreatic fluid collections or inflammatory changes. Spleen: Unremarkable. Adrenals/Urinary Tract: Bilateral kidneys and adrenal glands are normal in appearance. No hydroureteronephrosis. Urinary bladder is normal in appearance. Stomach/Bowel: Normal appearance of the stomach. No pathologic dilatation of small bowel or colon. Normal appendix. Vascular/Lymphatic: No significant atherosclerotic disease, aneurysm or dissection noted in the abdominal or pelvic vasculature. No lymphadenopathy noted in the abdomen or pelvis. Reproductive: Uterus and ovaries are unremarkable in appearance. Other: No significant volume of ascites.  No pneumoperitoneum. Musculoskeletal: There are no aggressive appearing lytic or blastic lesions noted in the visualized portions of the skeleton. IMPRESSION: 1. Large heterogeneously enhancing poorly defined infiltrative right breast  mass with diffuse skin thickening in the right breast, and right axillary/subpectoral lymphadenopathy. 2. No other sites of metastatic disease noted elsewhere in the chest, abdomen or pelvis. Electronically Signed   By: Vinnie Langton M.D.   On: 10/15/2019 11:40   NM Bone Scan Whole Body  Result Date: 10/15/2019 CLINICAL DATA:  Metastatic breast cancer. EXAM: NUCLEAR MEDICINE WHOLE BODY BONE SCAN TECHNIQUE: Whole body anterior and posterior images were obtained approximately 3 hours after intravenous injection of radiopharmaceutical. RADIOPHARMACEUTICALS:  Nineteen mCi Technetium-61mMDP IV COMPARISON:  CT scan of the chest abdomen and pelvis dated 10/15/2019 FINDINGS: There are no foci of increased activity in the skeleton suggestive of metastatic disease. There is a single tiny area of increased activity in the posterior aspect of the left calcaneus, likely representing plantar fasciitis or a calcaneal  enthesophyte. IMPRESSION: No evidence of metastatic disease to the skeleton. Electronically Signed   By: JLorriane ShireM.D.   On: 10/15/2019 16:22   CT Abdomen Pelvis W Contrast  Result Date: 10/15/2019 CLINICAL DATA:  44year old female with history of right-sided breast cancer diagnosed in April 2021. EXAM: CT CHEST, ABDOMEN, AND PELVIS WITH CONTRAST TECHNIQUE: Multidetector CT imaging of the chest, abdomen and pelvis was performed following the standard protocol during bolus administration of intravenous contrast. CONTRAST:  105mOMNIPAQUE IOHEXOL 300 MG/ML  SOLN COMPARISON:  No priors. FINDINGS: CT CHEST FINDINGS Cardiovascular: Heart size is normal. There is no significant pericardial fluid, thickening or pericardial calcification. No atherosclerotic calcifications in the thoracic aorta or the coronary arteries. Left-sided subclavian single-lumen porta cath with tip terminating right atrium. Mediastinum/Nodes: No pathologically enlarged mediastinal or hilar lymph nodes. Esophagus is unremarkable in appearance. No internal mammary lymphadenopathy. Numerous enlarged and borderline enlarged right axillary and subpectoral lymph nodes are noted, measuring up to 1.8 cm in short axis (axial image 15 of series 2). Lungs/Pleura: Tiny 2 mm calcified granuloma in the periphery of the right upper lobe. No other suspicious appearing pulmonary nodules or masses are noted. No acute consolidative airspace disease. No pleural effusions. Musculoskeletal: Diffuse skin thickening in the right breast. Poorly defined infiltrative mass in the right breast estimated to measure approximately 6.9 x 4.0 x 3.9 cm (axial image 26 of series 2 and coronal image 45 of series 5). There are no aggressive appearing lytic or blastic lesions noted in the visualized portions of the skeleton. CT ABDOMEN PELVIS FINDINGS Hepatobiliary: No suspicious cystic or solid hepatic lesions. No intra or extrahepatic biliary ductal dilatation. Status post  cholecystectomy. Pancreas: No pancreatic mass. No pancreatic ductal dilatation. No pancreatic or peripancreatic fluid collections or inflammatory changes. Spleen: Unremarkable. Adrenals/Urinary Tract: Bilateral kidneys and adrenal glands are normal in appearance. No hydroureteronephrosis. Urinary bladder is normal in appearance. Stomach/Bowel: Normal appearance of the stomach. No pathologic dilatation of small bowel or colon. Normal appendix. Vascular/Lymphatic: No significant atherosclerotic disease, aneurysm or dissection noted in the abdominal or pelvic vasculature. No lymphadenopathy noted in the abdomen or pelvis. Reproductive: Uterus and ovaries are unremarkable in appearance. Other: No significant volume of ascites.  No pneumoperitoneum. Musculoskeletal: There are no aggressive appearing lytic or blastic lesions noted in the visualized portions of the skeleton. IMPRESSION: 1. Large heterogeneously enhancing poorly defined infiltrative right breast mass with diffuse skin thickening in the right breast, and right axillary/subpectoral lymphadenopathy. 2. No other sites of metastatic disease noted elsewhere in the chest, abdomen or pelvis. Electronically Signed   By: DaVinnie Langton.D.   On: 10/15/2019 11:40   ECHOCARDIOGRAM COMPLETE  Result Date: 10/15/2019    ECHOCARDIOGRAM REPORT   Patient Name:   LILEY RAKE Date of Exam: 10/15/2019 Medical Rec #:  [id number]      Height:       64.0 in Accession #:    [id number]     Weight:       205.6 lb Date of Birth:  01-29-1976      BSA:          1.979 m Patient Age:    67 years       BP:           151/104 mmHg Patient Gender: F              HR:           91 bpm. Exam Location:  Outpatient Procedure: 2D Echo Indications:    Chemo V67.2 / Z09  History:        Patient has no prior history of Echocardiogram examinations.                 Breast cancer.  Sonographer:    Vikki Ports Turrentine Referring Phys: [place]  1. Global longitudinal strain  is -21.9%.. Left ventricular ejection fraction, by estimation, is 65 to 70%. The left ventricle has normal function. The left ventricle has no regional wall motion abnormalities. Left ventricular diastolic parameters were normal.  2. Right ventricular systolic function is normal. The right ventricular size is normal.  3. The mitral valve is normal in structure. Trivial mitral valve regurgitation.  4. The aortic valve is tricuspid. Aortic valve regurgitation is not visualized. Mild aortic valve sclerosis is present, with no evidence of aortic valve stenosis.  5. The inferior vena cava is normal in size with greater than 50% respiratory variability, suggesting right atrial pressure of 3 mmHg. FINDINGS  Left Ventricle: Global longitudinal strain is -21.9%. Left ventricular ejection fraction, by estimation, is 65 to 70%. The left ventricle has normal function. The left ventricle has no regional wall motion abnormalities. The left ventricular internal cavity size was normal in size. There is no left ventricular hypertrophy. Left ventricular diastolic parameters were normal. Right Ventricle: The right ventricular size is normal. No increase in right ventricular wall thickness. Right ventricular systolic function is normal. Left Atrium: Left atrial size was normal in size. Right Atrium: Right atrial size was normal in size. Pericardium: There is no evidence of pericardial effusion. Mitral Valve: The mitral valve is normal in structure. Trivial mitral valve regurgitation. Tricuspid Valve: The tricuspid valve is normal in structure. Tricuspid valve regurgitation is not demonstrated. Aortic Valve: The aortic valve is tricuspid. Aortic valve regurgitation is not visualized. Mild aortic valve sclerosis is present, with no evidence of aortic valve stenosis. Pulmonic Valve: The pulmonic valve was normal in structure. Pulmonic valve regurgitation is not visualized. Aorta: The aortic root is normal in size and structure. Venous:  The inferior vena cava is normal in size with greater than 50% respiratory variability, suggesting right atrial pressure of 3 mmHg. IAS/Shunts: No atrial level shunt detected by color flow Doppler.  LEFT VENTRICLE PLAX 2D LVIDd:         3.65 cm  Diastology LVIDs:         2.40 cm  LV e' lateral:   10.30 cm/s LV PW:         0.90 cm  LV E/e' lateral: 7.4 LV IVS:        1.05 cm  LV e' medial:  7.07 cm/s LVOT diam:     1.80 cm  LV E/e' medial:  10.8 LV SV:         47 LV SV Index:   24 LVOT Area:     2.54 cm  RIGHT VENTRICLE RV S prime:     12.50 cm/s TAPSE (M-mode): 2.0 cm LEFT ATRIUM             Index       RIGHT ATRIUM           Index LA diam:        4.20 cm 2.12 cm/m  RA Area:     13.00 cm LA Vol (A2C):   25.4 ml 12.83 ml/m RA Volume:   30.10 ml  15.21 ml/m LA Vol (A4C):   40.5 ml 20.46 ml/m LA Biplane Vol: 32.8 ml 16.57 ml/m  AORTIC VALVE LVOT Vmax:   106.00 cm/s LVOT Vmean:  67.200 cm/s LVOT VTI:    0.184 m  AORTA Ao Root diam: 3.10 cm MITRAL VALVE MV Area (PHT): 4.96 cm     SHUNTS MV Decel Time: 153 msec     Systemic VTI:  0.18 m MV E velocity: 76.05 cm/s   Systemic Diam: 1.80 cm MV A velocity: 102.00 cm/s MV E/A ratio:  0.75 Dorris Carnes MD Electronically signed by Dorris Carnes MD Signature Date/Time: 10/15/2019/1:57:54 PM    Final    IR IMAGING GUIDED PORT INSERTION  Result Date: 10/13/2019 CLINICAL DATA:  METASTATIC BREAST CANCER EXAM: LEFT INTERNAL JUGULAR SINGLE LUMEN POWER PORT CATHETER INSERTION Date:  10/13/2019 10/13/2019 3:45 pm Radiologist:  Jerilynn Mages. Daryll Brod, MD Guidance:  Ultrasound and fluoroscopic MEDICATIONS: Ancef 2 g; The antibiotic was administered within an appropriate time interval prior to skin puncture. ANESTHESIA/SEDATION: Versed 4.0 mg IV; Fentanyl 100 mcg IV; Moderate Sedation Time:  27 minutes The patient was continuously monitored during the procedure by the interventional radiology nurse under my direct supervision. FLUOROSCOPY TIME:  One minutes, 12 seconds (15 mGy) COMPLICATIONS:  None immediate. CONTRAST:  None. PROCEDURE: Informed consent was obtained from the patient following explanation of the procedure, risks, benefits and alternatives. The patient understands, agrees and consents for the procedure. All questions were addressed. A time out was performed. Maximal barrier sterile technique utilized including caps, mask, sterile gowns, sterile gloves, large sterile drape, hand hygiene, and 2% chlorhexidine scrub. Under sterile conditions and local anesthesia, left internal jugular micropuncture venous access was performed. Access was performed with ultrasound. Images were obtained for documentation of the left internal jugular vein. A guide wire was inserted followed by a transitional dilator. This allowed insertion of a guide wire and catheter into the IVC. Measurements were obtained from the SVC / RA junction back to the left IJ venotomy site. In the left infraclavicular chest, a subcutaneous pocket was created over the second anterior rib. This was done under sterile conditions and local anesthesia. 1% lidocaine with epinephrine was utilized for this. A 2.5 cm incision was made in the skin. Blunt dissection was performed to create a subcutaneous pocket over the right pectoralis major muscle. The pocket was flushed with saline vigorously. There was adequate hemostasis. The port catheter was assembled and checked for leakage. The port catheter was secured in the pocket with two retention sutures. The tubing was tunneled subcutaneously to the left venotomy site and inserted into the SVC/RA junction through a valved peel-away sheath. Position was confirmed with fluoroscopy. Images were obtained for documentation. The patient tolerated the procedure well. No immediate  complications. Incisions were closed in a two layer fashion with 4 - 0 Vicryl suture. Dermabond was applied to the skin. The port catheter was accessed, blood was aspirated followed by saline and heparin flushes. Needle was  removed. A dry sterile dressing was applied. IMPRESSION: Ultrasound and fluoroscopically guided left internal jugular single lumen power port catheter insertion. Tip in the SVC/RA junction. Catheter ready for use. Electronically Signed   By: Jerilynn Mages.  Shick M.D.   On: 10/13/2019 15:58     ELIGIBLE FOR AVAILABLE RESEARCH PROTOCOL: no  ASSESSMENT: 44 y.o. [place] speaker status post right breast upper outer quadrant sites biopsy and right axillary lymph node biopsy 09/30/2019 for a clinical T3 N2, stage IIIA invasive ductal carcinoma, grade 3, estrogen and progesterone receptor negative, HER-2 amplified, with an MIB-1 of 80%.  (a) CT chest abdomen and pelvis and bone scan 10/15/2019 showed no evidence of metastatic disease  (1) genetics testing 10/13/2019 through the Common Hereditary Cancers Panel offered by [facility] found no deleterious mutations in APC, ATM, AXIN2, BARD1, BMPR1A, BRCA1, BRCA2, BRIP1, CDH1, CDKN2A (p14ARF), CDKN2A (p16INK4a), CKD4, CHEK2, CTNNA1, DICER1, EPCAM (Deletion/duplication testing only), GREM1 (promoter region deletion/duplication testing only), KIT, MEN1, MLH1, MSH2, MSH3, MSH6, MUTYH, NBN, NF1, NHTL1, PALB2, PDGFRA, PMS2, POLD1, POLE, PTEN, RAD50, RAD51C, RAD51D, RNF43, SDHB, SDHC, SDHD, SMAD4, SMARCA4. STK11, TP53, TSC1, TSC2, and VHL.  The following genes were evaluated for sequence changes only: SDHA and HOXB13 c.251G>A variant only.  (2) neoadjuvant chemotherapy to consist of trastuzumab, pertuzumab, docetaxel and carboplatin every 21 days x 6 starting 10/19/2019  (3) trastuzumab and pertuzumab to be continued to total 1 year  (a) echo 10/15/2019 shows an ejection fraction in the 65-70% range.  (4) definitive surgery to follow  (5) adjuvant radiation.  PLAN:  Ashanti did remarkably well with her first cycle of chemotherapy and immunotherapy.  She was initially a little constipated and easily took care of that.  She then had diarrhea.  The worst was for bowel  movements in 1 day.  She took care of that with Imodium and she also has Questran on board.  She has developed some rosacea doubtless secondary to the steroids.  We are going to start daily doxycycline which I think will take care of that.  At this point given her very good count I think we can see her every 3 weeks.  I have urged her to let us know if the diarrhea becomes more of a problem or if she has any other problem.  She is very encouraged because she feels better and her breast is clearly softer  Total encounter time 30 minutes.Sarajane Jews C. Estaban [place], MD 10/27/2019 10:21 AM Medical Oncology and Hematology [facility] [place], [place] [address] Tel. [phone]    Fax. [phone]   This document serves as a record of services personally performed by Lurline Del, MD. It was created on his behalf by Wilburn Mylar, a trained medical scribe. The creation of this record is based on the scribe's personal observations and the provider's statements to them.   I, Lurline Del MD, have reviewed the above documentation for accuracy and completeness, and I agree with the above.   *Total Encounter Time as defined by the Centers for Medicare and Medicaid Services includes, in addition to the face-to-face time of a patient visit (documented in the note above) non-face-to-face time: obtaining and reviewing outside history, ordering and reviewing medications, tests or procedures, care coordination (communications with  other health care professionals or caregivers) and documentation in the medical record.

## 2019-10-28 ENCOUNTER — Telehealth: Payer: Self-pay | Admitting: Oncology

## 2019-10-28 NOTE — Telephone Encounter (Signed)
Added office visits to already scheduled treatments per [facility] 5/19 los. [facility] Almyra Free is calling pt to inform of June 1st appt. Added appt note to 6/1 appt for pt to get updated appt calendar through September.

## 2019-11-02 NOTE — Progress Notes (Signed)
Pharmacist Chemotherapy Monitoring - Follow Up Assessment    I verify that I have reviewed each item in the below checklist:  . Regimen for the patient is scheduled for the appropriate day and plan matches scheduled date. Marland Kitchen Appropriate non-routine labs are ordered dependent on drug ordered. . If applicable, additional medications reviewed and ordered per protocol based on lifetime cumulative doses and/or treatment regimen.   Plan for follow-up and/or issues identified: No . I-vent associated with next due treatment: No . MD and/or nursing notified: No  Mady Oubre D 11/02/2019 4:03 PM

## 2019-11-03 ENCOUNTER — Ambulatory Visit (HOSPITAL_COMMUNITY)
Admission: RE | Admit: 2019-11-03 | Discharge: 2019-11-03 | Disposition: A | Discharge status: Home / Self Care | Payer: Medicaid Other | Source: Ambulatory Visit | Attending: Oncology | Admitting: Oncology

## 2019-11-03 ENCOUNTER — Other Ambulatory Visit: Payer: Self-pay

## 2019-11-03 DIAGNOSIS — C50411 Malignant neoplasm of upper-outer quadrant of right female breast: Secondary | ICD-10-CM

## 2019-11-03 MED ORDER — GADOBUTROL 1 MMOL/ML IV SOLN: 10.0000 mL | Freq: Once | INTRAVENOUS | Status: DC | PRN

## 2019-11-03 NOTE — Progress Notes (Signed)
Attempted patient for Breast MRI, pts Breasts are too large for our Breast coil. Patient needs to be RS and attempted at GI. Spoke with Val RN   9:42a

## 2019-11-04 ENCOUNTER — Other Ambulatory Visit: Payer: Self-pay | Admitting: *Deleted

## 2019-11-04 DIAGNOSIS — Z170 Estrogen receptor positive status [ER+]: Secondary | ICD-10-CM

## 2019-11-08 NOTE — Progress Notes (Signed)
Alice [name]  [phone] [phone]     ID: Alice [name] DOB: 1976-02-11  MR#: [id number]  STM#:[id number]  Patient Care Team: Alice Flirt, MD as PCP - General (Family Medicine) Alice Kaufmann, RN as Oncology Nurse Navigator Alice Germany, RN as Oncology Nurse Navigator Alice Overall, MD as Consulting Physician (General Surgery) Alice [name], Alice Dad, MD as Consulting Physician (Oncology) Alice Rudd, MD as Consulting Physician (Radiation Oncology) Alice Cruel, MD OTHER MD:  CHIEF COMPLAINT: estrogen receptor negative, Her2 positive breast cancer  CURRENT TREATMENT: Neoadjuvant chemotherapy   INTERVAL HISTORY: Alice [name] returns today for follow up and treatment of her estrogen receptor negative, Her2 positive breast cancer, accompanied by a [name] interpreter.   She continues on neoadjuvant chemotherapy. Today is day 1 cycle 2.   She had excelent nadir counts and did not need prophylactic antibiotics. Accordingly the plan is to see her on treatment day and not in between unless probems develop.  She was scheduled for breast MRI on 11/03/2019. Per note from the radiologist, the patient's breasts are too large for the breast coil at [facility]. She was recommended to be rescheduled at GI. The order is in place but has not yet been scheduled.   REVIEW OF SYSTEMS: Alice [name] lost her hair.  She is wearing a turban.  She tells me her right breast is much softer, she does not feel the mass anymore and the skin looks different.  She has a little bit of constipation and then a little bit of diarrhea almost every day, with about 2 or 3 liquid bowel movements in the morning but then nothing the rest of the day.  She is easily drinking liquids and keeping herself hydrated.  She does not like to use the Imodium because it constipates her.  She has no mouth sores.  Sense of taste is better.  She has no peripheral neuropathy neuropathy.  She has had no  menstrual periods since chemotherapy started.   HISTORY OF CURRENT ILLNESS: From the original intake note:  Alice [name] presented with right breast pain with swelling and skin changes (duskiness and dimpling) and left breast pain at 6 o'clock. She underwent bilateral diagnostic mammography with tomography and bilateral breast ultrasonography at [facility] on 09/23/2019 showing: breast density category C; 5.2 cm irregular mass in right breast spanning 10-12 o'clock, with marked peau d'orange changes concerning for inflammatory component; 3.3 cm enlarged lymph node in right axilla; indeterminate 1 cm oval mass in left breast at 5 o'clock.  Accordingly on 09/30/2019 she proceeded to biopsy of the right breast area in question. The pathology from this procedure ([id number]) showed: invasive mammary carcinoma, grade 3, e-cadherin positive. Prognostic indicators significant for: estrogen receptor, 0% negative and progesterone receptor, 0% negative. Proliferation marker Ki67 at 80%. HER2 positive by immunohistochemistry (3+).  The biopsied right axillary lymph node was positive for metastatic carcinoma.  She also underwent cyst aspiration of the 1 cm mass in the left breast the same day.  The patient's subsequent history is as detailed below.   PAST MEDICAL HISTORY: Past Medical History:  Diagnosis Date  . Hypertension     PAST SURGICAL HISTORY: Past Surgical History:  Procedure Laterality Date  . CHOLECYSTECTOMY    . IR IMAGING GUIDED PORT INSERTION  10/13/2019    FAMILY HISTORY: No family history on file.  As of 09/2019-- her father is 38 and her mother is 27. She has two brothers and one sister. There is no cancer in  her family to her knowledge.    GYNECOLOGIC HISTORY:  No LMP recorded. Menarche: 44 years old Age at first live birth: 44 years old GX P 2 LMP regular periods lasting 3 days Contraceptive: previously used, has not used for the last 2 years HRT n/a  Hysterectomy? no BSO?  no   SOCIAL HISTORY: (updated 09/2019)  Alice [name] [place]  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 Husband Alice [name] is currently unemployed but is a good Training and development officer. She lives at home with Alice [name] and their two children. Son Alice [name], age 53, has special needs.  Son Alice [name], age 45, is in high school. She attends     ADVANCED DIRECTIVES: In the absence of any documentation to the contrary, the patient's spouse is their HCPOA.    HEALTH MAINTENANCE: Social History   Tobacco Use  . Smoking status: Never Smoker  Substance Use Topics  . Alcohol use: Never  . Drug use: Never     Colonoscopy: n/a (age)  PAP: 2020  Bone density: n/a (age)   No Known Allergies  Current Outpatient Medications  Medication Sig Dispense Refill  . dexamethasone (DECADRON) 4 MG tablet Take 2 tablets (8 mg total) by mouth 2 (two) times daily. Start the day before Taxotere. Take once the day after, then 2 times a day x 2d. 30 tablet 1  . doxycycline (VIBRA-TABS) 100 MG tablet Take 1 tablet (100 mg total) by mouth daily. 60 tablet 1  . ibuprofen (ADVIL,MOTRIN) 200 MG tablet Take 400 mg by mouth every 6 (six) hours as needed for fever, headache or cramping.     . lidocaine-prilocaine (EMLA) cream Apply to affected area once 30 g 3  . lisinopril (ZESTRIL) 10 MG tablet Take 10 mg by mouth daily.    Marland Kitchen loratadine (CLARITIN) 10 MG tablet Take 1 tablet (10 mg total) by mouth daily. 60 tablet 1  . LORazepam (ATIVAN) 0.5 MG tablet Take 1 tablet (0.5 mg total) by mouth at bedtime as needed (Nausea or vomiting). 20 tablet 0  . prochlorperazine (COMPAZINE) 10 MG tablet Take 1 tablet (10 mg total) by mouth every 6 (six) hours as needed (Nausea or vomiting). 30 tablet 1   No current facility-administered medications for this visit.    OBJECTIVE: [place] woman in no acute distress  Vitals:   11/09/19 0806  BP: (!) 145/93  Pulse: 100  Resp: 18  Temp: 98.7 F (37.1 C)    SpO2: 98%     Body mass index is 35.07 kg/m.   Wt Readings from Last 3 Encounters:  11/09/19 204 lb 4.8 oz (92.7 kg)  10/27/19 201 lb 4.8 oz (91.3 kg)  10/19/19 204 lb 12.8 oz (92.9 kg)      ECOG FS:1 - Symptomatic but completely ambulatory  Sclerae unicteric, EOMs intact Wearing a mask No cervical or supraclavicular adenopathy Lungs no rales or rhonchi Heart regular rate and rhythm Abd soft, nontender, positive bowel sounds MSK no focal spinal tenderness, no upper extremity lymphedema Neuro: nonfocal, well oriented, appropriate affect Breasts: The right breast now appears unremarkable as compared to the left.  There is no erythema and no palpable mass.  The left breast is benign.  Both axillae are benign.    Right breast 10/06/2019     LAB RESULTS:  CMP     Component Value Date/Time   NA 136 10/27/2019 0838   K 4.3 10/27/2019 0838   CL 104 10/27/2019 8032  CO2 22 10/27/2019 0838   GLUCOSE 98 10/27/2019 0838   BUN 13 10/27/2019 0838   CREATININE 0.81 10/27/2019 0838   CREATININE 0.79 10/06/2019 0826   CALCIUM 8.8 (L) 10/27/2019 0838   PROT 7.1 10/27/2019 0838   ALBUMIN 3.6 10/27/2019 0838   AST 66 (H) 10/27/2019 0838   AST 26 10/06/2019 0826   ALT 139 (H) 10/27/2019 0838   ALT 47 (H) 10/06/2019 0826   ALKPHOS 166 (H) 10/27/2019 0838   BILITOT <0.2 (L) 10/27/2019 0838   BILITOT 0.7 10/06/2019 0826   GFRNONAA >60 10/27/2019 0838   GFRNONAA >60 10/06/2019 0826   GFRAA >60 10/27/2019 0838   GFRAA >60 10/06/2019 0826    No results found for: TOTALPROTELP, ALBUMINELP, A1GS, A2GS, BETS, BETA2SER, GAMS, MSPIKE, SPEI  Lab Results  Component Value Date   WBC 19.9 (H) 10/27/2019   NEUTROABS 11.0 (H) 10/27/2019   HGB 12.9 10/27/2019   HCT 40.7 10/27/2019   MCV 89.8 10/27/2019   PLT 288 10/27/2019    No results found for: LABCA2  No components found for: [id number]  No results for input(s): INR in the last 168 hours.  No results found for: LABCA2  No  results found for: JQB341  No results found for: PFX902  No results found for: IOX735  No results found for: CA2729  No components found for: HGQUANT  No results found for: CEA1 / No results found for: CEA1   No results found for: AFPTUMOR  No results found for: CHROMOGRNA  No results found for: KPAFRELGTCHN, LAMBDASER, KAPLAMBRATIO (kappa/lambda light chains)  No results found for: HGBA, HGBA2QUANT, HGBFQUANT, HGBSQUAN (Hemoglobinopathy evaluation)   No results found for: LDH  No results found for: IRON, TIBC, IRONPCTSAT (Iron and TIBC)  No results found for: FERRITIN  Urinalysis No results found for: COLORURINE, APPEARANCEUR, LABSPEC, PHURINE, GLUCOSEU, HGBUR, BILIRUBINUR, KETONESUR, PROTEINUR, UROBILINOGEN, NITRITE, LEUKOCYTESUR   STUDIES: CT Chest W Contrast  Result Date: 10/15/2019 CLINICAL DATA:  44 year old female with history of right-sided breast cancer diagnosed in April 2021. EXAM: CT CHEST, ABDOMEN, AND PELVIS WITH CONTRAST TECHNIQUE: Multidetector CT imaging of the chest, abdomen and pelvis was performed following the standard protocol during bolus administration of intravenous contrast. CONTRAST:  145m OMNIPAQUE IOHEXOL 300 MG/ML  SOLN COMPARISON:  No priors. FINDINGS: CT CHEST FINDINGS Cardiovascular: Heart size is normal. There is no significant pericardial fluid, thickening or pericardial calcification. No atherosclerotic calcifications in the thoracic aorta or the coronary arteries. Left-sided subclavian single-lumen porta cath with tip terminating right atrium. Mediastinum/Nodes: No pathologically enlarged mediastinal or hilar lymph nodes. Esophagus is unremarkable in appearance. No internal mammary lymphadenopathy. Numerous enlarged and borderline enlarged right axillary and subpectoral lymph nodes are noted, measuring up to 1.8 cm in short axis (axial image 15 of series 2). Lungs/Pleura: Tiny 2 mm calcified granuloma in the periphery of the right upper lobe.  No other suspicious appearing pulmonary nodules or masses are noted. No acute consolidative airspace disease. No pleural effusions. Musculoskeletal: Diffuse skin thickening in the right breast. Poorly defined infiltrative mass in the right breast estimated to measure approximately 6.9 x 4.0 x 3.9 cm (axial image 26 of series 2 and coronal image 45 of series 5). There are no aggressive appearing lytic or blastic lesions noted in the visualized portions of the skeleton. CT ABDOMEN PELVIS FINDINGS Hepatobiliary: No suspicious cystic or solid hepatic lesions. No intra or extrahepatic biliary ductal dilatation. Status post cholecystectomy. Pancreas: No pancreatic mass. No pancreatic ductal dilatation. No pancreatic or  peripancreatic fluid collections or inflammatory changes. Spleen: Unremarkable. Adrenals/Urinary Tract: Bilateral kidneys and adrenal glands are normal in appearance. No hydroureteronephrosis. Urinary bladder is normal in appearance. Stomach/Bowel: Normal appearance of the stomach. No pathologic dilatation of small bowel or colon. Normal appendix. Vascular/Lymphatic: No significant atherosclerotic disease, aneurysm or dissection noted in the abdominal or pelvic vasculature. No lymphadenopathy noted in the abdomen or pelvis. Reproductive: Uterus and ovaries are unremarkable in appearance. Other: No significant volume of ascites.  No pneumoperitoneum. Musculoskeletal: There are no aggressive appearing lytic or blastic lesions noted in the visualized portions of the skeleton. IMPRESSION: 1. Large heterogeneously enhancing poorly defined infiltrative right breast mass with diffuse skin thickening in the right breast, and right axillary/subpectoral lymphadenopathy. 2. No other sites of metastatic disease noted elsewhere in the chest, abdomen or pelvis. Electronically Signed   By: Vinnie Langton M.D.   On: 10/15/2019 11:40   NM Bone Scan Whole Body  Result Date: 10/15/2019 CLINICAL DATA:  Metastatic breast  cancer. EXAM: NUCLEAR MEDICINE WHOLE BODY BONE SCAN TECHNIQUE: Whole body anterior and posterior images were obtained approximately 3 hours after intravenous injection of radiopharmaceutical. RADIOPHARMACEUTICALS:  Nineteen mCi Technetium-45mMDP IV COMPARISON:  CT scan of the chest abdomen and pelvis dated 10/15/2019 FINDINGS: There are no foci of increased activity in the skeleton suggestive of metastatic disease. There is a single tiny area of increased activity in the posterior aspect of the left calcaneus, likely representing plantar fasciitis or a calcaneal enthesophyte. IMPRESSION: No evidence of metastatic disease to the skeleton. Electronically Signed   By: JLorriane ShireM.D.   On: 10/15/2019 16:22   CT Abdomen Pelvis W Contrast  Result Date: 10/15/2019 CLINICAL DATA:  44year old female with history of right-sided breast cancer diagnosed in April 2021. EXAM: CT CHEST, ABDOMEN, AND PELVIS WITH CONTRAST TECHNIQUE: Multidetector CT imaging of the chest, abdomen and pelvis was performed following the standard protocol during bolus administration of intravenous contrast. CONTRAST:  1015mOMNIPAQUE IOHEXOL 300 MG/ML  SOLN COMPARISON:  No priors. FINDINGS: CT CHEST FINDINGS Cardiovascular: Heart size is normal. There is no significant pericardial fluid, thickening or pericardial calcification. No atherosclerotic calcifications in the thoracic aorta or the coronary arteries. Left-sided subclavian single-lumen porta cath with tip terminating right atrium. Mediastinum/Nodes: No pathologically enlarged mediastinal or hilar lymph nodes. Esophagus is unremarkable in appearance. No internal mammary lymphadenopathy. Numerous enlarged and borderline enlarged right axillary and subpectoral lymph nodes are noted, measuring up to 1.8 cm in short axis (axial image 15 of series 2). Lungs/Pleura: Tiny 2 mm calcified granuloma in the periphery of the right upper lobe. No other suspicious appearing pulmonary nodules or masses  are noted. No acute consolidative airspace disease. No pleural effusions. Musculoskeletal: Diffuse skin thickening in the right breast. Poorly defined infiltrative mass in the right breast estimated to measure approximately 6.9 x 4.0 x 3.9 cm (axial image 26 of series 2 and coronal image 45 of series 5). There are no aggressive appearing lytic or blastic lesions noted in the visualized portions of the skeleton. CT ABDOMEN PELVIS FINDINGS Hepatobiliary: No suspicious cystic or solid hepatic lesions. No intra or extrahepatic biliary ductal dilatation. Status post cholecystectomy. Pancreas: No pancreatic mass. No pancreatic ductal dilatation. No pancreatic or peripancreatic fluid collections or inflammatory changes. Spleen: Unremarkable. Adrenals/Urinary Tract: Bilateral kidneys and adrenal glands are normal in appearance. No hydroureteronephrosis. Urinary bladder is normal in appearance. Stomach/Bowel: Normal appearance of the stomach. No pathologic dilatation of small bowel or colon. Normal appendix. Vascular/Lymphatic: No significant atherosclerotic  disease, aneurysm or dissection noted in the abdominal or pelvic vasculature. No lymphadenopathy noted in the abdomen or pelvis. Reproductive: Uterus and ovaries are unremarkable in appearance. Other: No significant volume of ascites.  No pneumoperitoneum. Musculoskeletal: There are no aggressive appearing lytic or blastic lesions noted in the visualized portions of the skeleton. IMPRESSION: 1. Large heterogeneously enhancing poorly defined infiltrative right breast mass with diffuse skin thickening in the right breast, and right axillary/subpectoral lymphadenopathy. 2. No other sites of metastatic disease noted elsewhere in the chest, abdomen or pelvis. Electronically Signed   By: Vinnie Langton M.D.   On: 10/15/2019 11:40   ECHOCARDIOGRAM COMPLETE  Result Date: 10/15/2019    ECHOCARDIOGRAM REPORT   Patient Name:   PAMELIA BOTTO Date of Exam: 10/15/2019 Medical Rec  #:  [id number]      Height:       64.0 in Accession #:    [id number]     Weight:       205.6 lb Date of Birth:  28-Apr-1976      BSA:          1.979 m Patient Age:    26 years       BP:           151/104 mmHg Patient Gender: F              HR:           91 bpm. Exam Location:  Outpatient Procedure: 2D Echo Indications:    Chemo V67.2 / Z09  History:        Patient has no prior history of Echocardiogram examinations.                 Breast cancer.  Sonographer:    Vikki Ports Turrentine Referring Phys: Fer[place]  1. Global longitudinal strain is -21.9%.. Left ventricular ejection fraction, by estimation, is 65 to 70%. The left ventricle has normal function. The left ventricle has no regional wall motion abnormalities. Left ventricular diastolic parameters were normal.  2. Right ventricular systolic function is normal. The right ventricular size is normal.  3. The mitral valve is normal in structure. Trivial mitral valve regurgitation.  4. The aortic valve is tricuspid. Aortic valve regurgitation is not visualized. Mild aortic valve sclerosis is present, with no evidence of aortic valve stenosis.  5. The inferior vena cava is normal in size with greater than 50% respiratory variability, suggesting right atrial pressure of 3 mmHg. FINDINGS  Left Ventricle: Global longitudinal strain is -21.9%. Left ventricular ejection fraction, by estimation, is 65 to 70%. The left ventricle has normal function. The left ventricle has no regional wall motion abnormalities. The left ventricular internal cavity size was normal in size. There is no left ventricular hypertrophy. Left ventricular diastolic parameters were normal. Right Ventricle: The right ventricular size is normal. No increase in right ventricular wall thickness. Right ventricular systolic function is normal. Left Atrium: Left atrial size was normal in size. Right Atrium: Right atrial size was normal in size. Pericardium: There is no evidence of  pericardial effusion. Mitral Valve: The mitral valve is normal in structure. Trivial mitral valve regurgitation. Tricuspid Valve: The tricuspid valve is normal in structure. Tricuspid valve regurgitation is not demonstrated. Aortic Valve: The aortic valve is tricuspid. Aortic valve regurgitation is not visualized. Mild aortic valve sclerosis is present, with no evidence of aortic valve stenosis. Pulmonic Valve: The pulmonic valve was normal in structure. Pulmonic valve regurgitation is not visualized. Aorta: The  aortic root is normal in size and structure. Venous: The inferior vena cava is normal in size with greater than 50% respiratory variability, suggesting right atrial pressure of 3 mmHg. IAS/Shunts: No atrial level shunt detected by color flow Doppler.  LEFT VENTRICLE PLAX 2D LVIDd:         3.65 cm  Diastology LVIDs:         2.40 cm  LV e' lateral:   10.30 cm/s LV PW:         0.90 cm  LV E/e' lateral: 7.4 LV IVS:        1.05 cm  LV e' medial:    7.07 cm/s LVOT diam:     1.80 cm  LV E/e' medial:  10.8 LV SV:         47 LV SV Index:   24 LVOT Area:     2.54 cm  RIGHT VENTRICLE RV S prime:     12.50 cm/s TAPSE (M-mode): 2.0 cm LEFT ATRIUM             Index       RIGHT ATRIUM           Index LA diam:        4.20 cm 2.12 cm/m  RA Area:     13.00 cm LA Vol (A2C):   25.4 ml 12.83 ml/m RA Volume:   30.10 ml  15.21 ml/m LA Vol (A4C):   40.5 ml 20.46 ml/m LA Biplane Vol: 32.8 ml 16.57 ml/m  AORTIC VALVE LVOT Vmax:   106.00 cm/s LVOT Vmean:  67.200 cm/s LVOT VTI:    0.184 m  AORTA Ao Root diam: 3.10 cm MITRAL VALVE MV Area (PHT): 4.96 cm     SHUNTS MV Decel Time: 153 msec     Systemic VTI:  0.18 m MV E velocity: 76.05 cm/s   Systemic Diam: 1.80 cm MV A velocity: 102.00 cm/s MV E/A ratio:  0.75 Dorris Carnes MD Electronically signed by Dorris Carnes MD Signature Date/Time: 10/15/2019/1:57:54 PM    Final    IR IMAGING GUIDED PORT INSERTION  Result Date: 10/13/2019 CLINICAL DATA:  METASTATIC BREAST CANCER EXAM: LEFT  INTERNAL JUGULAR SINGLE LUMEN POWER PORT CATHETER INSERTION Date:  10/13/2019 10/13/2019 3:45 pm Radiologist:  Jerilynn Mages. Daryll Brod, MD Guidance:  Ultrasound and fluoroscopic MEDICATIONS: Ancef 2 g; The antibiotic was administered within an appropriate time interval prior to skin puncture. ANESTHESIA/SEDATION: Versed 4.0 mg IV; Fentanyl 100 mcg IV; Moderate Sedation Time:  27 minutes The patient was continuously monitored during the procedure by the interventional radiology nurse under my direct supervision. FLUOROSCOPY TIME:  One minutes, 12 seconds (15 mGy) COMPLICATIONS: None immediate. CONTRAST:  None. PROCEDURE: Informed consent was obtained from the patient following explanation of the procedure, risks, benefits and alternatives. The patient understands, agrees and consents for the procedure. All questions were addressed. A time out was performed. Maximal barrier sterile technique utilized including caps, mask, sterile gowns, sterile gloves, large sterile drape, hand hygiene, and 2% chlorhexidine scrub. Under sterile conditions and local anesthesia, left internal jugular micropuncture venous access was performed. Access was performed with ultrasound. Images were obtained for documentation of the left internal jugular vein. A guide wire was inserted followed by a transitional dilator. This allowed insertion of a guide wire and catheter into the IVC. Measurements were obtained from the SVC / RA junction back to the left IJ venotomy site. In the left infraclavicular chest, a subcutaneous pocket was created over the second anterior rib.  This was done under sterile conditions and local anesthesia. 1% lidocaine with epinephrine was utilized for this. A 2.5 cm incision was made in the skin. Blunt dissection was performed to create a subcutaneous pocket over the right pectoralis major muscle. The pocket was flushed with saline vigorously. There was adequate hemostasis. The port catheter was assembled and checked for leakage.  The port catheter was secured in the pocket with two retention sutures. The tubing was tunneled subcutaneously to the left venotomy site and inserted into the SVC/RA junction through a valved peel-away sheath. Position was confirmed with fluoroscopy. Images were obtained for documentation. The patient tolerated the procedure well. No immediate complications. Incisions were closed in a two layer fashion with 4 - 0 Vicryl suture. Dermabond was applied to the skin. The port catheter was accessed, blood was aspirated followed by saline and heparin flushes. Needle was removed. A dry sterile dressing was applied. IMPRESSION: Ultrasound and fluoroscopically guided left internal jugular single lumen power port catheter insertion. Tip in the SVC/RA junction. Catheter ready for use. Electronically Signed   By: Jerilynn Mages.  Shick M.D.   On: 10/13/2019 15:58     ELIGIBLE FOR AVAILABLE RESEARCH PROTOCOL: no  ASSESSMENT: 44 y.o. [place] speaker status post right breast upper outer quadrant sites biopsy and right axillary lymph node biopsy 09/30/2019 for a clinical T3 N2, stage IIIA invasive ductal carcinoma, grade 3, estrogen and progesterone receptor negative, HER-2 amplified, with an MIB-1 of 80%.  (a) CT chest abdomen and pelvis and bone scan 10/15/2019 showed no evidence of metastatic disease  (1) genetics testing 10/13/2019 through the Common Hereditary Cancers Panel offered by [facility] found no deleterious mutations in APC, ATM, AXIN2, BARD1, BMPR1A, BRCA1, BRCA2, BRIP1, CDH1, CDKN2A (p14ARF), CDKN2A (p16INK4a), CKD4, CHEK2, CTNNA1, DICER1, EPCAM (Deletion/duplication testing only), GREM1 (promoter region deletion/duplication testing only), KIT, MEN1, MLH1, MSH2, MSH3, MSH6, MUTYH, NBN, NF1, NHTL1, PALB2, PDGFRA, PMS2, POLD1, POLE, PTEN, RAD50, RAD51C, RAD51D, RNF43, SDHB, SDHC, SDHD, SMAD4, SMARCA4. STK11, TP53, TSC1, TSC2, and VHL.  The following genes were evaluated for sequence changes only: SDHA and HOXB13  c.251G>A variant only.  (2) neoadjuvant chemotherapy to consist of trastuzumab, pertuzumab, docetaxel and carboplatin every 21 days x 6 starting 10/19/2019  (3) trastuzumab and pertuzumab to be continued to total 1 year  (a) echo 10/15/2019 shows an ejection fraction in the 65-70% range.  (4) definitive surgery to follow  (5) adjuvant radiation.   PLAN:  Edeline is tolerating chemotherapy and immunotherapy well and she will receive her second cycle today.  We will try to schedule an MRI of the breast at [facility] in the next week or so which will have to [name] as the baseline since we were not able to get this scheduled through [place] [facility] given the size of the patient's breasts.  We discussed the diarrhea issue at length.  She is managing this quite well simply by taking a lot of liquids during the day.  She knows how to use the Imodium although she does not like to use it.  I urged her if she has significant diarrhea to use the Imodium and also call us.  I let her know that we can bring her in for fluids.  Given that discussion I am continuing the Pertuzumab cycle 2 as before.  We can always consider omitting it with subsequent cycles  She will see me again in 3 weeks.  Note that she has not had a.  Since April and she understands she may  or may not get her periods back after chemotherapy is done  Total encounter time 25 minutes.Sarajane Jews C. Marissah Vandemark, MD 11/09/2019 8:18 AM Medical Oncology and Hematology [facility] [place], [place] [address] Tel. [phone]    Fax. [phone]   This document serves as a record of services personally performed by Lurline Del, MD. It was created on his behalf by Wilburn Mylar, a trained medical scribe. The creation of this record is based on the scribe's personal observations and the provider's statements to them.   I, Lurline Del MD, have reviewed the above documentation for accuracy and  completeness, and I agree with the above.   *Total Encounter Time as defined by the Centers for Medicare and Medicaid Services includes, in addition to the face-to-face time of a patient visit (documented in the note above) non-face-to-face time: obtaining and reviewing outside history, ordering and reviewing medications, tests or procedures, care coordination (communications with other health care professionals or caregivers) and documentation in the medical record.

## 2019-11-09 ENCOUNTER — Telehealth: Payer: Self-pay | Admitting: *Deleted

## 2019-11-09 ENCOUNTER — Other Ambulatory Visit: Payer: Self-pay

## 2019-11-09 ENCOUNTER — Inpatient Hospital Stay (HOSPITAL_BASED_OUTPATIENT_CLINIC_OR_DEPARTMENT_OTHER): Payer: Medicaid Other | Admitting: Oncology

## 2019-11-09 ENCOUNTER — Encounter: Payer: Self-pay | Admitting: *Deleted

## 2019-11-09 ENCOUNTER — Inpatient Hospital Stay: Payer: Medicaid Other

## 2019-11-09 ENCOUNTER — Inpatient Hospital Stay: Payer: Medicaid Other | Attending: Oncology

## 2019-11-09 VITALS — BP 145/93 | HR 100 | Temp 98.7°F | Resp 18 | Ht 64.0 in | Wt 204.3 lb

## 2019-11-09 DIAGNOSIS — C50411 Malignant neoplasm of upper-outer quadrant of right female breast: Secondary | ICD-10-CM | POA: Diagnosis not present

## 2019-11-09 DIAGNOSIS — Z5111 Encounter for antineoplastic chemotherapy: Secondary | ICD-10-CM | POA: Diagnosis not present

## 2019-11-09 DIAGNOSIS — Z5112 Encounter for antineoplastic immunotherapy: Secondary | ICD-10-CM | POA: Insufficient documentation

## 2019-11-09 DIAGNOSIS — Z5189 Encounter for other specified aftercare: Secondary | ICD-10-CM | POA: Insufficient documentation

## 2019-11-09 DIAGNOSIS — Z170 Estrogen receptor positive status [ER+]: Secondary | ICD-10-CM | POA: Diagnosis not present

## 2019-11-09 DIAGNOSIS — Z171 Estrogen receptor negative status [ER-]: Secondary | ICD-10-CM | POA: Insufficient documentation

## 2019-11-09 DIAGNOSIS — C773 Secondary and unspecified malignant neoplasm of axilla and upper limb lymph nodes: Secondary | ICD-10-CM | POA: Diagnosis not present

## 2019-11-09 DIAGNOSIS — Z95828 Presence of other vascular implants and grafts: Secondary | ICD-10-CM | POA: Insufficient documentation

## 2019-11-09 LAB — COMPREHENSIVE METABOLIC PANEL
ALT: 166 U/L — ABNORMAL HIGH (ref 0–44)
AST: 107 U/L — ABNORMAL HIGH (ref 15–41)
Albumin: 3.8 g/dL (ref 3.5–5.0)
Alkaline Phosphatase: 169 U/L — ABNORMAL HIGH (ref 38–126)
Anion gap: 12 (ref 5–15)
BUN: 9 mg/dL (ref 6–20)
CO2: 25 mmol/L (ref 22–32)
Calcium: 9.2 mg/dL (ref 8.9–10.3)
Chloride: 103 mmol/L (ref 98–111)
Creatinine, Ser: 0.82 mg/dL (ref 0.44–1.00)
GFR calc Af Amer: >60 mL/min (ref >60)
GFR calc non Af Amer: >60 mL/min (ref >60)
Glucose, Bld: 109 mg/dL — ABNORMAL HIGH (ref 70–99)
Potassium: 4.3 mmol/L (ref 3.5–5.1)
Sodium: 140 mmol/L (ref 135–145)
Total Bilirubin: 0.5 mg/dL (ref 0.3–1.2)
Total Protein: 7.4 g/dL (ref 6.5–8.1)

## 2019-11-09 LAB — CBC WITH DIFFERENTIAL/PLATELET
Abs Immature Granulocytes: 0.12 10*3/uL — ABNORMAL HIGH (ref 0.00–0.07)
Basophils Absolute: 0.1 10*3/uL (ref 0.0–0.1)
Basophils Relative: 0 %
Eosinophils Absolute: 0 10*3/uL (ref 0.0–0.5)
Eosinophils Relative: 0 %
HCT: 38.2 % (ref 36.0–46.0)
Hemoglobin: 12 g/dL (ref 12.0–15.0)
Immature Granulocytes: 1 %
Lymphocytes Relative: 17 %
Lymphs Abs: 2.2 10*3/uL (ref 0.7–4.0)
MCH: 28.2 pg (ref 26.0–34.0)
MCHC: 31.4 g/dL (ref 30.0–36.0)
MCV: 89.9 fL (ref 80.0–100.0)
Monocytes Absolute: 1 10*3/uL (ref 0.1–1.0)
Monocytes Relative: 8 %
Neutro Abs: 9.8 10*3/uL — ABNORMAL HIGH (ref 1.7–7.7)
Neutrophils Relative %: 74 %
Platelets: 324 10*3/uL (ref 150–400)
RBC: 4.25 MIL/uL (ref 3.87–5.11)
RDW: 12.6 % (ref 11.5–15.5)
WBC: 13.1 10*3/uL — ABNORMAL HIGH (ref 4.0–10.5)
nRBC: 0 % (ref 0.0–0.2)

## 2019-11-09 LAB — PREGNANCY, URINE: Preg Test, Ur: NEGATIVE

## 2019-11-09 MED ORDER — ACETAMINOPHEN 325 MG PO TABS
ORAL_TABLET | ORAL | Status: AC
  Filled 2019-11-09: qty 2

## 2019-11-09 MED ORDER — PALONOSETRON HCL INJECTION 0.25 MG/5ML
INTRAVENOUS | Status: AC
  Filled 2019-11-09: qty 5

## 2019-11-09 MED ORDER — SODIUM CHLORIDE 0.9% FLUSH
10.0000 mL | INTRAVENOUS | Status: DC | PRN
  Filled 2019-11-09: qty 10

## 2019-11-09 MED ORDER — PALONOSETRON HCL INJECTION 0.25 MG/5ML
0.2500 mg | Freq: Once | INTRAVENOUS | Status: AC
  Administered 2019-11-09: 0.25 mg via INTRAVENOUS

## 2019-11-09 MED ORDER — SODIUM CHLORIDE 0.9 % IV SOLN
150.0000 mg | Freq: Once | INTRAVENOUS | Status: AC
  Administered 2019-11-09: 150 mg via INTRAVENOUS
  Filled 2019-11-09: qty 150

## 2019-11-09 MED ORDER — SODIUM CHLORIDE 0.9 % IV SOLN
Freq: Once | INTRAVENOUS | Status: AC
  Filled 2019-11-09: qty 250

## 2019-11-09 MED ORDER — HEPARIN SOD (PORK) LOCK FLUSH 100 UNIT/ML IV SOLN
500.0000 [IU] | Freq: Once | INTRAVENOUS | Status: AC | PRN
  Administered 2019-11-09: 500 [IU]
  Filled 2019-11-09: qty 5

## 2019-11-09 MED ORDER — SODIUM CHLORIDE 0.9 % IV SOLN
750.0000 mg | Freq: Once | INTRAVENOUS | Status: AC
  Administered 2019-11-09: 750 mg via INTRAVENOUS
  Filled 2019-11-09: qty 75

## 2019-11-09 MED ORDER — SODIUM CHLORIDE 0.9% FLUSH
10.0000 mL | INTRAVENOUS | Status: DC | PRN
  Administered 2019-11-09: 10 mL via INTRAVENOUS
  Filled 2019-11-09: qty 10

## 2019-11-09 MED ORDER — SODIUM CHLORIDE 0.9 % IV SOLN
420.0000 mg | Freq: Once | INTRAVENOUS | Status: AC
  Administered 2019-11-09: 420 mg via INTRAVENOUS
  Filled 2019-11-09: qty 14

## 2019-11-09 MED ORDER — DIPHENHYDRAMINE HCL 25 MG PO CAPS
25.0000 mg | ORAL_CAPSULE | Freq: Once | ORAL | Status: AC
  Administered 2019-11-09: 25 mg via ORAL

## 2019-11-09 MED ORDER — ACETAMINOPHEN 325 MG PO TABS
650.0000 mg | ORAL_TABLET | Freq: Once | ORAL | Status: AC
  Administered 2019-11-09: 650 mg via ORAL

## 2019-11-09 MED ORDER — ALTEPLASE 2 MG IJ SOLR
2.0000 mg | Freq: Once | INTRAMUSCULAR | Status: AC | PRN
  Administered 2019-11-09: 2 mg
  Filled 2019-11-09: qty 2

## 2019-11-09 MED ORDER — SODIUM CHLORIDE 0.9 % IV SOLN
75.0000 mg/m2 | Freq: Once | INTRAVENOUS | Status: AC
  Administered 2019-11-09: 150 mg via INTRAVENOUS
  Filled 2019-11-09: qty 15

## 2019-11-09 MED ORDER — SODIUM CHLORIDE 0.9% FLUSH
10.0000 mL | INTRAVENOUS | Status: DC | PRN
  Administered 2019-11-09: 10 mL
  Filled 2019-11-09: qty 10

## 2019-11-09 MED ORDER — SODIUM CHLORIDE 0.9 % IV SOLN
10.0000 mg | Freq: Once | INTRAVENOUS | Status: AC
  Administered 2019-11-09: 10 mg via INTRAVENOUS
  Filled 2019-11-09: qty 10

## 2019-11-09 MED ORDER — DIPHENHYDRAMINE HCL 25 MG PO CAPS
ORAL_CAPSULE | ORAL | Status: AC
  Filled 2019-11-09: qty 1

## 2019-11-09 MED ORDER — TRASTUZUMAB-DKST CHEMO 150 MG IV SOLR
600.0000 mg | Freq: Once | INTRAVENOUS | Status: AC
  Administered 2019-11-09: 600 mg via INTRAVENOUS
  Filled 2019-11-09: qty 28.57

## 2019-11-09 NOTE — Progress Notes (Signed)
Nutrition Assessment  Reason for Assessment:  Pt identified by attending Breast Clinic  ASSESSMENT:  44 year old female with right breast cancer.  Patient receiving neoadjuvant chemotherapy.    Met with patient during infusion with interpreter Almyra Free present.  RD introduced self and service at [facility].  Patient reports good/normal appetite.  Typically has been eating 3 meals per day.    Medications:  reviewed  Labs: reviewed  Anthropometrics:   Height: 64 inches Weight: 204 lb, stable BMI: 35   NUTRITION DIAGNOSIS: Food and nutrition related knowledge deficit related to new diagnosis of breast cancer as evidenced by no prior need for nutrition related information.  INTERVENTION:   Provided spanish version of [employer]'s Nutrition for the Person with Cancer During Treatment booklet.   Encouraged foods high in protein.  Contact information provided and patient knows to contact me with questions/concerns.    MONITORING, EVALUATION, and GOAL: Pt will consume a healthy plant based diet to maintain lean body mass throughout treatment.   Shelia Magallon B. Zenia Resides, [place], [place] Registered Dietitian [phone] (pager)

## 2019-11-09 NOTE — Progress Notes (Signed)
Per Dr. Jana Hakim, okay for patient to receive treatment with today's lab work, including elevated AST and ALT.

## 2019-11-09 NOTE — Telephone Encounter (Signed)
Received breast MRI appt for GI on 11/12/19 arrive at 5:20. [place] interpreter with information to confirm with pt.

## 2019-11-09 NOTE — Patient Instructions (Signed)
[facility] Discharge Instructions for Patients Receiving Chemotherapy  Today you received the following chemotherapy agents Trastuzumab; Pertuzumab; Carboplatin; Docetaxel  To help prevent nausea and vomiting after your treatment, we encourage you to take your nausea medication as directed   If you develop nausea and vomiting that is not controlled by your nausea medication, call the clinic.   BELOW ARE SYMPTOMS THAT SHOULD BE REPORTED IMMEDIATELY:  *FEVER GREATER THAN 100.5 F  *CHILLS WITH OR WITHOUT FEVER  NAUSEA AND VOMITING THAT IS NOT CONTROLLED WITH YOUR NAUSEA MEDICATION  *UNUSUAL SHORTNESS OF BREATH  *UNUSUAL BRUISING OR BLEEDING  TENDERNESS IN MOUTH AND THROAT WITH OR WITHOUT PRESENCE OF ULCERS  *URINARY PROBLEMS  *BOWEL PROBLEMS  UNUSUAL RASH Items with * indicate a potential emergency and should be followed up as soon as possible.  Feel free to call the clinic should you have any questions or concerns. The clinic phone number is [phone].  Please show the [facility] at check-in to the Emergency Department and triage nurse.  Trastuzumab; Hyaluronidase injection Qu es este medicamento? TRASTUZUMAB; HIALURONIDASA se [place] para tratar el cncer de mama y el cncer de estmago. El trastuzumab es un anticuerpo monoclonal. La hialuronidasa se [place] para mejorar los efectos del trastuzumab. Este medicamento puede ser utilizado para otros usos; si tiene alguna pregunta consulte con su proveedor de atencin mdica o con su farmacutico. MARCAS COMUNES: HERCEPTIN HYLECTA Qu le debo informar a mi profesional de la salud antes de tomar este medicamento? Necesitan saber si usted presenta alguno de los siguientes problemas o situaciones: enfermedad cardiaca insuficiencia cardiaca enfermedad pulmonar o respiratoria, como asma una reaccin alrgica o inusual al trastuzumab, a otros medicamentos, alimentos, colorantes o conservantes si est embarazada o  buscando quedar embarazada si est amamantando a un beb Cmo debo utilizar este medicamento? Este medicamento se administra mediante una inyeccin por va subcutnea. Lo administra un profesional de Technical sales engineer en un hospital o en un entorno clnico. Hable con su pediatra para informarse acerca del uso de este medicamento en nios. Este medicamento no est aprobado para uso en nios. Sobredosis: Pngase en contacto inmediatamente con un centro toxicolgico o una sala de urgencia si usted cree que haya tomado demasiado medicamento. ATENCIN: [employer] es solo para usted. No comparta este medicamento con nadie. Qu sucede si me olvido de una dosis? Es importante no olvidar ninguna dosis. Informe a su mdico o a su profesional de la salud si no puede asistir a Photographer. Qu puede interactuar con este medicamento? Este medicamento podra interactuar con los siguientes frmacos: ciertos tipos de quimioterapia, tales como daunorubicina, [place], [place], e idarubicina Puede ser que esta lista no menciona todas las posibles interacciones. Informe a su profesional de KB Home	[place] de [employer] productos a base de hierbas, medicamentos de [place] o suplementos nutritivos que est tomando. Si usted fuma, consume bebidas alcohlicas o si utiliza drogas ilegales, indqueselo tambin a su profesional de KB Home	[place]. Algunas sustancias pueden interactuar con su medicamento. A qu debo estar atento al usar Coca-Cola? Visite a su mdico para que revise su evolucin. Si presenta algn efecto secundario, infrmelo. Contine con el tratamiento aun si se siente enfermo, a menos que su mdico le indique que lo suspenda. Consulte a su mdico o a su profesional de la salud si tiene fiebre, escalofros o dolor de garganta, o cualquier otro sntoma de resfro o gripe. No se trate usted mismo. Trate de no acercarse a Medical illustrator  enfermas. Es posible que tenga [place], escalofros y temblores durante  su primera infusin. Estos efectos generalmente son leves y pueden tratarse con otros medicamentos. Informe cualquier efecto secundario durante la infusin a su profesional de KB Home	[place]. La fiebre y los escalofros por lo general no suceden con las infusiones posteriores. No debe quedar embarazada mientras est usando este medicamento o por 7 meses despus de dejar de usarlo. Las mujeres deben informar a su mdico si estn buscando quedar embarazadas o si creen que podran estar embarazadas. Las mujeres con la posibilidad de Best boy nios deben tener una prueba de embarazo negativa antes de Art gallery manager a tomar este medicamento. Existe la posibilidad de efectos secundarios graves en un beb sin nacer. Para obtener ms informacin, hable con su profesional de la salud o su farmacutico. No debe amamantar a un beb mientras est tomando este medicamento o por 7 meses despus de dejar de usarlo. Qu efectos secundarios puedo tener al [employer] este medicamento? Efectos secundarios que debe informar a su mdico o a Barrister's clerk de la salud tan pronto como sea posible: Chief of Staff, como erupcin cutnea, comezn/picazn o urticaria, e hinchazn de la cara, los labios o la lengua problemas respiratorios dolor en el pecho o palpitaciones tos fiebre sensacin general de estar enfermo o sntomas gripales signos de empeoramiento de la insuficiencia cardiaca, tales como problemas respiratorios; hinchazn en las piernas y los pies Efectos secundarios que generalmente no requieren atencin mdica (debe informarlos a su mdico o a Barrister's clerk de la salud si persisten o si son molestos): dolor de Psychiatric nurse sentido del gusto diarrea dolor en las articulaciones nuseas, vmito cansancio o debilidad inusual prdida de peso Puede ser que esta lista no menciona todos los posibles efectos secundarios. Comunquese a su mdico por asesoramiento mdico [employer]. Usted puede informar los efectos  secundarios a la FDA por telfono al 1-800-FDA-1088. Dnde debo guardar mi medicina? Este medicamento se administra en hospitales o clnicas, y no necesitar guardarlo en su domicilio. ATENCIN: Este folleto es un resumen. Puede ser que no cubra toda la posible informacin. Si usted tiene preguntas acerca de esta medicina, consulte con su mdico, su farmacutico o su profesional de Technical sales engineer.  2020 Elsevier/Gold Standard (2018-04-06 00:00:00)  Pertuzumab injection Qu es este medicamento? El PERTUZUMAB es un anticuerpo monoclonal. Genene Churn para tratar el cncer de mama. Este medicamento puede ser utilizado para otros usos; si tiene alguna pregunta consulte con su proveedor de atencin mdica o con su farmacutico. MARCAS COMUNES: PERJETA Qu le debo informar a mi profesional de la salud antes de tomar este medicamento? Necesita saber si usted presenta alguno de los siguientes problemas o situaciones: enfermedad cardiaca insuficiencia cardiaca alta presin sangunea antecedentes de pulso cardiaca irregular radioterapia reciente o continuada una reaccin alrgica o inusual al pertuzumab, a otros medicamentos, alimentos, colorantes o conservantes si est embarazada o buscando quedar embarazada si est amamantando a un beb Cmo debo utilizar este medicamento? Este medicamento se administra mediante infusin por va intravenosa. Lo administra un profesional de Technical sales engineer en un hospital o en un entorno clnico. Hable con su pediatra para informarse acerca del uso de este medicamento en nios. Puede requerir atencin especial. Sobredosis: Pngase en contacto inmediatamente con un centro toxicolgico o una sala de urgencia si usted cree que haya tomado demasiado medicamento. ATENCIN: [employer] es solo para usted. No comparta este medicamento con nadie. Qu sucede si me olvido de una dosis? Es importante no olvidar ninguna dosis.  Informe a su mdico o a su profesional de la salud si no puede  asistir a Photographer. Qu puede interactuar con este medicamento? No se esperan interacciones. Puede ser que esta lista no menciona todas las posibles interacciones. Informe a su profesional de KB Home	[place] de [employer] productos a base de hierbas, medicamentos de [place] o suplementos nutritivos que est tomando. Si usted fuma, consume bebidas alcohlicas o si utiliza drogas ilegales, indqueselo tambin a su profesional de KB Home	[place]. Algunas sustancias pueden interactuar con su medicamento. A qu debo estar atento al usar Coca-Cola? Se supervisar su estado de salud atentamente mientras reciba este medicamento. Si presenta algn efecto secundario, infrmelo. Sin embargo, contine con el tratamiento aun si se siente enfermo, a menos que su mdico le indique que lo suspenda. No debe quedar embarazada mientras est tomando este medicamento o por 7 meses despus de dejar de usarlo. Las mujeres deben informar a su mdico si estn buscando quedar embarazadas o si creen que estn embarazadas. Las mujeres con la posibilidad de Best boy nios deben tener una prueba de embarazo negativa antes de Art gallery manager a tomar este medicamento. Existe la posibilidad de que ocurran efectos secundarios graves a un beb sin nacer. Para ms informacin hable con su profesional de la salud o su farmacutico. No debe amamantar a un beb mientras est tomando este medicamento o por 7 meses despus de dejar de usarlo. Las [employer] deben usar un mtodo anticonceptivo eficaz con este medicamento. Consulte a su mdico o a su profesional de la salud si tiene fiebre, escalofros, dolor de garganta, o cualquier otro sntoma de resfro o gripe. No se trate usted mismo. Trate de no acercarse a personas que estn enfermas. Es posible que tenga [place], escalofros y dolor de cabeza durante la infusin. Informe cualquier efecto secundario durante la infusin a su profesional de KB Home	[place]. Qu efectos secundarios puedo tener al [employer] este  medicamento? Efectos secundarios que debe informar a su mdico o a Barrister's clerk de la salud tan pronto como sea posible: Arboriculturist o palpitaciones mareos sensacin de Youth worker o aturdimiento fiebre o escalofros erupcin cutnea, picazn o urticaria dolor de garganta hinchazn de la cara, labios o lengua hinchazn de piernas o tobillos cansancio o debilidad inusual Efectos secundarios que generalmente no requieren atencin mdica (infrmelos a su mdico o a su profesional de la salud si persisten o si son molestos): diarrea cada del cabello nuseas, vmito cansancio Puede ser que esta lista no menciona todos los posibles efectos secundarios. Comunquese a su mdico por asesoramiento mdico [employer]. Usted puede informar los efectos secundarios a la FDA por telfono al 1-800-FDA-1088. Dnde debo guardar mi medicina? Este medicamento se administra en hospitales o clnicas y no necesitar guardarlo en su domicilio. ATENCIN: Este folleto es un resumen. Puede ser que no cubra toda la posible informacin. Si usted tiene preguntas acerca de esta medicina, consulte con su mdico, su farmacutico o su profesional de Technical sales engineer.  2020 Elsevier/Gold Standard (2016-06-27 00:00:00)  Carboplatin injection Qu es este medicamento? El CARBOPLATINO es un agente quimioteraputico. Este medicamento acta sobre las clulas que se dividen rpidamente, como las clulas cancergenas, y finalmente provoca la muerte de estas clulas. Se utiliza en el tratamiento del cncer de ovario y muchos otros tipos de Hotel manager. Este medicamento puede ser utilizado para otros usos; si tiene alguna pregunta consulte con su proveedor de atencin mdica o con su farmacutico. MARCAS COMUNES: Paraplatin Qu le debo informar  a mi profesional de la salud antes de tomar este medicamento? Necesita saber si usted presenta alguno de los siguientes problemas o situaciones:  trastornos  sanguneos  problemas auditivos  enfermedad renal  radioterapia reciente o continuada  una reaccin alrgica o inusual al carboplatino, al cisplatino, a otros agentes quimioteraputicos, a otros medicamentos, alimentos, colorantes o conservantes  si est embarazada o buscando quedar embarazada  si est amamantando a un beb Cmo debo [employer]? Este medicamento se administra normalmente mediante infusin por va intravenosa. Lo administra un profesional de la salud calificado en un hospital o en un entorno clnico. Hable con su pediatra para informarse acerca del uso de este medicamento en nios. Puede requerir atencin especial. Sobredosis: Pngase en contacto inmediatamente con un centro toxicolgico o una sala de urgencia si usted cree que haya tomado demasiado medicamento. ATENCIN: [employer] es solo para usted. No comparta este medicamento con nadie. Qu sucede si me olvido de una dosis? Es importante no olvidar ninguna dosis. Informe a su mdico o a su profesional de la salud si no puede asistir a Photographer. Qu puede interactuar con este medicamento?  medicamentos para convulsiones  medicamentos para incrementar los conteos sanguneos, tales como filgrastim, pegfilgrastim, sargramostim  ciertos antibiticos, tales como amicacina, gentamicina, neomicina, estreptomicina, tobramicina  vacunas Consulte a su mdico o a su profesional de la salud antes de tomar cualquiera de los siguientes medicamentos:  acetaminofeno  aspirina  ibuprofeno  quetoprofeno  naproxeno Puede ser que esta lista no menciona todas las posibles interacciones. Informe a su profesional de KB Home	[place] de [employer] productos a base de hierbas, medicamentos de [place] o suplementos nutritivos que est tomando. Si usted fuma, consume bebidas alcohlicas o si utiliza drogas ilegales, indqueselo tambin a su profesional de KB Home	[place]. Algunas sustancias pueden interactuar con su  medicamento. A qu debo estar atento al usar Coca-Cola? Se supervisar su estado de salud atentamente mientras reciba este medicamento. Tendr que hacerse anlisis de sangre peridicos mientras est tomando este medicamento. Este medicamento puede hacerle sentir un Nurse, mental health. Esto es normal ya que la quimioterapia afecta tanto a las clulas sanas como a las clulas cancerosas. Si presenta alguno de los [employer], infrmelos. Sin embargo, contine con el tratamiento aun si se siente enfermo, a menos que su mdico le indique que lo suspenda. En algunos casos, podr recibir [employer] para ayudarle con los efectos secundarios. Siga las instrucciones para usarlos. Consulte a su mdico o a su profesional de la salud por asesoramiento si tiene fiebre, escalofros, dolor de garganta o cualquier otro sntoma de resfro o gripe. No se trate usted mismo. Este medicamento puede reducir la capacidad del cuerpo para combatir infecciones. Trate de no acercarse a personas que estn enfermas. [employer] puede aumentar el riesgo de magulladuras o sangrado. Consulte a su mdico o a su profesional de la salud si observa sangrados inusuales. Proceda con cuidado al cepillar sus dientes, usar hilo dental o Risk manager palillos para los dientes, ya que puede contraer una infeccin o Therapist, art con mayor facilidad. Si se somete a algn tratamiento dental, informe a su dentista que est [employer]. Evite tomar productos que contienen aspirina, acetaminofeno, ibuprofeno, naproxeno o quetoprofeno a menos que as lo indique su mdico. Estos productos pueden disimular la fiebre. No se debe quedar embarazada mientras recibe este medicamento. Las mujeres deben informar a su mdico si estn buscando quedar embarazadas o si creen que estn embarazadas. Existe la posibilidad de efectos secundarios  graves a un beb sin nacer. Para ms informacin hable con su profesional de la salud o su  farmacutico. No debe Economist a un beb mientras est usando este medicamento. Qu efectos secundarios puedo tener al [employer] este medicamento? Efectos secundarios que debe informar a su mdico o a Barrister's clerk de la salud tan pronto como sea posible:  Chief of Staff como erupcin cutnea, picazn o urticarias, hinchazn de la cara, labios o lengua  signos de infeccin - fiebre o escalofros, tos, dolor de garganta, dolor o dificultad para orinar  signos de reduccin de plaquetas o sangrado - magulladuras, puntos rojos en la piel, heces de color oscuro o con aspecto alquitranado, sangrando por la nariz  signos de reduccin de glbulos rojos - cansancio o debilidad inusual, desmayos, sensacin de [employer]  problemas respiratorios  cambios de audicin  cambios en la visin  dolor en el pecho  alta presin sangunea  conteos sanguneos bajos - Este medicamento puede reducir la cantidad de glbulos blancos, glbulos rojos y plaquetas. Su riesgo de infeccin y [place].  nuseas, vmito  dolor, enrojecimiento, hinchazn o irritacin en el lugar de la inyeccin  dolor, hormigueo, entumecimiento de manos o pies  problemas de coordinacin, del habla, al caminar  dificultad para orinar o cambios en el volumen de orina Efectos secundarios que, por lo general, no requieren atencin mdica (debe informarlos a su mdico o a su profesional de la salud si persisten o si son molestos):  cada del cabello  prdida del apetito  sabor metlico o cambios en el sentido del gusto Puede ser que esta lista no menciona todos los posibles efectos secundarios. Comunquese a su mdico por asesoramiento mdico [employer]. Usted puede informar los efectos secundarios a la FDA por telfono al 1-800-FDA-1088. Dnde debo guardar mi medicina? Este medicamento se administra en hospitales o clnicas y no necesitar guardarlo en su domicilio. ATENCIN: Este folleto  es un resumen. Puede ser que no cubra toda la posible informacin. Si usted tiene preguntas acerca de esta medicina, consulte con su mdico, su farmacutico o su profesional de Technical sales engineer.  2020 Elsevier/Gold Standard (2014-07-19 00:00:00)  Docetaxel injection Qu es este medicamento? El DOCETAXEL es un agente quimioteraputico. Este medicamento acta sobre las clulas que se dividen rpidamente, como las clulas cancergenas, y finalmente provoca la muerte de estas clulas. Se utiliza en el tratamiento de muchos tipos de cncer como el cncer de mama, adenocarcinoma gstrico, cabeza y cuello, pulmn y prstata. Este medicamento puede ser utilizado para otros usos; si tiene alguna pregunta consulte con su proveedor de atencin mdica o con su farmacutico. MARCAS COMUNES: Docefrez, Taxotere Qu le debo informar a mi profesional de la salud antes de tomar este medicamento? Necesita saber si usted presenta alguno de los siguientes problemas o situaciones:  infeccin (especialmente infecciones virales, como varicela o herpes)  enfermedad heptica  conteos sanguneos bajos, como baja cantidad de glbulos blancos, glbulos rojos y plaquetas  una reaccin alrgica o inusual al docetaxel, polisorbato 80, a otros agentes quimioteraputicos, a otros medicamentos, alimentos, colorantes o conservantes  si est embarazada o buscando quedar embarazada  si est amamantando a un beb Cmo debo utilizar este medicamento? Este medicamento se administra como infusin en una vena. Un profesional de la salud especialmente capacitado lo administra en un hospital o clnica. Hable con su pediatra para informarse acerca del uso de este medicamento en nios. Puede requerir atencin especial. Sobredosis: Pngase en contacto inmediatamente con un centro toxicolgico o  una sala de urgencia si usted cree que haya tomado demasiado medicamento. ATENCIN: [employer] es solo para usted. No comparta este medicamento  con nadie. Qu sucede si me olvido de una dosis? Es importante no olvidar ninguna dosis. Informe a su mdico o a su profesional de la salud si no puede asistir a Photographer. Qu puede interactuar con este medicamento? aprepitant ciertos antibiticos, tales como eritromicina o claritromicina ciertos medicamentos antivirales para VIH o hepatitis ciertos medicamentos para infecciones micticas, tales como fluconazol, itraconazol, ketoconazol, posaconazol o voriconazol cimetidina ciprofloxacino conivaptn ciclosporina dronedarona fluvoxamina jugo de toronja (pomelo) imatinib verapamilo Puede ser que esta lista no menciona todas las posibles interacciones. Informe a su profesional de KB Home	[place] de [employer] productos a base de hierbas, medicamentos de [place] o suplementos nutritivos que est tomando. Si usted fuma, consume bebidas alcohlicas o si utiliza drogas ilegales, indqueselo tambin a su profesional de KB Home	[place]. Algunas sustancias pueden interactuar con su medicamento. A qu debo estar atento al usar Coca-Cola? Se supervisar su estado de salud atentamente mientras reciba este medicamento. Tendr que hacerse anlisis de sangre importantes mientras est usando este medicamento. Llame a su mdico o a su profesional de la salud si tiene fiebre, escalofros o dolor de garganta, o cualquier otro sntoma de resfro o gripe. No se trate usted mismo. Este medicamento reduce la capacidad del cuerpo para combatir infecciones. Trate de no acercarse a personas que estn enfermas. Algunos productos pueden contener alcohol. Pregunte a su profesional de la salud si este medicamento contiene alcohol. Asegrese de informar a todos los profesionales de la salud que usted est usando [place]. Ciertos medicamentos, como metronidazol y disulfirm, pueden causar una reaccin desagradable cuando se usan con alcohol. Esta reaccin incluye enrojecimiento, dolor de cabeza, nuseas, vmitos, sudoracin y  aumento de la sed. La reaccin puede durar de 30 minutos a varias horas. Puede experimentar somnolencia o mareos. No conduzca, no utilice maquinaria ni haga nada que Associate Professor en estado de alerta hasta que sepa cmo le afecta este medicamento. No se siente ni se ponga de pie con rapidez, especialmente si es un paciente de edad avanzada. Esto reduce el riesgo de mareos o [employer]. El alcohol puede interferir con el efecto de este medicamento. Hable con su profesional de la salud sobre su riesgo de Hotel manager. Usted puede tener mayor riesgo para ciertos tipos de cncer si [place] este medicamento. No debe quedar embarazada mientras est usando este medicamento o por 6 meses despus de dejar de usarlo. Las mujeres deben informar a su mdico si estn buscando quedar embarazadas o si creen que podran estar embarazadas. Existe la posibilidad deEfectos secundarios graves en un beb sin nacer. Para obtener ms informacin, hable con su profesional de la salud o su farmacutico. No debe Economist a un beb mientras est usando este medicamento o por 1 semana despus de dejar de usarlo. Los hombres que reciben este medicamento deben usar un condn durante las relaciones sexuales con mujeres que puedan quedar embarazadas. Si usted embaraza a [employer], el beb podra tener defectos de nacimiento. El beb podra morir antes de Associate Professor. Deber seguir usando condn durante 3 meses despus de suspender el medicamento. Informe a su proveedor de Geophysical data processor de inmediato si su pareja queda embarazada mientras usted est [employer]. Esto puede interferir con la capacidad de los hombres para Social worker a Musician. Usted debe hablar con su mdico o su profesional de la salud si est preocupado por su  fertilidad. Qu efectos secundarios puedo tener al [employer] este medicamento? Efectos secundarios que debe informar a su mdico o a Barrister's clerk de la salud tan pronto como sea posible: Chief of Staff,  como erupcin cutnea, comezn/picazn o urticaria, e hinchazn de la cara, los labios o la lengua visin borrosa problemas para respirar cambios en la visin conteos sanguneos bajos: este frmaco podra reducir la cantidad de glbulos blancos, glbulos rojos y plaquetas. Su riesgo de infeccin y sangrado podra ser mayor. nuseas y Proofreader, enrojecimiento o Actor de Air cabin crew, hormigueo o entumecimiento de las manos o los pies enrojecimiento, formacin de Nurse, children's, descamacin o distensin de la piel, incluso dentro de la boca signos de disminucin en la cantidad de plaquetas o sangrado: moretones, puntos rojos en la piel, heces de color negro y aspecto alquitranado, sangrado por la nariz signos de disminucin en la cantidad de glbulos rojos: cansancio o debilidad inusual, Youth worker, aturdimiento signos de infeccin: fiebre o escalofros, tos, Social research officer, government de garganta, Social research officer, government o dificultad para orinar hinchazn de tobillos, pies, manos Efectos secundarios que generalmente no requieren atencin mdica (infrmelos a su mdico o a Barrister's clerk de la salud si persisten o si son molestos): estreimiento diarrea cambios en las uas de las manos o de los pies cada del cabello prdida del apetito llagas en la boca Marketing executive Puede ser que esta lista no menciona todos los posibles efectos secundarios. Comunquese a su mdico por asesoramiento mdico [employer] Usted puede informar los efectos secundarios a la FDA por telfono al 1-800-FDA-1088. Dnde debo guardar mi medicina? Este medicamento se administra en hospitales o clnicas y no necesitar guardarlo en su domicilio. ATENCIN: Este folleto es un resumen. Puede ser que no cubra toda la posible informacin. Si usted tiene preguntas acerca de esta medicina, consulte con su mdico, su farmacutico o su profesional de Technical sales engineer.  2020 Elsevier/Gold Standard (2019-03-31 00:00:00)

## 2019-11-09 NOTE — Progress Notes (Signed)
Ok to tx with elevated AST/ALT per Dr Jana Hakim

## 2019-11-10 ENCOUNTER — Telehealth: Payer: Self-pay | Admitting: Oncology

## 2019-11-10 NOTE — Telephone Encounter (Signed)
Changed provider for already scheduled appts per 6/1 los.

## 2019-11-11 ENCOUNTER — Inpatient Hospital Stay: Payer: Medicaid Other

## 2019-11-11 ENCOUNTER — Other Ambulatory Visit: Payer: Self-pay

## 2019-11-11 VITALS — BP 144/94 | HR 90 | Temp 98.6°F | Resp 18

## 2019-11-11 DIAGNOSIS — Z5111 Encounter for antineoplastic chemotherapy: Secondary | ICD-10-CM | POA: Diagnosis not present

## 2019-11-11 DIAGNOSIS — C50411 Malignant neoplasm of upper-outer quadrant of right female breast: Secondary | ICD-10-CM

## 2019-11-11 DIAGNOSIS — Z5112 Encounter for antineoplastic immunotherapy: Secondary | ICD-10-CM | POA: Diagnosis not present

## 2019-11-11 DIAGNOSIS — Z5189 Encounter for other specified aftercare: Secondary | ICD-10-CM | POA: Diagnosis not present

## 2019-11-11 DIAGNOSIS — Z171 Estrogen receptor negative status [ER-]: Secondary | ICD-10-CM | POA: Diagnosis not present

## 2019-11-11 DIAGNOSIS — C773 Secondary and unspecified malignant neoplasm of axilla and upper limb lymph nodes: Secondary | ICD-10-CM | POA: Diagnosis not present

## 2019-11-11 MED ORDER — PEGFILGRASTIM-JMDB 6 MG/0.6ML ~~LOC~~ SOSY
PREFILLED_SYRINGE | SUBCUTANEOUS | Status: AC
  Filled 2019-11-11: qty 0.6

## 2019-11-11 MED ORDER — PEGFILGRASTIM-JMDB 6 MG/0.6ML ~~LOC~~ SOSY
6.0000 mg | PREFILLED_SYRINGE | Freq: Once | SUBCUTANEOUS | Status: AC
  Administered 2019-11-11: 6 mg via SUBCUTANEOUS

## 2019-11-11 NOTE — Patient Instructions (Signed)

## 2019-11-12 ENCOUNTER — Ambulatory Visit
Admission: RE | Admit: 2019-11-12 | Discharge: 2019-11-12 | Disposition: A | Discharge status: Home / Self Care | Payer: Medicaid Other | Source: Ambulatory Visit | Attending: Oncology | Admitting: Oncology

## 2019-11-12 DIAGNOSIS — Z170 Estrogen receptor positive status [ER+]: Secondary | ICD-10-CM

## 2019-11-12 DIAGNOSIS — C50911 Malignant neoplasm of unspecified site of right female breast: Secondary | ICD-10-CM | POA: Diagnosis not present

## 2019-11-12 MED ORDER — GADOBUTROL 1 MMOL/ML IV SOLN
10.0000 mL | Freq: Once | INTRAVENOUS | Status: AC | PRN
  Administered 2019-11-12: 10 mL via INTRAVENOUS

## 2019-11-15 ENCOUNTER — Other Ambulatory Visit: Payer: Self-pay | Admitting: Oncology

## 2019-11-15 ENCOUNTER — Encounter: Payer: Self-pay | Admitting: *Deleted

## 2019-11-18 ENCOUNTER — Encounter: Payer: Self-pay | Admitting: *Deleted

## 2019-11-30 ENCOUNTER — Inpatient Hospital Stay: Payer: Medicaid Other

## 2019-11-30 ENCOUNTER — Encounter: Payer: Self-pay | Admitting: Adult Health

## 2019-11-30 ENCOUNTER — Other Ambulatory Visit: Payer: Self-pay | Admitting: *Deleted

## 2019-11-30 ENCOUNTER — Encounter: Payer: Self-pay | Admitting: *Deleted

## 2019-11-30 ENCOUNTER — Inpatient Hospital Stay (HOSPITAL_BASED_OUTPATIENT_CLINIC_OR_DEPARTMENT_OTHER): Payer: Medicaid Other | Admitting: Adult Health

## 2019-11-30 ENCOUNTER — Other Ambulatory Visit: Payer: Self-pay

## 2019-11-30 VITALS — BP 124/85 | HR 95 | Temp 98.3°F | Resp 20 | Ht 64.0 in | Wt 205.2 lb

## 2019-11-30 VITALS — BP 110/75 | HR 87 | Temp 97.7°F | Resp 18

## 2019-11-30 DIAGNOSIS — C773 Secondary and unspecified malignant neoplasm of axilla and upper limb lymph nodes: Secondary | ICD-10-CM | POA: Diagnosis not present

## 2019-11-30 DIAGNOSIS — Z170 Estrogen receptor positive status [ER+]: Secondary | ICD-10-CM | POA: Diagnosis not present

## 2019-11-30 DIAGNOSIS — C50411 Malignant neoplasm of upper-outer quadrant of right female breast: Secondary | ICD-10-CM

## 2019-11-30 DIAGNOSIS — Z5189 Encounter for other specified aftercare: Secondary | ICD-10-CM | POA: Diagnosis not present

## 2019-11-30 DIAGNOSIS — Z5111 Encounter for antineoplastic chemotherapy: Secondary | ICD-10-CM | POA: Diagnosis not present

## 2019-11-30 DIAGNOSIS — Z95828 Presence of other vascular implants and grafts: Secondary | ICD-10-CM

## 2019-11-30 DIAGNOSIS — Z5112 Encounter for antineoplastic immunotherapy: Secondary | ICD-10-CM | POA: Diagnosis not present

## 2019-11-30 DIAGNOSIS — Z171 Estrogen receptor negative status [ER-]: Secondary | ICD-10-CM | POA: Diagnosis not present

## 2019-11-30 LAB — CBC WITH DIFFERENTIAL/PLATELET
Abs Immature Granulocytes: 0.03 10*3/uL (ref 0.00–0.07)
Basophils Absolute: 0 10*3/uL (ref 0.0–0.1)
Basophils Relative: 0 %
Eosinophils Absolute: 0 10*3/uL (ref 0.0–0.5)
Eosinophils Relative: 0 %
HCT: 32.3 % — ABNORMAL LOW (ref 36.0–46.0)
Hemoglobin: 10.2 g/dL — ABNORMAL LOW (ref 12.0–15.0)
Immature Granulocytes: 0 %
Lymphocytes Relative: 34 %
Lymphs Abs: 2.6 10*3/uL (ref 0.7–4.0)
MCH: 27.9 pg (ref 26.0–34.0)
MCHC: 31.6 g/dL (ref 30.0–36.0)
MCV: 88.3 fL (ref 80.0–100.0)
Monocytes Absolute: 0.5 10*3/uL (ref 0.1–1.0)
Monocytes Relative: 7 %
Neutro Abs: 4.4 10*3/uL (ref 1.7–7.7)
Neutrophils Relative %: 59 %
Platelets: 174 10*3/uL (ref 150–400)
RBC: 3.66 MIL/uL — ABNORMAL LOW (ref 3.87–5.11)
RDW: 13.8 % (ref 11.5–15.5)
WBC: 7.6 10*3/uL (ref 4.0–10.5)
nRBC: 0 % (ref 0.0–0.2)

## 2019-11-30 LAB — COMPREHENSIVE METABOLIC PANEL
ALT: 69 U/L — ABNORMAL HIGH (ref 0–44)
AST: 47 U/L — ABNORMAL HIGH (ref 15–41)
Albumin: 3.7 g/dL (ref 3.5–5.0)
Alkaline Phosphatase: 182 U/L — ABNORMAL HIGH (ref 38–126)
Anion gap: 11 (ref 5–15)
BUN: 11 mg/dL (ref 6–20)
CO2: 25 mmol/L (ref 22–32)
Calcium: 8.8 mg/dL — ABNORMAL LOW (ref 8.9–10.3)
Chloride: 104 mmol/L (ref 98–111)
Creatinine, Ser: 0.83 mg/dL (ref 0.44–1.00)
GFR calc Af Amer: >60 mL/min (ref >60)
GFR calc non Af Amer: >60 mL/min (ref >60)
Glucose, Bld: 108 mg/dL — ABNORMAL HIGH (ref 70–99)
Potassium: 3.6 mmol/L (ref 3.5–5.1)
Sodium: 140 mmol/L (ref 135–145)
Total Bilirubin: 0.6 mg/dL (ref 0.3–1.2)
Total Protein: 7 g/dL (ref 6.5–8.1)

## 2019-11-30 LAB — PREGNANCY, URINE: Preg Test, Ur: NEGATIVE

## 2019-11-30 MED ORDER — PALONOSETRON HCL INJECTION 0.25 MG/5ML
0.2500 mg | Freq: Once | INTRAVENOUS | Status: AC
  Administered 2019-11-30: 0.25 mg via INTRAVENOUS

## 2019-11-30 MED ORDER — DIPHENHYDRAMINE HCL 25 MG PO CAPS
25.0000 mg | ORAL_CAPSULE | Freq: Once | ORAL | Status: AC
  Administered 2019-11-30: 25 mg via ORAL

## 2019-11-30 MED ORDER — SODIUM CHLORIDE 0.9% FLUSH
10.0000 mL | INTRAVENOUS | Status: DC | PRN
  Administered 2019-11-30: 10 mL
  Filled 2019-11-30: qty 10

## 2019-11-30 MED ORDER — HEPARIN SOD (PORK) LOCK FLUSH 100 UNIT/ML IV SOLN
500.0000 [IU] | Freq: Once | INTRAVENOUS | Status: AC | PRN
  Administered 2019-11-30: 500 [IU]
  Filled 2019-11-30: qty 5

## 2019-11-30 MED ORDER — ALTEPLASE 2 MG IJ SOLR
INTRAMUSCULAR | Status: AC
  Filled 2019-11-30: qty 2

## 2019-11-30 MED ORDER — LORATADINE 10 MG PO TABS: 10.0000 mg | ORAL_TABLET | Freq: Every day | ORAL | 1 refills | Status: DC

## 2019-11-30 MED ORDER — TRASTUZUMAB-DKST CHEMO 150 MG IV SOLR
600.0000 mg | Freq: Once | INTRAVENOUS | Status: AC
  Administered 2019-11-30: 600 mg via INTRAVENOUS
  Filled 2019-11-30: qty 28.57

## 2019-11-30 MED ORDER — SODIUM CHLORIDE 0.9 % IV SOLN
750.0000 mg | Freq: Once | INTRAVENOUS | Status: AC
  Administered 2019-11-30: 750 mg via INTRAVENOUS
  Filled 2019-11-30: qty 75

## 2019-11-30 MED ORDER — ACETAMINOPHEN 325 MG PO TABS
ORAL_TABLET | ORAL | Status: AC
  Filled 2019-11-30: qty 2

## 2019-11-30 MED ORDER — DIPHENHYDRAMINE HCL 25 MG PO CAPS
ORAL_CAPSULE | ORAL | Status: AC
  Filled 2019-11-30: qty 1

## 2019-11-30 MED ORDER — PALONOSETRON HCL INJECTION 0.25 MG/5ML
INTRAVENOUS | Status: AC
  Filled 2019-11-30: qty 5

## 2019-11-30 MED ORDER — SODIUM CHLORIDE 0.9 % IV SOLN
75.0000 mg/m2 | Freq: Once | INTRAVENOUS | Status: AC
  Administered 2019-11-30: 150 mg via INTRAVENOUS
  Filled 2019-11-30: qty 15

## 2019-11-30 MED ORDER — ALTEPLASE 2 MG IJ SOLR
2.0000 mg | Freq: Once | INTRAMUSCULAR | Status: AC | PRN
  Administered 2019-11-30: 2 mg
  Filled 2019-11-30: qty 2

## 2019-11-30 MED ORDER — SODIUM CHLORIDE 0.9 % IV SOLN
10.0000 mg | Freq: Once | INTRAVENOUS | Status: AC
  Administered 2019-11-30: 10 mg via INTRAVENOUS
  Filled 2019-11-30: qty 10

## 2019-11-30 MED ORDER — SODIUM CHLORIDE 0.9 % IV SOLN
Freq: Once | INTRAVENOUS | Status: AC
  Filled 2019-11-30: qty 250

## 2019-11-30 MED ORDER — SODIUM CHLORIDE 0.9 % IV SOLN
420.0000 mg | Freq: Once | INTRAVENOUS | Status: AC
  Administered 2019-11-30: 420 mg via INTRAVENOUS
  Filled 2019-11-30: qty 14

## 2019-11-30 MED ORDER — ACETAMINOPHEN 325 MG PO TABS
650.0000 mg | ORAL_TABLET | Freq: Once | ORAL | Status: AC
  Administered 2019-11-30: 650 mg via ORAL

## 2019-11-30 MED ORDER — SODIUM CHLORIDE 0.9 % IV SOLN
150.0000 mg | Freq: Once | INTRAVENOUS | Status: AC
  Administered 2019-11-30: 150 mg via INTRAVENOUS
  Filled 2019-11-30: qty 150

## 2019-11-30 NOTE — Progress Notes (Signed)
[name]  [phone] [phone]     ID: Alice [name] DOB: [birth year]  MR#: [id number]  WLS#:[id number]  Patient Care Team: Drue Flirt, MD as PCP - General (Family Medicine) Mauro Kaufmann, RN as Oncology Nurse Navigator Rockwell Germany, RN as Oncology Nurse Navigator Alphonsa Overall, MD as Consulting Physician (General Surgery) Magrinat, Virgie Dad, MD as Consulting Physician (Oncology) Kyung Rudd, MD as Consulting Physician (Radiation Oncology) Scot Dock, NP OTHER MD:  CHIEF COMPLAINT: estrogen receptor negative, Her2 positive breast cancer  CURRENT TREATMENT: Neoadjuvant chemotherapy   INTERVAL HISTORY: Alice [name] returns today for follow up and treatment of her estrogen receptor negative, Her2 positive breast cancer, accompanied by a Spanish interpreter Theresia Bough.   She continues on neoadjuvant chemotherapy. Today is day 1 cycle 3.   She notes her breast is improving dramatically.    REVIEW OF SYSTEMS: Symphany is doing well today.  She has no peripheral neuropathy and says she is feeling well.  She is walking 15 minutes per day.  She denies fatigue.    Jeymi is staying at home with her mother, and notes during the day she does housework.    Ravon has mild diarrhea, and has about 3 episodes per day.  She says she manages this with imodium, and it isn't particularly bothersome.  She notes that loose bms happen in the morning, and firm up in the the afternoon.  She is confused about how to take her Dexamethasone and wants some clarification.    She denies any fever, chills, chest pain, cough, palpitations, shortness of breath, or any other issues.  A detailed ROS was otherwise non contributory.     HISTORY OF CURRENT ILLNESS: From the original intake note:  Alice [name] presented with right breast pain with swelling and skin changes (duskiness and dimpling) and left breast pain at 6 o'clock. She underwent bilateral  diagnostic mammography with tomography and bilateral breast ultrasonography at [facility] on 09/23/2019 showing: breast density category C; 5.2 cm irregular mass in right breast spanning 10-12 o'clock, with marked peau d'orange changes concerning for inflammatory component; 3.3 cm enlarged lymph node in right axilla; indeterminate 1 cm oval mass in left breast at 5 o'clock.  Accordingly on 09/30/2019 she proceeded to biopsy of the right breast area in question. The pathology from this procedure ([id number]) showed: invasive mammary carcinoma, grade 3, e-cadherin positive. Prognostic indicators significant for: estrogen receptor, 0% negative and progesterone receptor, 0% negative. Proliferation marker Ki67 at 80%. HER2 positive by immunohistochemistry (3+).  The biopsied right axillary lymph node was positive for metastatic carcinoma.  She also underwent cyst aspiration of the 1 cm mass in the left breast the same day.  The patient's subsequent history is as detailed below.   PAST MEDICAL HISTORY: Past Medical History:  Diagnosis Date  . Hypertension     PAST SURGICAL HISTORY: Past Surgical History:  Procedure Laterality Date  . CHOLECYSTECTOMY    . IR IMAGING GUIDED PORT INSERTION  10/13/2019    FAMILY HISTORY: History reviewed. No pertinent family history.  As of 09/2019-- her father is 9 and her mother is 41. She has two brothers and one sister. There is no cancer in her family to her knowledge.    GYNECOLOGIC HISTORY:  No LMP recorded. Menarche: 44 years old Age at first live birth: 44 years old GX P 2 LMP regular periods lasting 3 days Contraceptive: previously used, has not used for the last 2 years HRT n/a  Hysterectomy? no BSO? no   SOCIAL HISTORY: (updated 09/2019)  Alice [name] is originally [place]  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 Husband Alice [name] is currently unemployed but is a good Training and development officer. She lives at home with Alice [name] and their  two children. Son Alice [name], age 50, has special needs.  Son Alice [name], age 64, is in high school. She attends     ADVANCED DIRECTIVES: In the absence of any documentation to the contrary, the patient's spouse is their HCPOA.    HEALTH MAINTENANCE: Social History   Tobacco Use  . Smoking status: Never Smoker  . Smokeless tobacco: Never Used  Substance Use Topics  . Alcohol use: Never  . Drug use: Never     Colonoscopy: n/a (age)  PAP: 2020  Bone density: n/a (age)   No Known Allergies  Current Outpatient Medications  Medication Sig Dispense Refill  . dexamethasone (DECADRON) 4 MG tablet Take 2 tablets (8 mg total) by mouth 2 (two) times daily. Start the day before Taxotere. Take once the day after, then 2 times a day x 2d. 30 tablet 1  . doxycycline (VIBRA-TABS) 100 MG tablet Take 1 tablet (100 mg total) by mouth daily. 60 tablet 1  . ibuprofen (ADVIL,MOTRIN) 200 MG tablet Take 400 mg by mouth every 6 (six) hours as needed for fever, headache or cramping.     . lidocaine-prilocaine (EMLA) cream Apply to affected area once 30 g 3  . lisinopril (ZESTRIL) 10 MG tablet Take 10 mg by mouth daily.    Marland Kitchen loratadine (CLARITIN) 10 MG tablet Take 1 tablet (10 mg total) by mouth daily. 60 tablet 1  . LORazepam (ATIVAN) 0.5 MG tablet Take 1 tablet (0.5 mg total) by mouth at bedtime as needed (Nausea or vomiting). (Patient not taking: Reported on 11/30/2019) 20 tablet 0  . prochlorperazine (COMPAZINE) 10 MG tablet Take 1 tablet (10 mg total) by mouth every 6 (six) hours as needed (Nausea or vomiting). (Patient not taking: Reported on 11/30/2019) 30 tablet 1   No current facility-administered medications for this visit.    OBJECTIVE:   Vitals:   11/30/19 0908  BP: 124/85  Pulse: 95  Resp: 20  Temp: 98.3 F (36.8 C)  SpO2: 100%     Body mass index is 35.22 kg/m.   Wt Readings from Last 3 Encounters:  11/30/19 205 lb 3.2 oz (93.1 kg)  11/09/19 204 lb 4.8 oz (92.7 kg)  10/27/19 201 lb 4.8 oz  (91.3 kg)      ECOG FS:1 - Symptomatic but completely ambulatory  GENERAL: Patient is a well appearing female in no acute distress HEENT:  Sclerae anicteric.  Mask in place. Neck is supple.  NODES:  No cervical, supraclavicular, or axillary lymphadenopathy palpated.  BREAST EXAM:  Right breast mass is softer, and no sign of progression noted. LUNGS:  Clear to auscultation bilaterally.  No wheezes or rhonchi. HEART:  Regular rate and rhythm. No murmur appreciated. ABDOMEN:  Soft, nontender.  Positive, normoactive bowel sounds. No organomegaly palpated. MSK:  No focal spinal tenderness to palpation. Full range of motion bilaterally in the upper extremities. EXTREMITIES:  No peripheral edema.   SKIN:  Clear with no obvious rashes or skin changes. No nail dyscrasia. NEURO:  Nonfocal. Well oriented.  Appropriate affect.     Right breast 10/06/2019     LAB RESULTS:  CMP     Component Value Date/Time   NA 140 11/30/2019 0818   K  3.6 11/30/2019 0818   CL 104 11/30/2019 0818   CO2 25 11/30/2019 0818   GLUCOSE 108 (H) 11/30/2019 0818   BUN 11 11/30/2019 0818   CREATININE 0.83 11/30/2019 0818   CREATININE 0.79 10/06/2019 0826   CALCIUM 8.8 (L) 11/30/2019 0818   PROT 7.0 11/30/2019 0818   ALBUMIN 3.7 11/30/2019 0818   AST 47 (H) 11/30/2019 0818   AST 26 10/06/2019 0826   ALT 69 (H) 11/30/2019 0818   ALT 47 (H) 10/06/2019 0826   ALKPHOS 182 (H) 11/30/2019 0818   BILITOT 0.6 11/30/2019 0818   BILITOT 0.7 10/06/2019 0826   GFRNONAA >60 11/30/2019 0818   GFRNONAA >60 10/06/2019 0826   GFRAA >60 11/30/2019 0818   GFRAA >60 10/06/2019 0826    No results found for: TOTALPROTELP, ALBUMINELP, A1GS, A2GS, BETS, BETA2SER, GAMS, MSPIKE, SPEI  Lab Results  Component Value Date   WBC 7.6 11/30/2019   NEUTROABS 4.4 11/30/2019   HGB 10.2 (L) 11/30/2019   HCT 32.3 (L) 11/30/2019   MCV 88.3 11/30/2019   PLT 174 11/30/2019    No results found for: LABCA2  No components found for:  [id number]  No results for input(s): INR in the last 168 hours.  No results found for: LABCA2  No results found for: NKN397  No results found for: QBH419  No results found for: FXT024  No results found for: CA2729  No components found for: HGQUANT  No results found for: CEA1 / No results found for: CEA1   No results found for: AFPTUMOR  No results found for: CHROMOGRNA  No results found for: KPAFRELGTCHN, LAMBDASER, KAPLAMBRATIO (kappa/lambda light chains)  No results found for: HGBA, HGBA2QUANT, HGBFQUANT, HGBSQUAN (Hemoglobinopathy evaluation)   No results found for: LDH  No results found for: IRON, TIBC, IRONPCTSAT (Iron and TIBC)  No results found for: FERRITIN  Urinalysis No results found for: COLORURINE, APPEARANCEUR, LABSPEC, PHURINE, GLUCOSEU, HGBUR, BILIRUBINUR, KETONESUR, PROTEINUR, UROBILINOGEN, NITRITE, LEUKOCYTESUR   STUDIES: MR BREAST BILATERAL W WO CONTRAST INC CAD  Result Date: 11/15/2019 CLINICAL DATA:  44 year old female with recently diagnosed right breast cancer. The patient presents for staging study, currently undergoing neoadjuvant treatment. LABS:  None performed on site. EXAM: BILATERAL BREAST MRI WITH AND WITHOUT CONTRAST TECHNIQUE: Multiplanar, multisequence MR images of both breasts were obtained prior to and following the intravenous administration of 10 ml of Gadavist Three-dimensional MR images were rendered by post-processing of the original MR data on an independent workstation. The three-dimensional MR images were interpreted, and findings are reported in the following complete MRI report for this study. Three dimensional images were evaluated at the independent DynaCad workstation COMPARISON:  Previous exam(s). FINDINGS: Breast composition: d. Extreme fibroglandular tissue. Background parenchymal enhancement: Moderate to marked. Right breast: An irregular, spiculated mass with mild progressive enhancement is demonstrated in the upper outer  quadrant of the right breast at posterior depth. This is best visualized on delayed time point (series 12, image 55/160). It measures approximately 4 x 3.6 x 1.9 cm, the precise measurements are difficult due to the vague appearance. Susceptibility artifact from a post biopsy clip is seen along the lateral periphery of the mass. An additional 11 x 10 x 8 mm, irregular enhancing mass is seen inferior and central to the index lesion (series 12, image 79/160). This demonstrates progressive enhancement kinetics. Note is made of diffuse skin thickening in the right breast, most notable in the superior, central aspect. No additional suspicious findings are identified in the remainder of the right  breast. Left breast: No suspicious mass or abnormal enhancement within the limitation of marked background enhancement. Lymph nodes: No abnormal appearing lymph nodes within the limitation of today study. Ancillary findings:  None. IMPRESSION: 1. 4 cm mass in the upper outer right breast consistent with the patient's biopsy-proven site of malignancy. 2. Indeterminate 1.1 mm a regular, enhancing mass in the central inferior right breast (series 12, image 79/160). Recommendation is for MRI guided biopsy. 3. Diffuse right breast skin thickening, most prominent along the superior and central aspects. Lymphovascular spread of disease not excluded. Skin punch biopsy, if not already performed, is recommended if this will alter clinical management. 4. No MRI evidence of malignancy on the left. RECOMMENDATION: MRI guided biopsy of an indeterminate, enhancing mass in the central inferior right breast (series 12, image 79). Skin punch biopsy, if not already performed, of the right breast if this will alter clinical management. BI-RADS CATEGORY  4: Suspicious. Electronically Signed   By: Kristopher Oppenheim M.D.   On: 11/15/2019 11:37     ELIGIBLE FOR AVAILABLE RESEARCH PROTOCOL: no  ASSESSMENT: 44 y.o. [place] speaker status  post right breast upper outer quadrant sites biopsy and right axillary lymph node biopsy 09/30/2019 for a clinical T3 N2, stage IIIA invasive ductal carcinoma, grade 3, estrogen and progesterone receptor negative, HER-2 amplified, with an MIB-1 of 80%.  (a) CT chest abdomen and pelvis and bone scan 10/15/2019 showed no evidence of metastatic disease  (1) genetics testing 10/13/2019 through the Common Hereditary Cancers Panel offered by [facility] found no deleterious mutations in APC, ATM, AXIN2, BARD1, BMPR1A, BRCA1, BRCA2, BRIP1, CDH1, CDKN2A (p14ARF), CDKN2A (p16INK4a), CKD4, CHEK2, CTNNA1, DICER1, EPCAM (Deletion/duplication testing only), GREM1 (promoter region deletion/duplication testing only), KIT, MEN1, MLH1, MSH2, MSH3, MSH6, MUTYH, NBN, NF1, NHTL1, PALB2, PDGFRA, PMS2, POLD1, POLE, PTEN, RAD50, RAD51C, RAD51D, RNF43, SDHB, SDHC, SDHD, SMAD4, SMARCA4. STK11, TP53, TSC1, TSC2, and VHL.  The following genes were evaluated for sequence changes only: SDHA and HOXB13 c.251G>A variant only.  (2) neoadjuvant chemotherapy to consist of trastuzumab, pertuzumab, docetaxel and carboplatin every 21 days x 6 starting 10/19/2019  (3) trastuzumab and pertuzumab to be continued to total 1 year  (a) echo 10/15/2019 shows an ejection fraction in the 65-70% range.  (4) definitive surgery to follow  (5) adjuvant radiation.   PLAN: Tamaka continues on neoadjuvant chemotherapy with Docetaxel, Carboplatin, Trastuzumab and Pertuzumab for her breast cancer.  She is tolerating it well and her breast cancer is responding also.  Her labs were reviewed with her today.  They remain stable and she will proceed with her third of six cycle of therapy.    Insiya was confused about how to take her dexamethasone.  The interpreter typed it into her AVS for me, and the nurses in infusion will print this out once she completes her chemotherapy.    Gisselle was recommended to continue with her activity.  She has not developed  peripheral neuropathy, and we are monitoring her closely for this.    She will return in 2 days for Fulphila (neulasta biosimilar) and then in 3 weeks for labs, f/u, and her next treatment.  She knows to call for any questions that may arise between now and her next appointment.  We are happy to see her sooner if needed.   Total encounter time 20 minutes.Wilber Bihari, NP 11/30/19 9:46 AM Medical Oncology and Hematology [facility] [place], [place] [address] Tel. [phone]  Fax. [phone]  *Total Encounter Time as defined by the Centers for Medicare and Medicaid Services includes, in addition to the face-to-face time of a patient visit (documented in the note above) non-face-to-face time: obtaining and reviewing outside history, ordering and reviewing medications, tests or procedures, care coordination (communications with other health care professionals or caregivers) and documentation in the medical record.

## 2019-11-30 NOTE — Patient Instructions (Signed)
[facility] Discharge Instructions for Patients Receiving Chemotherapy  Today you received the following chemotherapy agents Trastuzumab; Pertuzumab; Carboplatin; Docetaxel  To help prevent nausea and vomiting after your treatment, we encourage you to take your nausea medication as directed   If you develop nausea and vomiting that is not controlled by your nausea medication, call the clinic.   BELOW ARE SYMPTOMS THAT SHOULD BE REPORTED IMMEDIATELY:  *FEVER GREATER THAN 100.5 F  *CHILLS WITH OR WITHOUT FEVER  NAUSEA AND VOMITING THAT IS NOT CONTROLLED WITH YOUR NAUSEA MEDICATION  *UNUSUAL SHORTNESS OF BREATH  *UNUSUAL BRUISING OR BLEEDING  TENDERNESS IN MOUTH AND THROAT WITH OR WITHOUT PRESENCE OF ULCERS  *URINARY PROBLEMS  *BOWEL PROBLEMS  UNUSUAL RASH Items with * indicate a potential emergency and should be followed up as soon as possible.  Feel free to call the clinic should you have any questions or concerns. The clinic phone number is [phone].  Please show the CHEMO ALERT CARD at check-in to the Emergency Department and triage nurse.  Trastuzumab; Hyaluronidase injection Qu es este medicamento? TRASTUZUMAB; HIALURONIDASA se usa para tratar el cncer de mama y el cncer de estmago. El trastuzumab es un anticuerpo monoclonal. La hialuronidasa se usa para mejorar los efectos del trastuzumab. Este medicamento puede ser utilizado para otros usos; si tiene alguna pregunta consulte con su proveedor de atencin mdica o con su farmacutico. MARCAS COMUNES: HERCEPTIN HYLECTA Qu le debo informar a mi profesional de la salud antes de tomar este medicamento? Necesitan saber si usted presenta alguno de los siguientes problemas o situaciones: enfermedad cardiaca insuficiencia cardiaca enfermedad pulmonar o respiratoria, como asma una reaccin alrgica o inusual al trastuzumab, a otros medicamentos, alimentos, colorantes o conservantes si est embarazada o  buscando quedar embarazada si est amamantando a un beb Cmo debo utilizar este medicamento? Este medicamento se administra mediante una inyeccin por va subcutnea. Lo administra un profesional de la salud en un hospital o en un entorno clnico. Hable con su pediatra para informarse acerca del uso de este medicamento en nios. Este medicamento no est aprobado para uso en nios. Sobredosis: Pngase en contacto inmediatamente con un centro toxicolgico o una sala de urgencia si usted cree que haya tomado demasiado medicamento. ATENCIN: Este medicamento es solo para usted. No comparta este medicamento con nadie. Qu sucede si me olvido de una dosis? Es importante no olvidar ninguna dosis. Informe a su mdico o a su profesional de la salud si no puede asistir a una cita. Qu puede interactuar con este medicamento? Este medicamento podra interactuar con los siguientes frmacos: ciertos tipos de quimioterapia, tales como daunorubicina, doxorrubicina, epirubicina, e idarubicina Puede ser que esta lista no menciona todas las posibles interacciones. Informe a su profesional de la salud de todos los productos a base de hierbas, medicamentos de venta libre o suplementos nutritivos que est tomando. Si usted fuma, consume bebidas alcohlicas o si utiliza drogas ilegales, indqueselo tambin a su profesional de la salud. Algunas sustancias pueden interactuar con su medicamento. A qu debo estar atento al usar este medicamento? Visite a su mdico para que revise su evolucin. Si presenta algn efecto secundario, infrmelo. Contine con el tratamiento aun si se siente enfermo, a menos que su mdico le indique que lo suspenda. Consulte a su mdico o a su profesional de la salud si tiene fiebre, escalofros o dolor de garganta, o cualquier otro sntoma de resfro o gripe. No se trate usted mismo. Trate de no acercarse a personas que estn   enfermas. Es posible que tenga fiebre, escalofros y temblores durante  su primera infusin. Estos efectos generalmente son leves y pueden tratarse con otros medicamentos. Informe cualquier efecto secundario durante la infusin a su profesional de la salud. La fiebre y los escalofros por lo general no suceden con las infusiones posteriores. No debe quedar embarazada mientras est usando este medicamento o por 7 meses despus de dejar de usarlo. Las mujeres deben informar a su mdico si estn buscando quedar embarazadas o si creen que podran estar embarazadas. Las mujeres con la posibilidad de tener nios deben tener una prueba de embarazo negativa antes de empezar a tomar este medicamento. Existe la posibilidad de efectos secundarios graves en un beb sin nacer. Para obtener ms informacin, hable con su profesional de la salud o su farmacutico. No debe amamantar a un beb mientras est tomando este medicamento o por 7 meses despus de dejar de usarlo. Qu efectos secundarios puedo tener al utilizar este medicamento? Efectos secundarios que debe informar a su mdico o a su profesional de la salud tan pronto como sea posible: reacciones alrgicas, como erupcin cutnea, comezn/picazn o urticaria, e hinchazn de la cara, los labios o la lengua problemas respiratorios dolor en el pecho o palpitaciones tos fiebre sensacin general de estar enfermo o sntomas gripales signos de empeoramiento de la insuficiencia cardiaca, tales como problemas respiratorios; hinchazn en las piernas y los pies Efectos secundarios que generalmente no requieren atencin mdica (debe informarlos a su mdico o a su profesional de la salud si persisten o si son molestos): dolor de huesos cambios en el sentido del gusto diarrea dolor en las articulaciones nuseas, vmito cansancio o debilidad inusual prdida de peso Puede ser que esta lista no menciona todos los posibles efectos secundarios. Comunquese a su mdico por asesoramiento mdico sobre los efectos secundarios. Usted puede informar los efectos  secundarios a la FDA por telfono al 1-800-FDA-1088. Dnde debo guardar mi medicina? Este medicamento se administra en hospitales o clnicas, y no necesitar guardarlo en su domicilio. ATENCIN: Este folleto es un resumen. Puede ser que no cubra toda la posible informacin. Si usted tiene preguntas acerca de esta medicina, consulte con su mdico, su farmacutico o su profesional de la salud.  2020 Elsevier/Gold Standard (2018-04-06 00:00:00)  Pertuzumab injection Qu es este medicamento? El PERTUZUMAB es un anticuerpo monoclonal. Se utiliza para tratar el cncer de mama. Este medicamento puede ser utilizado para otros usos; si tiene alguna pregunta consulte con su proveedor de atencin mdica o con su farmacutico. MARCAS COMUNES: PERJETA Qu le debo informar a mi profesional de la salud antes de tomar este medicamento? Necesita saber si usted presenta alguno de los siguientes problemas o situaciones: enfermedad cardiaca insuficiencia cardiaca alta presin sangunea antecedentes de pulso cardiaca irregular radioterapia reciente o continuada una reaccin alrgica o inusual al pertuzumab, a otros medicamentos, alimentos, colorantes o conservantes si est embarazada o buscando quedar embarazada si est amamantando a un beb Cmo debo utilizar este medicamento? Este medicamento se administra mediante infusin por va intravenosa. Lo administra un profesional de la salud en un hospital o en un entorno clnico. Hable con su pediatra para informarse acerca del uso de este medicamento en nios. Puede requerir atencin especial. Sobredosis: Pngase en contacto inmediatamente con un centro toxicolgico o una sala de urgencia si usted cree que haya tomado demasiado medicamento. ATENCIN: Este medicamento es solo para usted. No comparta este medicamento con nadie. Qu sucede si me olvido de una dosis? Es importante no olvidar ninguna dosis.   Informe a su mdico o a su profesional de la salud si no puede  asistir a una cita. Qu puede interactuar con este medicamento? No se esperan interacciones. Puede ser que esta lista no menciona todas las posibles interacciones. Informe a su profesional de la salud de todos los productos a base de hierbas, medicamentos de venta libre o suplementos nutritivos que est tomando. Si usted fuma, consume bebidas alcohlicas o si utiliza drogas ilegales, indqueselo tambin a su profesional de la salud. Algunas sustancias pueden interactuar con su medicamento. A qu debo estar atento al usar este medicamento? Se supervisar su estado de salud atentamente mientras reciba este medicamento. Si presenta algn efecto secundario, infrmelo. Sin embargo, contine con el tratamiento aun si se siente enfermo, a menos que su mdico le indique que lo suspenda. No debe quedar embarazada mientras est tomando este medicamento o por 7 meses despus de dejar de usarlo. Las mujeres deben informar a su mdico si estn buscando quedar embarazadas o si creen que estn embarazadas. Las mujeres con la posibilidad de tener nios deben tener una prueba de embarazo negativa antes de empezar a tomar este medicamento. Existe la posibilidad de que ocurran efectos secundarios graves a un beb sin nacer. Para ms informacin hable con su profesional de la salud o su farmacutico. No debe amamantar a un beb mientras est tomando este medicamento o por 7 meses despus de dejar de usarlo. Las mujeres deben usar un mtodo anticonceptivo eficaz con este medicamento. Consulte a su mdico o a su profesional de la salud si tiene fiebre, escalofros, dolor de garganta, o cualquier otro sntoma de resfro o gripe. No se trate usted mismo. Trate de no acercarse a personas que estn enfermas. Es posible que tenga fiebre, escalofros y dolor de cabeza durante la infusin. Informe cualquier efecto secundario durante la infusin a su profesional de la salud. Qu efectos secundarios puedo tener al utilizar este  medicamento? Efectos secundarios que debe informar a su mdico o a su profesional de la salud tan pronto como sea posible: problemas respiratorios dolor en el pecho o palpitaciones mareos sensacin de desmayos o aturdimiento fiebre o escalofros erupcin cutnea, picazn o urticaria dolor de garganta hinchazn de la cara, labios o lengua hinchazn de piernas o tobillos cansancio o debilidad inusual Efectos secundarios que generalmente no requieren atencin mdica (infrmelos a su mdico o a su profesional de la salud si persisten o si son molestos): diarrea cada del cabello nuseas, vmito cansancio Puede ser que esta lista no menciona todos los posibles efectos secundarios. Comunquese a su mdico por asesoramiento mdico sobre los efectos secundarios. Usted puede informar los efectos secundarios a la FDA por telfono al 1-800-FDA-1088. Dnde debo guardar mi medicina? Este medicamento se administra en hospitales o clnicas y no necesitar guardarlo en su domicilio. ATENCIN: Este folleto es un resumen. Puede ser que no cubra toda la posible informacin. Si usted tiene preguntas acerca de esta medicina, consulte con su mdico, su farmacutico o su profesional de la salud.  2020 Elsevier/Gold Standard (2016-06-27 00:00:00)  Carboplatin injection Qu es este medicamento? El CARBOPLATINO es un agente quimioteraputico. Este medicamento acta sobre las clulas que se dividen rpidamente, como las clulas cancergenas, y finalmente provoca la muerte de estas clulas. Se utiliza en el tratamiento del cncer de ovario y muchos otros tipos de cncer. Este medicamento puede ser utilizado para otros usos; si tiene alguna pregunta consulte con su proveedor de atencin mdica o con su farmacutico. MARCAS COMUNES: Paraplatin Qu le debo informar   a mi profesional de la salud antes de tomar este medicamento? Necesita saber si usted presenta alguno de los siguientes problemas o situaciones:  trastornos  sanguneos  problemas auditivos  enfermedad renal  radioterapia reciente o continuada  una reaccin alrgica o inusual al carboplatino, al cisplatino, a otros agentes quimioteraputicos, a otros medicamentos, alimentos, colorantes o conservantes  si est embarazada o buscando quedar embarazada  si est amamantando a un beb Cmo debo utilizar este medicamento? Este medicamento se administra normalmente mediante infusin por va intravenosa. Lo administra un profesional de la salud calificado en un hospital o en un entorno clnico. Hable con su pediatra para informarse acerca del uso de este medicamento en nios. Puede requerir atencin especial. Sobredosis: Pngase en contacto inmediatamente con un centro toxicolgico o una sala de urgencia si usted cree que haya tomado demasiado medicamento. ATENCIN: Este medicamento es solo para usted. No comparta este medicamento con nadie. Qu sucede si me olvido de una dosis? Es importante no olvidar ninguna dosis. Informe a su mdico o a su profesional de la salud si no puede asistir a una cita. Qu puede interactuar con este medicamento?  medicamentos para convulsiones  medicamentos para incrementar los conteos sanguneos, tales como filgrastim, pegfilgrastim, sargramostim  ciertos antibiticos, tales como amicacina, gentamicina, neomicina, estreptomicina, tobramicina  vacunas Consulte a su mdico o a su profesional de la salud antes de tomar cualquiera de los siguientes medicamentos:  acetaminofeno  aspirina  ibuprofeno  quetoprofeno  naproxeno Puede ser que esta lista no menciona todas las posibles interacciones. Informe a su profesional de la salud de todos los productos a base de hierbas, medicamentos de venta libre o suplementos nutritivos que est tomando. Si usted fuma, consume bebidas alcohlicas o si utiliza drogas ilegales, indqueselo tambin a su profesional de la salud. Algunas sustancias pueden interactuar con su  medicamento. A qu debo estar atento al usar este medicamento? Se supervisar su estado de salud atentamente mientras reciba este medicamento. Tendr que hacerse anlisis de sangre peridicos mientras est tomando este medicamento. Este medicamento puede hacerle sentir un malestar general. Esto es normal ya que la quimioterapia afecta tanto a las clulas sanas como a las clulas cancerosas. Si presenta alguno de los efectos secundarios, infrmelos. Sin embargo, contine con el tratamiento aun si se siente enfermo, a menos que su mdico le indique que lo suspenda. En algunos casos, podr recibir medicamentos adicionales para ayudarle con los efectos secundarios. Siga las instrucciones para usarlos. Consulte a su mdico o a su profesional de la salud por asesoramiento si tiene fiebre, escalofros, dolor de garganta o cualquier otro sntoma de resfro o gripe. No se trate usted mismo. Este medicamento puede reducir la capacidad del cuerpo para combatir infecciones. Trate de no acercarse a personas que estn enfermas. Este medicamento puede aumentar el riesgo de magulladuras o sangrado. Consulte a su mdico o a su profesional de la salud si observa sangrados inusuales. Proceda con cuidado al cepillar sus dientes, usar hilo dental o utilizar palillos para los dientes, ya que puede contraer una infeccin o sangrar con mayor facilidad. Si se somete a algn tratamiento dental, informe a su dentista que est usando este medicamento. Evite tomar productos que contienen aspirina, acetaminofeno, ibuprofeno, naproxeno o quetoprofeno a menos que as lo indique su mdico. Estos productos pueden disimular la fiebre. No se debe quedar embarazada mientras recibe este medicamento. Las mujeres deben informar a su mdico si estn buscando quedar embarazadas o si creen que estn embarazadas. Existe la posibilidad de efectos secundarios   graves a un beb sin nacer. Para ms informacin hable con su profesional de la salud o su  farmacutico. No debe Economist a un beb mientras est usando este medicamento. Qu efectos secundarios puedo tener al [employer] este medicamento? Efectos secundarios que debe informar a su mdico o a Barrister's clerk de la salud tan pronto como sea posible:  Chief of Staff como erupcin cutnea, picazn o urticarias, hinchazn de la cara, labios o lengua  signos de infeccin - fiebre o escalofros, tos, dolor de garganta, dolor o dificultad para orinar  signos de reduccin de plaquetas o sangrado - magulladuras, puntos rojos en la piel, heces de color oscuro o con aspecto alquitranado, sangrando por la nariz  signos de reduccin de glbulos rojos - cansancio o debilidad inusual, desmayos, sensacin de [employer]  problemas respiratorios  cambios de audicin  cambios en la visin  dolor en el pecho  alta presin sangunea  conteos sanguneos bajos - Este medicamento puede reducir la cantidad de glbulos blancos, glbulos rojos y plaquetas. Su riesgo de infeccin y [place].  nuseas, vmito  dolor, enrojecimiento, hinchazn o irritacin en el lugar de la inyeccin  dolor, hormigueo, entumecimiento de manos o pies  problemas de coordinacin, del habla, al caminar  dificultad para orinar o cambios en el volumen de orina Efectos secundarios que, por lo general, no requieren atencin mdica (debe informarlos a su mdico o a su profesional de la salud si persisten o si son molestos):  cada del cabello  prdida del apetito  sabor metlico o cambios en el sentido del gusto Puede ser que esta lista no menciona todos los posibles efectos secundarios. Comunquese a su mdico por asesoramiento mdico [employer] Usted puede informar los efectos secundarios a la FDA por telfono al 1-800-FDA-1088. Dnde debo guardar mi medicina? Este medicamento se administra en hospitales o clnicas y no necesitar guardarlo en su domicilio. ATENCIN: Este folleto  es un resumen. Puede ser que no cubra toda la posible informacin. Si usted tiene preguntas acerca de esta medicina, consulte con su mdico, su farmacutico o su profesional de Technical sales engineer.  2020 Elsevier/Gold Standard (2014-07-19 00:00:00)  Docetaxel injection Qu es este medicamento? El DOCETAXEL es un agente quimioteraputico. Este medicamento acta sobre las clulas que se dividen rpidamente, como las clulas cancergenas, y finalmente provoca la muerte de estas clulas. Se utiliza en el tratamiento de muchos tipos de cncer como el cncer de mama, adenocarcinoma gstrico, cabeza y cuello, pulmn y prstata. Este medicamento puede ser utilizado para otros usos; si tiene alguna pregunta consulte con su proveedor de atencin mdica o con su farmacutico. MARCAS COMUNES: Docefrez, Taxotere Qu le debo informar a mi profesional de la salud antes de tomar este medicamento? Necesita saber si usted presenta alguno de los siguientes problemas o situaciones:  infeccin (especialmente infecciones virales, como varicela o herpes)  enfermedad heptica  conteos sanguneos bajos, como baja cantidad de glbulos blancos, glbulos rojos y plaquetas  una reaccin alrgica o inusual al docetaxel, polisorbato 80, a otros agentes quimioteraputicos, a otros medicamentos, alimentos, colorantes o conservantes  si est embarazada o buscando quedar embarazada  si est amamantando a un beb Cmo debo utilizar este medicamento? Este medicamento se administra como infusin en una vena. Un profesional de la salud especialmente capacitado lo administra en un hospital o clnica. Hable con su pediatra para informarse acerca del uso de este medicamento en nios. Puede requerir atencin especial. Sobredosis: Pngase en contacto inmediatamente con un centro toxicolgico o  una sala de urgencia si usted cree que haya tomado demasiado medicamento. ATENCIN: [employer] es solo para usted. No comparta este medicamento  con nadie. Qu sucede si me olvido de una dosis? Es importante no olvidar ninguna dosis. Informe a su mdico o a su profesional de la salud si no puede asistir a Photographer. Qu puede interactuar con este medicamento? aprepitant ciertos antibiticos, tales como eritromicina o claritromicina ciertos medicamentos antivirales para VIH o hepatitis ciertos medicamentos para infecciones micticas, tales como fluconazol, itraconazol, ketoconazol, posaconazol o voriconazol cimetidina ciprofloxacino conivaptn ciclosporina dronedarona fluvoxamina jugo de toronja (pomelo) imatinib verapamilo Puede ser que esta lista no menciona todas las posibles interacciones. Informe a su profesional de [employer]	[place] de [employer] productos a base de hierbas, medicamentos de [place] o suplementos nutritivos que est tomando. Si usted fuma, consume bebidas alcohlicas o si utiliza drogas ilegales, indqueselo tambin a su profesional de [employer]	[place]. Algunas sustancias pueden interactuar con su medicamento. A qu debo estar atento al usar [employer]? Se supervisar su estado de salud atentamente mientras reciba este medicamento. Tendr que hacerse anlisis de sangre importantes mientras est usando este medicamento. Llame a su mdico o a su profesional de la salud si tiene fiebre, escalofros o dolor de garganta, o cualquier otro sntoma de resfro o gripe. No se trate usted mismo. Este medicamento reduce la capacidad del cuerpo para combatir infecciones. Trate de no acercarse a personas que estn enfermas. Algunos productos pueden contener alcohol. Pregunte a su profesional de la salud si este medicamento contiene alcohol. Asegrese de informar a todos los profesionales de la salud que usted est usando [place]. Ciertos medicamentos, como metronidazol y disulfirm, pueden causar una reaccin desagradable cuando se usan con alcohol. Esta reaccin incluye enrojecimiento, dolor de cabeza, nuseas, vmitos, sudoracin y  aumento de la sed. La reaccin puede durar de 30 minutos a varias horas. Puede experimentar somnolencia o mareos. No conduzca, no utilice maquinaria ni haga nada que Associate Professor en estado de alerta hasta que sepa cmo le afecta este medicamento. No se siente ni se ponga de pie con rapidez, especialmente si es un paciente de edad avanzada. Esto reduce el riesgo de mareos o [employer]. El alcohol puede interferir con el efecto de este medicamento. Hable con su profesional de la salud sobre su riesgo de Hotel manager. Usted puede tener mayor riesgo para ciertos tipos de cncer si [place] este medicamento. No debe quedar embarazada mientras est usando este medicamento o por 6 meses despus de dejar de usarlo. Las mujeres deben informar a su mdico si estn buscando quedar embarazadas o si creen que podran estar embarazadas. Existe la posibilidad deEfectos secundarios graves en un beb sin nacer. Para obtener ms informacin, hable con su profesional de la salud o su farmacutico. No debe Economist a un beb mientras est usando este medicamento o por 1 semana despus de dejar de usarlo. Los hombres que reciben este medicamento deben usar un condn durante las relaciones sexuales con mujeres que puedan quedar embarazadas. Si usted embaraza a [employer], el beb podra tener defectos de nacimiento. El beb podra morir antes de Associate Professor. Deber seguir usando condn durante 3 meses despus de suspender el medicamento. Informe a su proveedor de Geophysical data processor de inmediato si su pareja queda embarazada mientras usted est [employer]. Esto puede interferir con la capacidad de los hombres para Social worker a Musician. Usted debe hablar con su mdico o su profesional de la salud si est preocupado por su  fertilidad. Qu efectos secundarios puedo tener al utilizar este medicamento? Efectos secundarios que debe informar a su mdico o a su profesional de la salud tan pronto como sea posible: reacciones alrgicas,  como erupcin cutnea, comezn/picazn o urticaria, e hinchazn de la cara, los labios o la lengua visin borrosa problemas para respirar cambios en la visin conteos sanguneos bajos: este frmaco podra reducir la cantidad de glbulos blancos, glbulos rojos y plaquetas. Su riesgo de infeccin y sangrado podra ser mayor. nuseas y vmito dolor, enrojecimiento o irritacin en el lugar de la inyeccin dolor, hormigueo o entumecimiento de las manos o los pies enrojecimiento, formacin de ampollas, descamacin o distensin de la piel, incluso dentro de la boca signos de disminucin en la cantidad de plaquetas o sangrado: moretones, puntos rojos en la piel, heces de color negro y aspecto alquitranado, sangrado por la nariz signos de disminucin en la cantidad de glbulos rojos: cansancio o debilidad inusual, desmayos, aturdimiento signos de infeccin: fiebre o escalofros, tos, dolor de garganta, dolor o dificultad para orinar hinchazn de tobillos, pies, manos Efectos secundarios que generalmente no requieren atencin mdica (infrmelos a su mdico o a su profesional de la salud si persisten o si son molestos): estreimiento diarrea cambios en las uas de las manos o de los pies cada del cabello prdida del apetito llagas en la boca dolor muscular Puede ser que esta lista no menciona todos los posibles efectos secundarios. Comunquese a su mdico por asesoramiento mdico sobre los efectos secundarios. Usted puede informar los efectos secundarios a la FDA por telfono al 1-800-FDA-1088. Dnde debo guardar mi medicina? Este medicamento se administra en hospitales o clnicas y no necesitar guardarlo en su domicilio. ATENCIN: Este folleto es un resumen. Puede ser que no cubra toda la posible informacin. Si usted tiene preguntas acerca de esta medicina, consulte con su mdico, su farmacutico o su profesional de la salud.  2020 Elsevier/Gold Standard (2019-03-31 00:00:00)    

## 2019-11-30 NOTE — Patient Instructions (Signed)
Tome 2 capsulas Dexamethasone dos veces al dia el dia antes de la quimoterapia y 2 capsulas dos veces al dia comenzando el dia despues de la quimoterapia [place].

## 2019-12-01 ENCOUNTER — Telehealth: Payer: Self-pay | Admitting: Adult Health

## 2019-12-01 NOTE — Telephone Encounter (Signed)
No 6/22 los. No changes made to pt's schedule.  

## 2019-12-02 ENCOUNTER — Other Ambulatory Visit: Payer: Self-pay

## 2019-12-02 ENCOUNTER — Inpatient Hospital Stay: Payer: Medicaid Other

## 2019-12-02 VITALS — BP 132/107 | HR 98 | Temp 98.3°F | Resp 18

## 2019-12-02 DIAGNOSIS — C50411 Malignant neoplasm of upper-outer quadrant of right female breast: Secondary | ICD-10-CM

## 2019-12-02 DIAGNOSIS — Z171 Estrogen receptor negative status [ER-]: Secondary | ICD-10-CM | POA: Diagnosis not present

## 2019-12-02 DIAGNOSIS — Z5112 Encounter for antineoplastic immunotherapy: Secondary | ICD-10-CM | POA: Diagnosis not present

## 2019-12-02 DIAGNOSIS — Z5189 Encounter for other specified aftercare: Secondary | ICD-10-CM | POA: Diagnosis not present

## 2019-12-02 DIAGNOSIS — C773 Secondary and unspecified malignant neoplasm of axilla and upper limb lymph nodes: Secondary | ICD-10-CM | POA: Diagnosis not present

## 2019-12-02 DIAGNOSIS — Z5111 Encounter for antineoplastic chemotherapy: Secondary | ICD-10-CM | POA: Diagnosis not present

## 2019-12-02 MED ORDER — PEGFILGRASTIM-JMDB 6 MG/0.6ML ~~LOC~~ SOSY
PREFILLED_SYRINGE | SUBCUTANEOUS | Status: AC
  Filled 2019-12-02: qty 0.6

## 2019-12-02 MED ORDER — PEGFILGRASTIM-JMDB 6 MG/0.6ML ~~LOC~~ SOSY
6.0000 mg | PREFILLED_SYRINGE | Freq: Once | SUBCUTANEOUS | Status: AC
  Administered 2019-12-02: 6 mg via SUBCUTANEOUS

## 2019-12-02 NOTE — Patient Instructions (Signed)

## 2019-12-09 DIAGNOSIS — Z419 Encounter for procedure for purposes other than remedying health state, unspecified: Secondary | ICD-10-CM | POA: Diagnosis not present

## 2019-12-20 NOTE — Progress Notes (Signed)
[facility]  [phone] [phone]     ID: Alice [name] DOB: 1976-04-20  MR#: [id number]  HFG#:[id number]  Patient Care Team: Verlon Au, MD as PCP - General (Family Medicine) Pershing Proud, RN as Oncology Nurse Navigator Donnelly Angelica, RN as Oncology Nurse Navigator Ovidio Kin, MD as Consulting Physician (General Surgery) Daymond Cordts, Valentino Hue, MD as Consulting Physician (Oncology) Dorothy Puffer, MD as Consulting Physician (Radiation Oncology) Lowella Dell, MD OTHER MD:  CHIEF COMPLAINT: estrogen receptor negative, Her2 positive breast cancer  CURRENT TREATMENT: Neoadjuvant chemotherapy (trastuzumab, pertuzumab, docetaxel, carboplatin )   INTERVAL HISTORY: Alice [name] returns today for follow-up and treatment of her estrogen receptor negative, Her2 positive breast cancer. She was last seen here on 11/30/2019.  She continues on neoadjuvant chemotherapy consisting of trastuzumab, pertuzumab, docetaxel, and carboplatin. Today is day 1 cycle 4 of 6 planned  She has had excellent nadir counts and has not required intermittent prophylactic antibiotics.  Alice [name]'s last echocardiogram on 10/15/2019, showed an ejection fraction in the 65% - 70% range.   Since her last visit here, she has not undergone any additional studies.     REVIEW OF SYSTEMS: Alice [name] has been feeling a bit more tired.  She is a bit more anemic which may account for that.  After a few days though she regains her usual activity level.  She feels her right breast is much better.  She says her family is doing well.  She has no nausea, no mouth sores, no problems with her port.  Her taste is changed but if she gets a little bit of lemon juice or vinegar to the food it tastes a little better.  She has about 3 loose bowel movements for a couple of mornings but otherwise no significant diarrhea.  She has had no menstrual period since May 2021 when we started chemo.  She is not  having hot flashes.  A detailed review of systems today was stable  HISTORY OF CURRENT ILLNESS: From the original intake note:  Alice [name] presented with right breast pain with swelling and skin changes (duskiness and dimpling) and left breast pain at 6 o'clock. She underwent bilateral diagnostic mammography with tomography and bilateral breast ultrasonography at [facility] on 09/23/2019 showing: breast density category C; 5.2 cm irregular mass in right breast spanning 10-12 o'clock, with marked peau d'orange changes concerning for inflammatory component; 3.3 cm enlarged lymph node in right axilla; indeterminate 1 cm oval mass in left breast at 5 o'clock.  Accordingly on 09/30/2019 she proceeded to biopsy of the right breast area in question. The pathology from this procedure ([id number]) showed: invasive mammary carcinoma, grade 3, e-cadherin positive. Prognostic indicators significant for: estrogen receptor, 0% negative and progesterone receptor, 0% negative. Proliferation marker Ki67 at 80%. HER2 positive by immunohistochemistry (3+).  The biopsied right axillary lymph node was positive for metastatic carcinoma.  She also underwent cyst aspiration of the 1 cm mass in the left breast the same day.  The patient's subsequent history is as detailed below.   PAST MEDICAL HISTORY: Past Medical History:  Diagnosis Date  . Hypertension     PAST SURGICAL HISTORY: Past Surgical History:  Procedure Laterality Date  . CHOLECYSTECTOMY    . IR IMAGING GUIDED PORT INSERTION  10/13/2019    FAMILY HISTORY: No family history on file.  As of 09/2019-- her father is 21 and her mother is 76. She has two brothers and one sister. There is no cancer in her family  to her knowledge.    GYNECOLOGIC HISTORY:  No LMP recorded. Menarche: 43 years old Age at first live birth: 44 years old GX P 2 LMP regular periods lasting 3 days; periods stopped after the first chemotherapy dose May 2021 Contraceptive:  previously used, has not used for the last 2 years HRT n/a  Hysterectomy? no BSO? no   SOCIAL HISTORY: (updated 09/2019)  Alice [name] is [place]  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 Husband [name] is currently unemployed but is a good Training and development officer. She lives at home with [name] and their two children. Son [name], age 13, has special needs.  Son [name], age 13, is in high school. She attends    ADVANCED DIRECTIVES: In the absence of any documentation to the contrary, the patient's spouse is their HCPOA.    HEALTH MAINTENANCE: Social History   Tobacco Use  . Smoking status: Never Smoker  . Smokeless tobacco: Never Used  Substance Use Topics  . Alcohol use: Never  . Drug use: Never     Colonoscopy: n/a (age)  PAP: 2020  Bone density: n/a (age)   No Known Allergies  Current Outpatient Medications  Medication Sig Dispense Refill  . dexamethasone (DECADRON) 4 MG tablet Take 2 tablets (8 mg total) by mouth 2 (two) times daily. Start the day before Taxotere. Take once the day after, then 2 times a day x 2d. 30 tablet 1  . doxycycline (VIBRA-TABS) 100 MG tablet Take 1 tablet (100 mg total) by mouth daily. 60 tablet 1  . ibuprofen (ADVIL,MOTRIN) 200 MG tablet Take 400 mg by mouth every 6 (six) hours as needed for fever, headache or cramping.     . lidocaine-prilocaine (EMLA) cream Apply to affected area once 30 g 3  . lisinopril (ZESTRIL) 10 MG tablet Take 10 mg by mouth daily.    Marland Kitchen loratadine (CLARITIN) 10 MG tablet Take 1 tablet (10 mg total) by mouth daily. 60 tablet 1  . LORazepam (ATIVAN) 0.5 MG tablet Take 1 tablet (0.5 mg total) by mouth at bedtime as needed (Nausea or vomiting). (Patient not taking: Reported on 11/30/2019) 20 tablet 0  . prochlorperazine (COMPAZINE) 10 MG tablet Take 1 tablet (10 mg total) by mouth every 6 (six) hours as needed (Nausea or vomiting). (Patient not taking: Reported on 11/30/2019) 30 tablet 1   No current  facility-administered medications for this visit.    OBJECTIVE: Spanish speaker in no acute distress  Vitals:   12/21/19 0854  BP: (!) 144/98  Pulse: (!) 105  Resp: 18  Temp: 98.7 F (37.1 C)  SpO2: 99%   Wt Readings from Last 3 Encounters:  12/21/19 202 lb 9.6 oz (91.9 kg)  11/30/19 205 lb 3.2 oz (93.1 kg)  11/09/19 204 lb 4.8 oz (92.7 kg)   Body mass index is 34.78 kg/m.    ECOG FS:1 - Symptomatic but completely ambulatory  Ocular: Sclerae unicteric, pupils round and equal Ear-nose-throat: Wearing a mask Lymphatic: No cervical or supraclavicular adenopathy Lungs no rales or rhonchi Heart regular rate and rhythm Abd soft, nontender, positive bowel sounds MSK no focal spinal tenderness, no joint edema Neuro: non-focal, well-oriented, appropriate affect Breasts: The right breast looks considerably more normal now and there is no palpable mass.  There is no palpable mass in the right axilla.  The left breast and left axilla are benign.   Right breast 10/06/2019     LAB RESULTS:  CMP  Component Value Date/Time   NA 140 11/30/2019 0818   K 3.6 11/30/2019 0818   CL 104 11/30/2019 0818   CO2 25 11/30/2019 0818   GLUCOSE 108 (H) 11/30/2019 0818   BUN 11 11/30/2019 0818   CREATININE 0.83 11/30/2019 0818   CREATININE 0.79 10/06/2019 0826   CALCIUM 8.8 (L) 11/30/2019 0818   PROT 7.0 11/30/2019 0818   ALBUMIN 3.7 11/30/2019 0818   AST 47 (H) 11/30/2019 0818   AST 26 10/06/2019 0826   ALT 69 (H) 11/30/2019 0818   ALT 47 (H) 10/06/2019 0826   ALKPHOS 182 (H) 11/30/2019 0818   BILITOT 0.6 11/30/2019 0818   BILITOT 0.7 10/06/2019 0826   GFRNONAA >60 11/30/2019 0818   GFRNONAA >60 10/06/2019 0826   GFRAA >60 11/30/2019 0818   GFRAA >60 10/06/2019 0826    No results found for: TOTALPROTELP, ALBUMINELP, A1GS, A2GS, BETS, BETA2SER, GAMS, MSPIKE, SPEI  Lab Results  Component Value Date   WBC 6.2 12/21/2019   NEUTROABS 3.5 12/21/2019   HGB 8.6 (L) 12/21/2019     HCT 26.9 (L) 12/21/2019   MCV 92.1 12/21/2019   PLT 269 12/21/2019    No results found for: LABCA2  No components found for: [id number]  No results for input(s): INR in the last 168 hours.  No results found for: LABCA2  No results found for: IEP329  No results found for: JJO841  No results found for: YSA630  No results found for: CA2729  No components found for: HGQUANT  No results found for: CEA1 / No results found for: CEA1   No results found for: AFPTUMOR  No results found for: CHROMOGRNA  No results found for: KPAFRELGTCHN, LAMBDASER, KAPLAMBRATIO (kappa/lambda light chains)  No results found for: HGBA, HGBA2QUANT, HGBFQUANT, HGBSQUAN (Hemoglobinopathy evaluation)   No results found for: LDH  No results found for: IRON, TIBC, IRONPCTSAT (Iron and TIBC)  No results found for: FERRITIN  Urinalysis No results found for: COLORURINE, APPEARANCEUR, LABSPEC, PHURINE, GLUCOSEU, HGBUR, BILIRUBINUR, KETONESUR, PROTEINUR, UROBILINOGEN, NITRITE, LEUKOCYTESUR   STUDIES: No results found.   ELIGIBLE FOR AVAILABLE RESEARCH PROTOCOL: no  ASSESSMENT: 44 y.o. [place] speaker status post right breast upper outer quadrant sites biopsy and right axillary lymph node biopsy 09/30/2019 for a clinical T3 N2, stage IIIA invasive ductal carcinoma, grade 3, estrogen and progesterone receptor negative, HER-2 amplified, with an MIB-1 of 80%.  (a) CT chest abdomen and pelvis and bone scan 10/15/2019 showed no evidence of metastatic disease  (1) genetics testing 10/13/2019 through the Common Hereditary Cancers Panel offered by [facility] found no deleterious mutations in APC, ATM, AXIN2, BARD1, BMPR1A, BRCA1, BRCA2, BRIP1, CDH1, CDKN2A (p14ARF), CDKN2A (p16INK4a), CKD4, CHEK2, CTNNA1, DICER1, EPCAM (Deletion/duplication testing only), GREM1 (promoter region deletion/duplication testing only), KIT, MEN1, MLH1, MSH2, MSH3, MSH6, MUTYH, NBN, NF1, NHTL1, PALB2, PDGFRA, PMS2, POLD1,  POLE, PTEN, RAD50, RAD51C, RAD51D, RNF43, SDHB, SDHC, SDHD, SMAD4, SMARCA4. STK11, TP53, TSC1, TSC2, and VHL.  The following genes were evaluated for sequence changes only: SDHA and HOXB13 c.251G>A variant only.  (2) neoadjuvant chemotherapy to consist of trastuzumab, pertuzumab, docetaxel and carboplatin every 21 days x 6 starting 10/19/2019  (3) trastuzumab and pertuzumab to be continued to total 1 year  (a) echo 10/15/2019 shows an ejection fraction in the 65-70% range.  (4) definitive surgery to follow  (5) adjuvant radiation.   PLAN: Nakeitha is tolerating her chemotherapy generally quite well and she will proceed to cycle 4 today.  She has 2 more before we repeat  her MRI, which I think will give [place] good news.  She is now anemic.  I am going to check her counts in a week and if the anemia is worse she will receive a transfusion that day  We discussed diet issues and she is going to add a little bit of lemon juice or vinegar to her food to make it more palatable.  She knows to call for any other issue that may develop before the next visit.  Total encounter time 20 minutes.Lurline Del, MD 12/21/19 9:08 AM [facility] Oncology [facility] H[facility] [place], [place] [address] Tel. [phone]    Fax. [phone]  I, Jacqualyn Posey am acting as a Education administrator for Chauncey Cruel, MD.   I, Lurline Del MD, have reviewed the above documentation for accuracy and completeness, and I agree with the above.    *Total Encounter Time as defined by the Centers for Medicare and Medicaid Services includes, in addition to the face-to-face time of a patient visit (documented in the note above) non-face-to-face time: obtaining and reviewing outside history, ordering and reviewing medications, tests or procedures, care coordination (communications with other health care professionals or caregivers) and documentation in the medical record.

## 2019-12-21 ENCOUNTER — Other Ambulatory Visit: Payer: Self-pay

## 2019-12-21 ENCOUNTER — Inpatient Hospital Stay: Payer: Medicaid Other

## 2019-12-21 ENCOUNTER — Inpatient Hospital Stay (HOSPITAL_BASED_OUTPATIENT_CLINIC_OR_DEPARTMENT_OTHER): Payer: Medicaid Other | Admitting: Oncology

## 2019-12-21 ENCOUNTER — Telehealth: Payer: Self-pay | Admitting: Oncology

## 2019-12-21 ENCOUNTER — Inpatient Hospital Stay: Payer: Medicaid Other | Attending: Oncology

## 2019-12-21 VITALS — HR 95

## 2019-12-21 VITALS — BP 144/98 | HR 105 | Temp 98.7°F | Resp 18 | Ht 64.0 in | Wt 202.6 lb

## 2019-12-21 DIAGNOSIS — Z170 Estrogen receptor positive status [ER+]: Secondary | ICD-10-CM

## 2019-12-21 DIAGNOSIS — Z5189 Encounter for other specified aftercare: Secondary | ICD-10-CM | POA: Diagnosis not present

## 2019-12-21 DIAGNOSIS — C773 Secondary and unspecified malignant neoplasm of axilla and upper limb lymph nodes: Secondary | ICD-10-CM | POA: Insufficient documentation

## 2019-12-21 DIAGNOSIS — C50411 Malignant neoplasm of upper-outer quadrant of right female breast: Secondary | ICD-10-CM

## 2019-12-21 DIAGNOSIS — Z5111 Encounter for antineoplastic chemotherapy: Secondary | ICD-10-CM | POA: Insufficient documentation

## 2019-12-21 DIAGNOSIS — Z95828 Presence of other vascular implants and grafts: Secondary | ICD-10-CM

## 2019-12-21 DIAGNOSIS — Z5112 Encounter for antineoplastic immunotherapy: Secondary | ICD-10-CM | POA: Diagnosis not present

## 2019-12-21 DIAGNOSIS — Z171 Estrogen receptor negative status [ER-]: Secondary | ICD-10-CM | POA: Insufficient documentation

## 2019-12-21 DIAGNOSIS — Z79899 Other long term (current) drug therapy: Secondary | ICD-10-CM | POA: Insufficient documentation

## 2019-12-21 LAB — CBC WITH DIFFERENTIAL/PLATELET
Abs Immature Granulocytes: 0.04 10*3/uL (ref 0.00–0.07)
Basophils Absolute: 0 10*3/uL (ref 0.0–0.1)
Basophils Relative: 0 %
Eosinophils Absolute: 0 10*3/uL (ref 0.0–0.5)
Eosinophils Relative: 0 %
HCT: 26.9 % — ABNORMAL LOW (ref 36.0–46.0)
Hemoglobin: 8.6 g/dL — ABNORMAL LOW (ref 12.0–15.0)
Immature Granulocytes: 1 %
Lymphocytes Relative: 34 %
Lymphs Abs: 2.1 10*3/uL (ref 0.7–4.0)
MCH: 29.5 pg (ref 26.0–34.0)
MCHC: 32 g/dL (ref 30.0–36.0)
MCV: 92.1 fL (ref 80.0–100.0)
Monocytes Absolute: 0.5 10*3/uL (ref 0.1–1.0)
Monocytes Relative: 8 %
Neutro Abs: 3.5 10*3/uL (ref 1.7–7.7)
Neutrophils Relative %: 57 %
Platelets: 269 10*3/uL (ref 150–400)
RBC: 2.92 MIL/uL — ABNORMAL LOW (ref 3.87–5.11)
RDW: 17.4 % — ABNORMAL HIGH (ref 11.5–15.5)
WBC: 6.2 10*3/uL (ref 4.0–10.5)
nRBC: 0 % (ref 0.0–0.2)

## 2019-12-21 LAB — COMPREHENSIVE METABOLIC PANEL
ALT: 62 U/L — ABNORMAL HIGH (ref 0–44)
AST: 31 U/L (ref 15–41)
Albumin: 3.7 g/dL (ref 3.5–5.0)
Alkaline Phosphatase: 156 U/L — ABNORMAL HIGH (ref 38–126)
Anion gap: 11 (ref 5–15)
BUN: 11 mg/dL (ref 6–20)
CO2: 25 mmol/L (ref 22–32)
Calcium: 8.4 mg/dL — ABNORMAL LOW (ref 8.9–10.3)
Chloride: 105 mmol/L (ref 98–111)
Creatinine, Ser: 0.76 mg/dL (ref 0.44–1.00)
GFR calc Af Amer: >60 mL/min (ref >60)
GFR calc non Af Amer: >60 mL/min (ref >60)
Glucose, Bld: 101 mg/dL — ABNORMAL HIGH (ref 70–99)
Potassium: 3.3 mmol/L — ABNORMAL LOW (ref 3.5–5.1)
Sodium: 141 mmol/L (ref 135–145)
Total Bilirubin: 0.5 mg/dL (ref 0.3–1.2)
Total Protein: 6.9 g/dL (ref 6.5–8.1)

## 2019-12-21 LAB — PREGNANCY, URINE: Preg Test, Ur: NEGATIVE

## 2019-12-21 MED ORDER — ACETAMINOPHEN 325 MG PO TABS
650.0000 mg | ORAL_TABLET | Freq: Once | ORAL | Status: AC
  Administered 2019-12-21: 650 mg via ORAL

## 2019-12-21 MED ORDER — SODIUM CHLORIDE 0.9 % IV SOLN
10.0000 mg | Freq: Once | INTRAVENOUS | Status: AC
  Administered 2019-12-21: 10 mg via INTRAVENOUS
  Filled 2019-12-21: qty 10

## 2019-12-21 MED ORDER — SODIUM CHLORIDE 0.9% FLUSH
10.0000 mL | INTRAVENOUS | Status: DC | PRN
  Administered 2019-12-21: 10 mL
  Filled 2019-12-21: qty 10

## 2019-12-21 MED ORDER — SODIUM CHLORIDE 0.9 % IV SOLN
150.0000 mg | Freq: Once | INTRAVENOUS | Status: AC
  Administered 2019-12-21: 150 mg via INTRAVENOUS
  Filled 2019-12-21: qty 150

## 2019-12-21 MED ORDER — ACETAMINOPHEN 325 MG PO TABS
ORAL_TABLET | ORAL | Status: AC
  Filled 2019-12-21: qty 2

## 2019-12-21 MED ORDER — SODIUM CHLORIDE 0.9 % IV SOLN
75.0000 mg/m2 | Freq: Once | INTRAVENOUS | Status: AC
  Administered 2019-12-21: 150 mg via INTRAVENOUS
  Filled 2019-12-21: qty 15

## 2019-12-21 MED ORDER — SODIUM CHLORIDE 0.9 % IV SOLN
750.0000 mg | Freq: Once | INTRAVENOUS | Status: AC
  Administered 2019-12-21: 750 mg via INTRAVENOUS
  Filled 2019-12-21: qty 75

## 2019-12-21 MED ORDER — PALONOSETRON HCL INJECTION 0.25 MG/5ML
0.2500 mg | Freq: Once | INTRAVENOUS | Status: AC
  Administered 2019-12-21: 0.25 mg via INTRAVENOUS

## 2019-12-21 MED ORDER — DIPHENHYDRAMINE HCL 25 MG PO CAPS
25.0000 mg | ORAL_CAPSULE | Freq: Once | ORAL | Status: AC
  Administered 2019-12-21: 25 mg via ORAL

## 2019-12-21 MED ORDER — SODIUM CHLORIDE 0.9 % IV SOLN
Freq: Once | INTRAVENOUS | Status: AC
  Filled 2019-12-21: qty 250

## 2019-12-21 MED ORDER — DIPHENHYDRAMINE HCL 25 MG PO CAPS
ORAL_CAPSULE | ORAL | Status: AC
  Filled 2019-12-21: qty 1

## 2019-12-21 MED ORDER — SODIUM CHLORIDE 0.9 % IV SOLN
420.0000 mg | Freq: Once | INTRAVENOUS | Status: AC
  Administered 2019-12-21: 420 mg via INTRAVENOUS
  Filled 2019-12-21: qty 14

## 2019-12-21 MED ORDER — HEPARIN SOD (PORK) LOCK FLUSH 100 UNIT/ML IV SOLN
500.0000 [IU] | Freq: Once | INTRAVENOUS | Status: AC | PRN
  Administered 2019-12-21: 500 [IU]
  Filled 2019-12-21: qty 5

## 2019-12-21 MED ORDER — TRASTUZUMAB-DKST CHEMO 150 MG IV SOLR
600.0000 mg | Freq: Once | INTRAVENOUS | Status: AC
  Administered 2019-12-21: 600 mg via INTRAVENOUS
  Filled 2019-12-21: qty 28.57

## 2019-12-21 MED ORDER — PALONOSETRON HCL INJECTION 0.25 MG/5ML
INTRAVENOUS | Status: AC
  Filled 2019-12-21: qty 5

## 2019-12-21 NOTE — Patient Instructions (Signed)
[facility] Discharge Instructions for Patients Receiving Chemotherapy  Today you received the following chemotherapy agents Trastuzumab; Pertuzumab; Carboplatin; Docetaxel  To help prevent nausea and vomiting after your treatment, we encourage you to take your nausea medication as directed   If you develop nausea and vomiting that is not controlled by your nausea medication, call the clinic.   BELOW ARE SYMPTOMS THAT SHOULD BE REPORTED IMMEDIATELY:  *FEVER GREATER THAN 100.5 F  *CHILLS WITH OR WITHOUT FEVER  NAUSEA AND VOMITING THAT IS NOT CONTROLLED WITH YOUR NAUSEA MEDICATION  *UNUSUAL SHORTNESS OF BREATH  *UNUSUAL BRUISING OR BLEEDING  TENDERNESS IN MOUTH AND THROAT WITH OR WITHOUT PRESENCE OF ULCERS  *URINARY PROBLEMS  *BOWEL PROBLEMS  UNUSUAL RASH Items with * indicate a potential emergency and should be followed up as soon as possible.  Feel free to call the clinic should you have any questions or concerns. The clinic phone number is [phone].  Please show the CHEMO ALERT CARD at check-in to the Emergency Department and triage nurse.  Trastuzumab; Hyaluronidase injection Qu es este medicamento? TRASTUZUMAB; HIALURONIDASA se usa para tratar el cncer de mama y el cncer de estmago. El trastuzumab es un anticuerpo monoclonal. La hialuronidasa se usa para mejorar los efectos del trastuzumab. Este medicamento puede ser utilizado para otros usos; si tiene alguna pregunta consulte con su proveedor de atencin mdica o con su farmacutico. MARCAS COMUNES: HERCEPTIN HYLECTA Qu le debo informar a mi profesional de la salud antes de tomar este medicamento? Necesitan saber si usted presenta alguno de los siguientes problemas o situaciones: enfermedad cardiaca insuficiencia cardiaca enfermedad pulmonar o respiratoria, como asma una reaccin alrgica o inusual al trastuzumab, a otros medicamentos, alimentos, colorantes o conservantes si est embarazada o  buscando quedar embarazada si est amamantando a un beb Cmo debo utilizar este medicamento? Este medicamento se administra mediante una inyeccin por va subcutnea. Lo administra un profesional de la salud en un hospital o en un entorno clnico. Hable con su pediatra para informarse acerca del uso de este medicamento en nios. Este medicamento no est aprobado para uso en nios. Sobredosis: Pngase en contacto inmediatamente con un centro toxicolgico o una sala de urgencia si usted cree que haya tomado demasiado medicamento. ATENCIN: Este medicamento es solo para usted. No comparta este medicamento con nadie. Qu sucede si me olvido de una dosis? Es importante no olvidar ninguna dosis. Informe a su mdico o a su profesional de la salud si no puede asistir a una cita. Qu puede interactuar con este medicamento? Este medicamento podra interactuar con los siguientes frmacos: ciertos tipos de quimioterapia, tales como daunorubicina, doxorrubicina, epirubicina, e idarubicina Puede ser que esta lista no menciona todas las posibles interacciones. Informe a su profesional de la salud de todos los productos a base de hierbas, medicamentos de venta libre o suplementos nutritivos que est tomando. Si usted fuma, consume bebidas alcohlicas o si utiliza drogas ilegales, indqueselo tambin a su profesional de la salud. Algunas sustancias pueden interactuar con su medicamento. A qu debo estar atento al usar este medicamento? Visite a su mdico para que revise su evolucin. Si presenta algn efecto secundario, infrmelo. Contine con el tratamiento aun si se siente enfermo, a menos que su mdico le indique que lo suspenda. Consulte a su mdico o a su profesional de la salud si tiene fiebre, escalofros o dolor de garganta, o cualquier otro sntoma de resfro o gripe. No se trate usted mismo. Trate de no acercarse a personas que estn   enfermas. Es posible que tenga fiebre, escalofros y temblores durante  su primera infusin. Estos efectos generalmente son leves y pueden tratarse con otros medicamentos. Informe cualquier efecto secundario durante la infusin a su profesional de la salud. La fiebre y los escalofros por lo general no suceden con las infusiones posteriores. No debe quedar embarazada mientras est usando este medicamento o por 7 meses despus de dejar de usarlo. Las mujeres deben informar a su mdico si estn buscando quedar embarazadas o si creen que podran estar embarazadas. Las mujeres con la posibilidad de tener nios deben tener una prueba de embarazo negativa antes de empezar a tomar este medicamento. Existe la posibilidad de efectos secundarios graves en un beb sin nacer. Para obtener ms informacin, hable con su profesional de la salud o su farmacutico. No debe amamantar a un beb mientras est tomando este medicamento o por 7 meses despus de dejar de usarlo. Qu efectos secundarios puedo tener al utilizar este medicamento? Efectos secundarios que debe informar a su mdico o a su profesional de la salud tan pronto como sea posible: reacciones alrgicas, como erupcin cutnea, comezn/picazn o urticaria, e hinchazn de la cara, los labios o la lengua problemas respiratorios dolor en el pecho o palpitaciones tos fiebre sensacin general de estar enfermo o sntomas gripales signos de empeoramiento de la insuficiencia cardiaca, tales como problemas respiratorios; hinchazn en las piernas y los pies Efectos secundarios que generalmente no requieren atencin mdica (debe informarlos a su mdico o a su profesional de la salud si persisten o si son molestos): dolor de huesos cambios en el sentido del gusto diarrea dolor en las articulaciones nuseas, vmito cansancio o debilidad inusual prdida de peso Puede ser que esta lista no menciona todos los posibles efectos secundarios. Comunquese a su mdico por asesoramiento mdico sobre los efectos secundarios. Usted puede informar los efectos  secundarios a la FDA por telfono al 1-800-FDA-1088. Dnde debo guardar mi medicina? Este medicamento se administra en hospitales o clnicas, y no necesitar guardarlo en su domicilio. ATENCIN: Este folleto es un resumen. Puede ser que no cubra toda la posible informacin. Si usted tiene preguntas acerca de esta medicina, consulte con su mdico, su farmacutico o su profesional de la salud.  2020 Elsevier/Gold Standard (2018-04-06 00:00:00)  Pertuzumab injection Qu es este medicamento? El PERTUZUMAB es un anticuerpo monoclonal. Se utiliza para tratar el cncer de mama. Este medicamento puede ser utilizado para otros usos; si tiene alguna pregunta consulte con su proveedor de atencin mdica o con su farmacutico. MARCAS COMUNES: PERJETA Qu le debo informar a mi profesional de la salud antes de tomar este medicamento? Necesita saber si usted presenta alguno de los siguientes problemas o situaciones: enfermedad cardiaca insuficiencia cardiaca alta presin sangunea antecedentes de pulso cardiaca irregular radioterapia reciente o continuada una reaccin alrgica o inusual al pertuzumab, a otros medicamentos, alimentos, colorantes o conservantes si est embarazada o buscando quedar embarazada si est amamantando a un beb Cmo debo utilizar este medicamento? Este medicamento se administra mediante infusin por va intravenosa. Lo administra un profesional de la salud en un hospital o en un entorno clnico. Hable con su pediatra para informarse acerca del uso de este medicamento en nios. Puede requerir atencin especial. Sobredosis: Pngase en contacto inmediatamente con un centro toxicolgico o una sala de urgencia si usted cree que haya tomado demasiado medicamento. ATENCIN: Este medicamento es solo para usted. No comparta este medicamento con nadie. Qu sucede si me olvido de una dosis? Es importante no olvidar ninguna dosis.   Informe a su mdico o a su profesional de la salud si no puede  asistir a una cita. Qu puede interactuar con este medicamento? No se esperan interacciones. Puede ser que esta lista no menciona todas las posibles interacciones. Informe a su profesional de la salud de todos los productos a base de hierbas, medicamentos de venta libre o suplementos nutritivos que est tomando. Si usted fuma, consume bebidas alcohlicas o si utiliza drogas ilegales, indqueselo tambin a su profesional de la salud. Algunas sustancias pueden interactuar con su medicamento. A qu debo estar atento al usar este medicamento? Se supervisar su estado de salud atentamente mientras reciba este medicamento. Si presenta algn efecto secundario, infrmelo. Sin embargo, contine con el tratamiento aun si se siente enfermo, a menos que su mdico le indique que lo suspenda. No debe quedar embarazada mientras est tomando este medicamento o por 7 meses despus de dejar de usarlo. Las mujeres deben informar a su mdico si estn buscando quedar embarazadas o si creen que estn embarazadas. Las mujeres con la posibilidad de tener nios deben tener una prueba de embarazo negativa antes de empezar a tomar este medicamento. Existe la posibilidad de que ocurran efectos secundarios graves a un beb sin nacer. Para ms informacin hable con su profesional de la salud o su farmacutico. No debe amamantar a un beb mientras est tomando este medicamento o por 7 meses despus de dejar de usarlo. Las mujeres deben usar un mtodo anticonceptivo eficaz con este medicamento. Consulte a su mdico o a su profesional de la salud si tiene fiebre, escalofros, dolor de garganta, o cualquier otro sntoma de resfro o gripe. No se trate usted mismo. Trate de no acercarse a personas que estn enfermas. Es posible que tenga fiebre, escalofros y dolor de cabeza durante la infusin. Informe cualquier efecto secundario durante la infusin a su profesional de la salud. Qu efectos secundarios puedo tener al utilizar este  medicamento? Efectos secundarios que debe informar a su mdico o a su profesional de la salud tan pronto como sea posible: problemas respiratorios dolor en el pecho o palpitaciones mareos sensacin de desmayos o aturdimiento fiebre o escalofros erupcin cutnea, picazn o urticaria dolor de garganta hinchazn de la cara, labios o lengua hinchazn de piernas o tobillos cansancio o debilidad inusual Efectos secundarios que generalmente no requieren atencin mdica (infrmelos a su mdico o a su profesional de la salud si persisten o si son molestos): diarrea cada del cabello nuseas, vmito cansancio Puede ser que esta lista no menciona todos los posibles efectos secundarios. Comunquese a su mdico por asesoramiento mdico sobre los efectos secundarios. Usted puede informar los efectos secundarios a la FDA por telfono al 1-800-FDA-1088. Dnde debo guardar mi medicina? Este medicamento se administra en hospitales o clnicas y no necesitar guardarlo en su domicilio. ATENCIN: Este folleto es un resumen. Puede ser que no cubra toda la posible informacin. Si usted tiene preguntas acerca de esta medicina, consulte con su mdico, su farmacutico o su profesional de la salud.  2020 Elsevier/Gold Standard (2016-06-27 00:00:00)  Carboplatin injection Qu es este medicamento? El CARBOPLATINO es un agente quimioteraputico. Este medicamento acta sobre las clulas que se dividen rpidamente, como las clulas cancergenas, y finalmente provoca la muerte de estas clulas. Se utiliza en el tratamiento del cncer de ovario y muchos otros tipos de cncer. Este medicamento puede ser utilizado para otros usos; si tiene alguna pregunta consulte con su proveedor de atencin mdica o con su farmacutico. MARCAS COMUNES: Paraplatin Qu le debo informar   a mi profesional de la salud antes de tomar este medicamento? Necesita saber si usted presenta alguno de los siguientes problemas o situaciones:  trastornos  sanguneos  problemas auditivos  enfermedad renal  radioterapia reciente o continuada  una reaccin alrgica o inusual al carboplatino, al cisplatino, a otros agentes quimioteraputicos, a otros medicamentos, alimentos, colorantes o conservantes  si est embarazada o buscando quedar embarazada  si est amamantando a un beb Cmo debo utilizar este medicamento? Este medicamento se administra normalmente mediante infusin por va intravenosa. Lo administra un profesional de la salud calificado en un hospital o en un entorno clnico. Hable con su pediatra para informarse acerca del uso de este medicamento en nios. Puede requerir atencin especial. Sobredosis: Pngase en contacto inmediatamente con un centro toxicolgico o una sala de urgencia si usted cree que haya tomado demasiado medicamento. ATENCIN: Este medicamento es solo para usted. No comparta este medicamento con nadie. Qu sucede si me olvido de una dosis? Es importante no olvidar ninguna dosis. Informe a su mdico o a su profesional de la salud si no puede asistir a una cita. Qu puede interactuar con este medicamento?  medicamentos para convulsiones  medicamentos para incrementar los conteos sanguneos, tales como filgrastim, pegfilgrastim, sargramostim  ciertos antibiticos, tales como amicacina, gentamicina, neomicina, estreptomicina, tobramicina  vacunas Consulte a su mdico o a su profesional de la salud antes de tomar cualquiera de los siguientes medicamentos:  acetaminofeno  aspirina  ibuprofeno  quetoprofeno  naproxeno Puede ser que esta lista no menciona todas las posibles interacciones. Informe a su profesional de la salud de todos los productos a base de hierbas, medicamentos de venta libre o suplementos nutritivos que est tomando. Si usted fuma, consume bebidas alcohlicas o si utiliza drogas ilegales, indqueselo tambin a su profesional de la salud. Algunas sustancias pueden interactuar con su  medicamento. A qu debo estar atento al usar este medicamento? Se supervisar su estado de salud atentamente mientras reciba este medicamento. Tendr que hacerse anlisis de sangre peridicos mientras est tomando este medicamento. Este medicamento puede hacerle sentir un malestar general. Esto es normal ya que la quimioterapia afecta tanto a las clulas sanas como a las clulas cancerosas. Si presenta alguno de los efectos secundarios, infrmelos. Sin embargo, contine con el tratamiento aun si se siente enfermo, a menos que su mdico le indique que lo suspenda. En algunos casos, podr recibir medicamentos adicionales para ayudarle con los efectos secundarios. Siga las instrucciones para usarlos. Consulte a su mdico o a su profesional de la salud por asesoramiento si tiene fiebre, escalofros, dolor de garganta o cualquier otro sntoma de resfro o gripe. No se trate usted mismo. Este medicamento puede reducir la capacidad del cuerpo para combatir infecciones. Trate de no acercarse a personas que estn enfermas. Este medicamento puede aumentar el riesgo de magulladuras o sangrado. Consulte a su mdico o a su profesional de la salud si observa sangrados inusuales. Proceda con cuidado al cepillar sus dientes, usar hilo dental o utilizar palillos para los dientes, ya que puede contraer una infeccin o sangrar con mayor facilidad. Si se somete a algn tratamiento dental, informe a su dentista que est usando este medicamento. Evite tomar productos que contienen aspirina, acetaminofeno, ibuprofeno, naproxeno o quetoprofeno a menos que as lo indique su mdico. Estos productos pueden disimular la fiebre. No se debe quedar embarazada mientras recibe este medicamento. Las mujeres deben informar a su mdico si estn buscando quedar embarazadas o si creen que estn embarazadas. Existe la posibilidad de efectos secundarios   graves a un beb sin nacer. Para ms informacin hable con su profesional de la salud o su  farmacutico. No debe Economist a un beb mientras est usando este medicamento. Qu efectos secundarios puedo tener al [employer] este medicamento? Efectos secundarios que debe informar a su mdico o a Barrister's clerk de la salud tan pronto como sea posible:  Chief of Staff como erupcin cutnea, picazn o urticarias, hinchazn de la cara, labios o lengua  signos de infeccin - fiebre o escalofros, tos, dolor de garganta, dolor o dificultad para orinar  signos de reduccin de plaquetas o sangrado - magulladuras, puntos rojos en la piel, heces de color oscuro o con aspecto alquitranado, sangrando por la nariz  signos de reduccin de glbulos rojos - cansancio o debilidad inusual, desmayos, sensacin de [employer]  problemas respiratorios  cambios de audicin  cambios en la visin  dolor en el pecho  alta presin sangunea  conteos sanguneos bajos - Este medicamento puede reducir la cantidad de glbulos blancos, glbulos rojos y plaquetas. Su riesgo de infeccin y [place].  nuseas, vmito  dolor, enrojecimiento, hinchazn o irritacin en el lugar de la inyeccin  dolor, hormigueo, entumecimiento de manos o pies  problemas de coordinacin, del habla, al caminar  dificultad para orinar o cambios en el volumen de orina Efectos secundarios que, por lo general, no requieren atencin mdica (debe informarlos a su mdico o a su profesional de la salud si persisten o si son molestos):  cada del cabello  prdida del apetito  sabor metlico o cambios en el sentido del gusto Puede ser que esta lista no menciona todos los posibles efectos secundarios. Comunquese a su mdico por asesoramiento mdico [employer] Usted puede informar los efectos secundarios a la FDA por telfono al 1-800-FDA-1088. Dnde debo guardar mi medicina? Este medicamento se administra en hospitales o clnicas y no necesitar guardarlo en su domicilio. ATENCIN: Este folleto  es un resumen. Puede ser que no cubra toda la posible informacin. Si usted tiene preguntas acerca de esta medicina, consulte con su mdico, su farmacutico o su profesional de Technical sales engineer.  2020 Elsevier/Gold Standard (2014-07-19 00:00:00)  Docetaxel injection Qu es este medicamento? El DOCETAXEL es un agente quimioteraputico. Este medicamento acta sobre las clulas que se dividen rpidamente, como las clulas cancergenas, y finalmente provoca la muerte de estas clulas. Se utiliza en el tratamiento de muchos tipos de cncer como el cncer de mama, adenocarcinoma gstrico, cabeza y cuello, pulmn y prstata. Este medicamento puede ser utilizado para otros usos; si tiene alguna pregunta consulte con su proveedor de atencin mdica o con su farmacutico. MARCAS COMUNES: Docefrez, Taxotere Qu le debo informar a mi profesional de la salud antes de tomar este medicamento? Necesita saber si usted presenta alguno de los siguientes problemas o situaciones:  infeccin (especialmente infecciones virales, como varicela o herpes)  enfermedad heptica  conteos sanguneos bajos, como baja cantidad de glbulos blancos, glbulos rojos y plaquetas  una reaccin alrgica o inusual al docetaxel, polisorbato 80, a otros agentes quimioteraputicos, a otros medicamentos, alimentos, colorantes o conservantes  si est embarazada o buscando quedar embarazada  si est amamantando a un beb Cmo debo utilizar este medicamento? Este medicamento se administra como infusin en una vena. Un profesional de la salud especialmente capacitado lo administra en un hospital o clnica. Hable con su pediatra para informarse acerca del uso de este medicamento en nios. Puede requerir atencin especial. Sobredosis: Pngase en contacto inmediatamente con un centro toxicolgico o  una sala de urgencia si usted cree que haya tomado demasiado medicamento. ATENCIN: [employer] es solo para usted. No comparta este medicamento  con nadie. Qu sucede si me olvido de una dosis? Es importante no olvidar ninguna dosis. Informe a su mdico o a su profesional de la salud si no puede asistir a Photographer. Qu puede interactuar con este medicamento? aprepitant ciertos antibiticos, tales como eritromicina o claritromicina ciertos medicamentos antivirales para VIH o hepatitis ciertos medicamentos para infecciones micticas, tales como fluconazol, itraconazol, ketoconazol, posaconazol o voriconazol cimetidina ciprofloxacino conivaptn ciclosporina dronedarona fluvoxamina jugo de toronja (pomelo) imatinib verapamilo Puede ser que esta lista no menciona todas las posibles interacciones. Informe a su profesional de [employer]	[place] de [employer] productos a base de hierbas, medicamentos de [place] o suplementos nutritivos que est tomando. Si usted fuma, consume bebidas alcohlicas o si utiliza drogas ilegales, indqueselo tambin a su profesional de [employer]	[place]. Algunas sustancias pueden interactuar con su medicamento. A qu debo estar atento al usar [employer]? Se supervisar su estado de salud atentamente mientras reciba este medicamento. Tendr que hacerse anlisis de sangre importantes mientras est usando este medicamento. Llame a su mdico o a su profesional de la salud si tiene fiebre, escalofros o dolor de garganta, o cualquier otro sntoma de resfro o gripe. No se trate usted mismo. Este medicamento reduce la capacidad del cuerpo para combatir infecciones. Trate de no acercarse a personas que estn enfermas. Algunos productos pueden contener alcohol. Pregunte a su profesional de la salud si este medicamento contiene alcohol. Asegrese de informar a todos los profesionales de la salud que usted est usando [place]. Ciertos medicamentos, como metronidazol y disulfirm, pueden causar una reaccin desagradable cuando se usan con alcohol. Esta reaccin incluye enrojecimiento, dolor de cabeza, nuseas, vmitos, sudoracin y  aumento de la sed. La reaccin puede durar de 30 minutos a varias horas. Puede experimentar somnolencia o mareos. No conduzca, no utilice maquinaria ni haga nada que Associate Professor en estado de alerta hasta que sepa cmo le afecta este medicamento. No se siente ni se ponga de pie con rapidez, especialmente si es un paciente de edad avanzada. Esto reduce el riesgo de mareos o [employer]. El alcohol puede interferir con el efecto de este medicamento. Hable con su profesional de la salud sobre su riesgo de Hotel manager. Usted puede tener mayor riesgo para ciertos tipos de cncer si [place] este medicamento. No debe quedar embarazada mientras est usando este medicamento o por 6 meses despus de dejar de usarlo. Las mujeres deben informar a su mdico si estn buscando quedar embarazadas o si creen que podran estar embarazadas. Existe la posibilidad deEfectos secundarios graves en un beb sin nacer. Para obtener ms informacin, hable con su profesional de la salud o su farmacutico. No debe Economist a un beb mientras est usando este medicamento o por 1 semana despus de dejar de usarlo. Los hombres que reciben este medicamento deben usar un condn durante las relaciones sexuales con mujeres que puedan quedar embarazadas. Si usted embaraza a [employer], el beb podra tener defectos de nacimiento. El beb podra morir antes de Associate Professor. Deber seguir usando condn durante 3 meses despus de suspender el medicamento. Informe a su proveedor de Geophysical data processor de inmediato si su pareja queda embarazada mientras usted est [employer]. Esto puede interferir con la capacidad de los hombres para Social worker a Musician. Usted debe hablar con su mdico o su profesional de la salud si est preocupado por su  fertilidad. Qu efectos secundarios puedo tener al utilizar este medicamento? Efectos secundarios que debe informar a su mdico o a su profesional de la salud tan pronto como sea posible: reacciones alrgicas,  como erupcin cutnea, comezn/picazn o urticaria, e hinchazn de la cara, los labios o la lengua visin borrosa problemas para respirar cambios en la visin conteos sanguneos bajos: este frmaco podra reducir la cantidad de glbulos blancos, glbulos rojos y plaquetas. Su riesgo de infeccin y sangrado podra ser mayor. nuseas y vmito dolor, enrojecimiento o irritacin en el lugar de la inyeccin dolor, hormigueo o entumecimiento de las manos o los pies enrojecimiento, formacin de ampollas, descamacin o distensin de la piel, incluso dentro de la boca signos de disminucin en la cantidad de plaquetas o sangrado: moretones, puntos rojos en la piel, heces de color negro y aspecto alquitranado, sangrado por la nariz signos de disminucin en la cantidad de glbulos rojos: cansancio o debilidad inusual, desmayos, aturdimiento signos de infeccin: fiebre o escalofros, tos, dolor de garganta, dolor o dificultad para orinar hinchazn de tobillos, pies, manos Efectos secundarios que generalmente no requieren atencin mdica (infrmelos a su mdico o a su profesional de la salud si persisten o si son molestos): estreimiento diarrea cambios en las uas de las manos o de los pies cada del cabello prdida del apetito llagas en la boca dolor muscular Puede ser que esta lista no menciona todos los posibles efectos secundarios. Comunquese a su mdico por asesoramiento mdico sobre los efectos secundarios. Usted puede informar los efectos secundarios a la FDA por telfono al 1-800-FDA-1088. Dnde debo guardar mi medicina? Este medicamento se administra en hospitales o clnicas y no necesitar guardarlo en su domicilio. ATENCIN: Este folleto es un resumen. Puede ser que no cubra toda la posible informacin. Si usted tiene preguntas acerca de esta medicina, consulte con su mdico, su farmacutico o su profesional de la salud.  2020 Elsevier/Gold Standard (2019-03-31 00:00:00)    

## 2019-12-21 NOTE — Telephone Encounter (Signed)
Scheduled appts per 7/13 los. Gave pt a print out of AVS.  °

## 2019-12-21 NOTE — Progress Notes (Signed)
K = 3.3 - no supplementation needed today per Dr. Jana Hakim.  Will monitor.  Kennith Center, Pharm.D., CPP 12/21/2019@10 :19 AM

## 2019-12-23 ENCOUNTER — Inpatient Hospital Stay: Payer: Medicaid Other

## 2019-12-23 ENCOUNTER — Other Ambulatory Visit: Payer: Self-pay

## 2019-12-23 VITALS — BP 130/90 | HR 97 | Resp 18

## 2019-12-23 DIAGNOSIS — C50411 Malignant neoplasm of upper-outer quadrant of right female breast: Secondary | ICD-10-CM | POA: Diagnosis not present

## 2019-12-23 DIAGNOSIS — C773 Secondary and unspecified malignant neoplasm of axilla and upper limb lymph nodes: Secondary | ICD-10-CM | POA: Diagnosis not present

## 2019-12-23 DIAGNOSIS — Z170 Estrogen receptor positive status [ER+]: Secondary | ICD-10-CM

## 2019-12-23 DIAGNOSIS — Z171 Estrogen receptor negative status [ER-]: Secondary | ICD-10-CM | POA: Diagnosis not present

## 2019-12-23 DIAGNOSIS — Z5112 Encounter for antineoplastic immunotherapy: Secondary | ICD-10-CM | POA: Diagnosis not present

## 2019-12-23 DIAGNOSIS — Z79899 Other long term (current) drug therapy: Secondary | ICD-10-CM | POA: Diagnosis not present

## 2019-12-23 DIAGNOSIS — Z5111 Encounter for antineoplastic chemotherapy: Secondary | ICD-10-CM | POA: Diagnosis not present

## 2019-12-23 DIAGNOSIS — Z5189 Encounter for other specified aftercare: Secondary | ICD-10-CM | POA: Diagnosis not present

## 2019-12-23 MED ORDER — PEGFILGRASTIM-JMDB 6 MG/0.6ML ~~LOC~~ SOSY
6.0000 mg | PREFILLED_SYRINGE | Freq: Once | SUBCUTANEOUS | Status: AC
  Administered 2019-12-23: 6 mg via SUBCUTANEOUS

## 2019-12-23 MED ORDER — PEGFILGRASTIM-JMDB 6 MG/0.6ML ~~LOC~~ SOSY
PREFILLED_SYRINGE | SUBCUTANEOUS | Status: AC
  Filled 2019-12-23: qty 0.6

## 2019-12-23 NOTE — Patient Instructions (Signed)
Pegfilgrastim injection Qu es este medicamento? El PEGFILGRASTIM es un factor estimulante de colonias de granulocitos de accin prolongada que estimula el crecimiento de los neutrfilos, un tipo de glbulo blanco importante en la lucha del cuerpo contra la infeccin. Se utiliza para reducir la incidencia de la fiebre y la infeccin en pacientes con ciertos tipos de cncer que reciben quimioterapia que afecta la mdula sea, y para aumentar la supervivencia despus de estar expuesto a altas dosis de radiacin. Este medicamento puede ser utilizado para otros usos; si tiene alguna pregunta consulte con su proveedor de atencin mdica o con su farmacutico. MARCAS COMUNES: Fulphila, Neulasta, UDENYCA, Ziextenzo Qu le debo informar a mi profesional de la salud antes de tomar este medicamento? Necesita saber si usted presenta alguno de los siguientes problemas o situaciones:  enfermedad renal  alergia al ltex  si actualmente recibe radioterapia  anemia drepanoctica  reacciones cutneas a adhesivos acrlicos (On-Body Injector solamente, su nombre en ingls)  una reaccin alrgica o inusual al pegfilgrastim, al filgrastim, a otros medicamentos, alimentos, colorantes o conservantes  si est embarazada o buscando quedar embarazada  si est amamantando a un beb Cmo debo utilizar este medicamento? Este medicamento se administra mediante una inyeccin por va subcutnea. Si recibe este medicamento en su casa, le ensearn cmo preparar y administrar la jeringa prellenada o cmo usar el inyector en el cuerpo (su nombre en ingls, On-body Injector). Consulte las instrucciones de uso para el paciente para obtener instrucciones detalladas. selo exactamente como se le indique. Informe de inmediato a su proveedor de atencin de la salud si sospecha que su inyector en el cuerpo (en ingls "On-body Injector") puede haber funcionado incorrectamente o si sospecha que el uso de su inyector en el cuerpo  hizo que usted recibiera una dosis parcial o no recibiera una dosis. Es importante que deseche las agujas y las jeringas usadas en un recipiente resistente a los pinchazos. No los deseche en la basura. Si no tiene un recipiente resistente a los pinchazos, consulte a su farmacutico o proveedor de atencin de la salud para obtenerlo. Hable con su pediatra para informarse acerca del uso de este medicamento en nios. Aunque este medicamento se puede recetar en casos selectos, existen precauciones que deben tomarse. Sobredosis: Pngase en contacto inmediatamente con un centro toxicolgico o una sala de urgencia si usted cree que haya tomado demasiado medicamento. ATENCIN: Este medicamento es solo para usted. No comparta este medicamento con nadie. Qu sucede si me olvido de una dosis? Es importante no olvidar ninguna dosis. Comunquese con su mdico o profesional de la salud si se olvida una dosis. Si se olvida una dosis debido a un fallo del inyector en el cuerpo (su nombre en ingls, On-body Injector). o fugas, una nueva dosis debe ser administrada tan pronto como sea posible usando una sola jeringa prellenada para uso manual. Qu puede interactuar con este medicamento? No se han estudiado las interacciones. Puede ser que esta lista no menciona todas las posibles interacciones. Informe a su profesional de la salud de todos los productos a base de hierbas, medicamentos de venta libre o suplementos nutritivos que est tomando. Si usted fuma, consume bebidas alcohlicas o si utiliza drogas ilegales, indqueselo tambin a su profesional de la salud. Algunas sustancias pueden interactuar con su medicamento. A qu debo estar atento al usar este medicamento? Usted podr necesitar realizarse anlisis de sangre mientras est usando este medicamento. Si va a someterse a un IRM (MRI), tomografa computarizada u otro procedimiento, informe a su   mdico que est usando este medicamento (su nombre en ingls, On-body  Injector solamente). Qu efectos secundarios puedo tener al utilizar este medicamento? Efectos secundarios que debe informar a su mdico o a su profesional de la salud tan pronto como sea posible: reacciones alrgicas, como erupcin cutnea, comezn/picazn o urticaria, e hinchazn de la cara, los labios o la lengua dolor de espalda mareos fiebre dolor, enrojecimiento o irritacin en el lugar de la inyeccin puntos rojos en la piel orina roja o marrn oscuro falta de aire o problemas respiratorios dolor de estmago o del costado, o dolor en el hombro hinchazn cansancio dificultad para orinar o cambios en el volumen de orina Efectos secundarios que generalmente no requieren atencin mdica (infrmelos a su mdico o a su profesional de la salud si persisten o si son molestos): dolor de huesos dolor muscular Puede ser que esta lista no menciona todos los posibles efectos secundarios. Comunquese a su mdico por asesoramiento mdico sobre los efectos secundarios. Usted puede informar los efectos secundarios a la FDA por telfono al 1-800-FDA-1088. Dnde debo guardar mi medicina? Mantenga fuera del alcance de los nios. Si est usando este medicamento en su casa, le indicarn cmo guardarlo. Deseche todo el medicamento que no haya utilizado despus de la fecha de vencimiento indicada en la etiqueta. ATENCIN: Este folleto es un resumen. Puede ser que no cubra toda la posible informacin. Si usted tiene preguntas acerca de esta medicina, consulte con su mdico, su farmacutico o su profesional de la salud.  2020 Elsevier/Gold Standard (2017-11-10 00:00:00)  

## 2019-12-30 ENCOUNTER — Other Ambulatory Visit: Payer: Self-pay

## 2019-12-30 ENCOUNTER — Inpatient Hospital Stay: Payer: Medicaid Other

## 2019-12-30 ENCOUNTER — Other Ambulatory Visit: Payer: Self-pay | Admitting: Oncology

## 2019-12-30 ENCOUNTER — Other Ambulatory Visit: Payer: Self-pay | Admitting: *Deleted

## 2019-12-30 DIAGNOSIS — Z5189 Encounter for other specified aftercare: Secondary | ICD-10-CM | POA: Diagnosis not present

## 2019-12-30 DIAGNOSIS — Z5112 Encounter for antineoplastic immunotherapy: Secondary | ICD-10-CM | POA: Diagnosis not present

## 2019-12-30 DIAGNOSIS — C50411 Malignant neoplasm of upper-outer quadrant of right female breast: Secondary | ICD-10-CM | POA: Diagnosis not present

## 2019-12-30 DIAGNOSIS — Z95828 Presence of other vascular implants and grafts: Secondary | ICD-10-CM

## 2019-12-30 DIAGNOSIS — Z170 Estrogen receptor positive status [ER+]: Secondary | ICD-10-CM

## 2019-12-30 DIAGNOSIS — Z5111 Encounter for antineoplastic chemotherapy: Secondary | ICD-10-CM | POA: Diagnosis not present

## 2019-12-30 DIAGNOSIS — C773 Secondary and unspecified malignant neoplasm of axilla and upper limb lymph nodes: Secondary | ICD-10-CM | POA: Diagnosis not present

## 2019-12-30 DIAGNOSIS — Z171 Estrogen receptor negative status [ER-]: Secondary | ICD-10-CM | POA: Diagnosis not present

## 2019-12-30 DIAGNOSIS — Z79899 Other long term (current) drug therapy: Secondary | ICD-10-CM | POA: Diagnosis not present

## 2019-12-30 LAB — CBC WITH DIFFERENTIAL/PLATELET
Abs Immature Granulocytes: 1.48 10*3/uL — ABNORMAL HIGH (ref 0.00–0.07)
Basophils Absolute: 0 10*3/uL (ref 0.0–0.1)
Basophils Relative: 0 %
Eosinophils Absolute: 0 10*3/uL (ref 0.0–0.5)
Eosinophils Relative: 0 %
HCT: 27.5 % — ABNORMAL LOW (ref 36.0–46.0)
Hemoglobin: 8.7 g/dL — ABNORMAL LOW (ref 12.0–15.0)
Immature Granulocytes: 8 %
Lymphocytes Relative: 16 %
Lymphs Abs: 2.9 10*3/uL (ref 0.7–4.0)
MCH: 29.6 pg (ref 26.0–34.0)
MCHC: 31.6 g/dL (ref 30.0–36.0)
MCV: 93.5 fL (ref 80.0–100.0)
Monocytes Absolute: 2.5 10*3/uL — ABNORMAL HIGH (ref 0.1–1.0)
Monocytes Relative: 14 %
Neutro Abs: 11 10*3/uL — ABNORMAL HIGH (ref 1.7–7.7)
Neutrophils Relative %: 62 %
Platelets: 304 10*3/uL (ref 150–400)
RBC: 2.94 MIL/uL — ABNORMAL LOW (ref 3.87–5.11)
RDW: 17.3 % — ABNORMAL HIGH (ref 11.5–15.5)
WBC: 18 10*3/uL — ABNORMAL HIGH (ref 4.0–10.5)
nRBC: 0.1 % (ref 0.0–0.2)

## 2019-12-30 LAB — COMPREHENSIVE METABOLIC PANEL
ALT: 102 U/L — ABNORMAL HIGH (ref 0–44)
AST: 63 U/L — ABNORMAL HIGH (ref 15–41)
Albumin: 4 g/dL (ref 3.5–5.0)
Alkaline Phosphatase: 232 U/L — ABNORMAL HIGH (ref 38–126)
Anion gap: 11 (ref 5–15)
BUN: 11 mg/dL (ref 6–20)
CO2: 25 mmol/L (ref 22–32)
Calcium: 9.5 mg/dL (ref 8.9–10.3)
Chloride: 102 mmol/L (ref 98–111)
Creatinine, Ser: 0.89 mg/dL (ref 0.44–1.00)
GFR calc Af Amer: >60 mL/min (ref >60)
GFR calc non Af Amer: >60 mL/min (ref >60)
Glucose, Bld: 123 mg/dL — ABNORMAL HIGH (ref 70–99)
Potassium: 4.3 mmol/L (ref 3.5–5.1)
Sodium: 138 mmol/L (ref 135–145)
Total Bilirubin: 0.3 mg/dL (ref 0.3–1.2)
Total Protein: 7.6 g/dL (ref 6.5–8.1)

## 2019-12-30 LAB — SAMPLE TO BLOOD BANK

## 2019-12-30 LAB — PREGNANCY, URINE: Preg Test, Ur: NEGATIVE

## 2019-12-30 MED ORDER — ALTEPLASE 2 MG IJ SOLR
2.0000 mg | Freq: Once | INTRAMUSCULAR | Status: AC | PRN
  Administered 2019-12-30: 2 mg
  Filled 2019-12-30: qty 2

## 2019-12-30 MED ORDER — ALTEPLASE 2 MG IJ SOLR
INTRAMUSCULAR | Status: AC
  Filled 2019-12-30: qty 2

## 2019-12-30 NOTE — Progress Notes (Signed)
Unable to get blood return from port. Cath-flo administered at 11:12.

## 2019-12-30 NOTE — Progress Notes (Signed)
NO blood transfusion today per MD Magrinat given the lab results.  Patient will be discharged after [facility] is evaluated by nurse since Altepase is in place.

## 2019-12-30 NOTE — Progress Notes (Signed)
Cathflo was administered to patient's port due to no blood return after accessing her twice this morning. Patient was observed for 2 hours post Cathflo administration without any blood return noted. She has been deaccessed and discharged.   MD notified via in basket message.

## 2020-01-03 ENCOUNTER — Telehealth: Payer: Self-pay | Admitting: *Deleted

## 2020-01-03 ENCOUNTER — Other Ambulatory Visit: Payer: Self-pay

## 2020-01-03 ENCOUNTER — Inpatient Hospital Stay (HOSPITAL_BASED_OUTPATIENT_CLINIC_OR_DEPARTMENT_OTHER): Payer: Medicaid Other | Admitting: Medical

## 2020-01-03 VITALS — BP 108/81 | HR 102 | Temp 98.6°F | Resp 18 | Ht 64.0 in | Wt 199.0 lb

## 2020-01-03 DIAGNOSIS — Z5189 Encounter for other specified aftercare: Secondary | ICD-10-CM | POA: Diagnosis not present

## 2020-01-03 DIAGNOSIS — C50411 Malignant neoplasm of upper-outer quadrant of right female breast: Secondary | ICD-10-CM | POA: Diagnosis not present

## 2020-01-03 DIAGNOSIS — Z170 Estrogen receptor positive status [ER+]: Secondary | ICD-10-CM | POA: Diagnosis not present

## 2020-01-03 DIAGNOSIS — R21 Rash and other nonspecific skin eruption: Secondary | ICD-10-CM

## 2020-01-03 DIAGNOSIS — Z79899 Other long term (current) drug therapy: Secondary | ICD-10-CM | POA: Diagnosis not present

## 2020-01-03 DIAGNOSIS — Z171 Estrogen receptor negative status [ER-]: Secondary | ICD-10-CM | POA: Diagnosis not present

## 2020-01-03 DIAGNOSIS — Z5112 Encounter for antineoplastic immunotherapy: Secondary | ICD-10-CM | POA: Diagnosis not present

## 2020-01-03 DIAGNOSIS — C773 Secondary and unspecified malignant neoplasm of axilla and upper limb lymph nodes: Secondary | ICD-10-CM | POA: Diagnosis not present

## 2020-01-03 DIAGNOSIS — Z5111 Encounter for antineoplastic chemotherapy: Secondary | ICD-10-CM | POA: Diagnosis not present

## 2020-01-03 MED ORDER — PREDNISONE 5 MG PO TABS: ORAL_TABLET | ORAL | 0 refills | Status: DC

## 2020-01-03 NOTE — Telephone Encounter (Signed)
This RN spoke with pt via [employer] per pt speaks spanish.  She states she received " an injection in my chest on Thursday and then on Saturday am she developed a body wide rash with welts "  Per above pt will come in for visit with [facility] today at 11 am.  Note - pt speaks spanish and interpreter requested for visit- due to short notice- may have to use digital interpreter if in person not available.

## 2020-01-03 NOTE — Progress Notes (Signed)
[facility] Progress Note   Alice [name] [id number] Jan 01, 1976 44 y.o.  Alice [name] is managed by Dr. Lurline Del  Actively treated with chemotherapy/immunotherapy/hormonal therapy: yes  Current therapy: Neoadjuvant chemotherapy (trastuzumab, pertuzumab, docetaxel, carboplatin ) with pegfilgrastim support  Last treated: 12/21/2019 (cycle #4, day #1)  Next scheduled appointment with provider: 01/11/2020  Assessment: Plan:    Malignant neoplasm of upper-outer quadrant of right breast in female, estrogen receptor positive ([place])  Rash of body - Plan: predniSONE (DELTASONE) 5 MG tablet   ER positive malignant neoplasm of the right breast: The patient is status post cycle 4, day 1 of trastuzumab, Pertuzumab, docetaxel, and carboplatin.  Rash following port flush with Cath-Flo: Ms. Carmell [name] was placed on a prednisone taper.  Please see After Visit Summary for patient specific instructions.  Future Appointments  Date Time Provider [place]  01/11/2020  8:30 AM [facility] LAB 2 [facility] None  01/11/2020  8:45 AM [facility] [place] FLUSH [facility] None  01/11/2020  9:00 AM Magrinat, Virgie Dad, MD [facility] None  01/11/2020  9:45 AM [facility] INFUSION [facility] None  01/13/2020 10:00 AM [facility] [place] FLUSH [facility] None  02/01/2020  8:30 AM [facility] LAB 6 [facility] None  02/01/2020  8:45 AM [facility] [place] FLUSH [facility] None  02/01/2020  9:00 AM Magrinat, Virgie Dad, MD [facility] None  02/01/2020  9:45 AM [facility] INFUSION [facility] None  02/03/2020 10:00 AM [facility] [place] [facility] None    No orders of the defined types were placed in this encounter.      Subjective:   Patient ID:  Alice [name] is a 44 y.o. (DOB [birth year]) female.  Chief Complaint: No chief complaint on file.   HPI Alice [name]  is a 44 y.o. female with a diagnosis of an ER positive malignant neoplasm of the right breast. She is followed by Dr. Jana Hakim and is  status post cycle 4, day 1 of trastuzumab, Pertuzumab, docetaxel, and carboplatin which was dosed on 12/21/2019.  The patient presents to the clinic today with a rash after having her port flushed with Cath-Flo. The rash is slightly better today. It occurs over most of the patient's body and is pruritic. She was seen today with an interpreter.    Medications: I have reviewed the patient's current medications.  Allergies: No Known Allergies  Past Medical History:  Diagnosis Date  . Hypertension     Past Surgical History:  Procedure Laterality Date  . CHOLECYSTECTOMY    . IR IMAGING GUIDED PORT INSERTION  10/13/2019    No family history on file.  Social History   Socioeconomic History  . Marital status: Married    Spouse name: Not on file  . Number of children: Not on file  . Years of education: Not on file  . Highest education level: Not on file  Occupational History  . Not on file  Tobacco Use  . Smoking status: Never Smoker  . Smokeless tobacco: Never Used  Substance and Sexual Activity  . Alcohol use: Never  . Drug use: Never  . Sexual activity: Not on file  Other Topics Concern  . Not on file  Social History Narrative  . Not on file   Social Determinants of Health   Financial Resource Strain:   . Difficulty of Paying Living Expenses:   Food Insecurity:   . Worried About Charity fundraiser in the Last Year:   . Arboriculturist in the Last Year:   Transportation Needs:   .  Lack of Transportation (Medical):   Marland Kitchen Lack of Transportation (Non-Medical):   Physical Activity:   . Days of Exercise per Week:   . Minutes of Exercise per Session:   Stress:   . Feeling of Stress :   Social Connections:   . Frequency of Communication with Friends and Family:   . Frequency of Social Gatherings with Friends and Family:   . Attends Religious Services:   . Active Member of Clubs or Organizations:   . Attends Archivist Meetings:   Marland Kitchen Marital Status:   Intimate  Partner Violence:   . Fear of Current or Ex-Partner:   . Emotionally Abused:   Marland Kitchen Physically Abused:   . Sexually Abused:     Past Medical History, Surgical history, Social history, and Family history were reviewed and updated as appropriate.   Please see review of systems for further details on the patient's review from today.   Review of Systems:  Review of Systems  Constitutional: Negative for chills, diaphoresis and fever.  HENT: Negative for trouble swallowing and voice change.   Respiratory: Negative for cough, chest tightness, shortness of breath and wheezing.   Cardiovascular: Negative for chest pain and palpitations.  Gastrointestinal: Negative for abdominal pain, constipation, diarrhea, nausea and vomiting.  Musculoskeletal: Negative for back pain and myalgias.  Skin: Positive for rash.  Neurological: Negative for dizziness, light-headedness and headaches.    Objective:   Physical Exam:  BP 108/81 (BP Location: Left Arm, Patient Position: Sitting)   Pulse 102   Temp 98.6 F (37 C) (Temporal)   Resp 18   Ht 5\' 4"  (1.626 m)   Wt 199 lb (90.3 kg)   SpO2 100%   BMI 34.16 kg/m  ECOG: 0  Physical Exam Constitutional:      General: She is not in acute distress.    Appearance: She is not diaphoretic.  HENT:     Head: Normocephalic and atraumatic.  Eyes:     General: No scleral icterus.       Right eye: No discharge.        Left eye: No discharge.     Conjunctiva/sclera: Conjunctivae normal.  Cardiovascular:     Rate and Rhythm: Normal rate and regular rhythm.     Heart sounds: Normal heart sounds. No murmur heard.  No friction rub. No gallop.   Pulmonary:     Effort: Pulmonary effort is normal. No respiratory distress.     Breath sounds: Normal breath sounds. No wheezing or rales.  Skin:    General: Skin is warm and dry.     Findings: Erythema and rash present.     Comments: There is a diffuse slightly erythematous macular rash over the bilateral upper  extremities and chest.  Neurological:     Mental Status: She is alert.     Coordination: Coordination normal.     Gait: Gait normal.  Psychiatric:        Mood and Affect: Mood normal.        Behavior: Behavior normal.        Thought Content: Thought content normal.        Judgment: Judgment normal.     Lab Review:     Component Value Date/Time   NA 138 12/30/2019 1119   K 4.3 12/30/2019 1119   CL 102 12/30/2019 1119   CO2 25 12/30/2019 1119   GLUCOSE 123 (H) 12/30/2019 1119   BUN 11 12/30/2019 1119   CREATININE 0.89 12/30/2019 1119  CREATININE 0.79 10/06/2019 0826   CALCIUM 9.5 12/30/2019 1119   PROT 7.6 12/30/2019 1119   ALBUMIN 4.0 12/30/2019 1119   AST 63 (H) 12/30/2019 1119   AST 26 10/06/2019 0826   ALT 102 (H) 12/30/2019 1119   ALT 47 (H) 10/06/2019 0826   ALKPHOS 232 (H) 12/30/2019 1119   BILITOT 0.3 12/30/2019 1119   BILITOT 0.7 10/06/2019 0826   GFRNONAA >60 12/30/2019 1119   GFRNONAA >60 10/06/2019 0826   GFRAA >60 12/30/2019 1119   GFRAA >60 10/06/2019 0826       Component Value Date/Time   WBC 18.0 (H) 12/30/2019 1119   RBC 2.94 (L) 12/30/2019 1119   HGB 8.7 (L) 12/30/2019 1119   HCT 27.5 (L) 12/30/2019 1119   PLT 304 12/30/2019 1119   MCV 93.5 12/30/2019 1119   MCH 29.6 12/30/2019 1119   MCHC 31.6 12/30/2019 1119   RDW 17.3 (H) 12/30/2019 1119   LYMPHSABS 2.9 12/30/2019 1119   MONOABS 2.5 (H) 12/30/2019 1119   EOSABS 0.0 12/30/2019 1119   BASOSABS 0.0 12/30/2019 1119   -------------------------------  Imaging from last 24 hours (if applicable):  Radiology interpretation: No results found.

## 2020-01-09 DIAGNOSIS — Z419 Encounter for procedure for purposes other than remedying health state, unspecified: Secondary | ICD-10-CM | POA: Diagnosis not present

## 2020-01-10 NOTE — Progress Notes (Signed)
[place]  [phone] [phone]     ID: Alice [name] DOB: [birth year]  MR#: [id number]  IRS#:[id number]  Patient Care Team: Drue Flirt, MD as PCP - General (Family Medicine) Mauro Kaufmann, RN as Oncology Nurse Navigator Rockwell Germany, RN as Oncology Nurse Navigator Alphonsa Overall, MD as Consulting Physician (General Surgery) Beula Joyner, Virgie Dad, MD as Consulting Physician (Oncology) Kyung Rudd, MD as Consulting Physician (Radiation Oncology) Chauncey Cruel, MD OTHER MD:  CHIEF COMPLAINT: estrogen receptor negative, Her2 positive breast cancer  CURRENT TREATMENT: Neoadjuvant chemotherapy (trastuzumab, pertuzumab, docetaxel, carboplatin )   INTERVAL HISTORY: Alice [name] returns today for follow-up and treatment of her estrogen receptor negative, Her2 positive breast cancer.  She is accompanied by a translator  She continues on neoadjuvant chemotherapy consisting of trastuzumab, pertuzumab, docetaxel, and carboplatin. Today is day 1 cycle 5 of 6 planned  She has had excellent nadir counts and has not required intermittent prophylactic antibiotics.  Alice [name] last echocardiogram on 10/15/2019, showed an ejection fraction in the 65% - 70% range.   Since her last visit here, she has not undergone any additional studies.     REVIEW OF SYSTEMS: Alice [name] is anemic and is having symptoms from her anemia.  She feels a little bit faint and very tired.  She has not had chest pain or palpitations.  She does say that her appetite is down and she has some altered taste.  She has rare diarrhea never more than 3 times daily and she keeps herself well-hydrated.  She is trying to walk for exercise.  A detailed review of systems today was otherwise stable   HISTORY OF CURRENT ILLNESS: From the original intake note:  Alice [name] presented with right breast pain with swelling and skin changes (duskiness and dimpling) and left breast pain at 6  o'clock. She underwent bilateral diagnostic mammography with tomography and bilateral breast ultrasonography at [facility] on 09/23/2019 showing: breast density category C; 5.2 cm irregular mass in right breast spanning 10-12 o'clock, with marked peau d'orange changes concerning for inflammatory component; 3.3 cm enlarged lymph node in right axilla; indeterminate 1 cm oval mass in left breast at 5 o'clock.  Accordingly on 09/30/2019 she proceeded to biopsy of the right breast area in question. The pathology from this procedure ([id number]) showed: invasive mammary carcinoma, grade 3, e-cadherin positive. Prognostic indicators significant for: estrogen receptor, 0% negative and progesterone receptor, 0% negative. Proliferation marker Ki67 at 80%. HER2 positive by immunohistochemistry (3+).  The biopsied right axillary lymph node was positive for metastatic carcinoma.  She also underwent cyst aspiration of the 1 cm mass in the left breast the same day.  The patient's subsequent history is as detailed below.   PAST MEDICAL HISTORY: Past Medical History:  Diagnosis Date   Hypertension     PAST SURGICAL HISTORY: Past Surgical History:  Procedure Laterality Date   CHOLECYSTECTOMY     IR IMAGING GUIDED PORT INSERTION  10/13/2019    FAMILY HISTORY: No family history on file.  As of 09/2019-- her father is 79 and her mother is 57. She has two brothers and one sister. There is no cancer in her family to her knowledge.    GYNECOLOGIC HISTORY:  No LMP recorded. Menarche: 44 years old Age at first live birth: 44 years old GX P 2 LMP regular periods lasting 3 days; periods stopped after the first chemotherapy dose May 2021 Contraceptive: previously used, has not used for the last 2 years HRT n/a  Hysterectomy? no BSO? no   SOCIAL HISTORY: (updated 09/2019)  Alice [name] is originally from the [name].  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 Husband Alice [name] is currently  unemployed but is a good Financial risk analyst. She lives at home with Alice [name] and their two children. Son Alice [name], age 63, has special needs.  Son Alice [name], age 53, is in high school. She attends    ADVANCED DIRECTIVES: In the absence of any documentation to the contrary, the patient's spouse is their HCPOA.    HEALTH MAINTENANCE: Social History   Tobacco Use   Smoking status: Never Smoker   Smokeless tobacco: Never Used  Substance Use Topics   Alcohol use: Never   Drug use: Never     Colonoscopy: n/a (age)  PAP: 2020  Bone density: n/a (age)   No Known Allergies  Current Outpatient Medications  Medication Sig Dispense Refill   dexamethasone (DECADRON) 4 MG tablet Take 2 tablets (8 mg total) by mouth 2 (two) times daily. Start the day before Taxotere. Take once the day after, then 2 times a day x 2d. 30 tablet 1   doxycycline (VIBRA-TABS) 100 MG tablet Take 1 tablet (100 mg total) by mouth daily. 60 tablet 1   ibuprofen (ADVIL,MOTRIN) 200 MG tablet Take 400 mg by mouth every 6 (six) hours as needed for fever, headache or cramping.      lidocaine-prilocaine (EMLA) cream Apply to affected area once 30 g 3   lisinopril (ZESTRIL) 10 MG tablet Take 10 mg by mouth daily.     loratadine (CLARITIN) 10 MG tablet Take 1 tablet (10 mg total) by mouth daily. 60 tablet 1   LORazepam (ATIVAN) 0.5 MG tablet Take 1 tablet (0.5 mg total) by mouth at bedtime as needed (Nausea or vomiting). (Patient not taking: Reported on 11/30/2019) 20 tablet 0   predniSONE (DELTASONE) 5 MG tablet 6 tab x 1 day, 5 tab x 1 day, 4 tab x 1 day, 3 tab x 1 day, 2 tab x 1 day, 1 tab x 1 day, stop 21 tablet 0   prochlorperazine (COMPAZINE) 10 MG tablet Take 1 tablet (10 mg total) by mouth every 6 (six) hours as needed (Nausea or vomiting). (Patient not taking: Reported on 11/30/2019) 30 tablet 1   No current facility-administered medications for this visit.   Facility-Administered Medications Ordered in Other Visits  Medication  Dose Route Frequency Provider Last Rate Last Admin   sodium chloride flush (NS) 0.9 % injection 10 mL  10 mL Intracatheter PRN Meshell Abdulaziz, Valentino Hue, MD   10 mL at 01/11/20 1539    OBJECTIVE: Spanish speaker who appears stated age  Vitals:   01/11/20 0904  BP: 121/87  Pulse: (!) 109  Resp: 18  Temp: 97.8 F (36.6 C)  SpO2: 100%   Wt Readings from Last 3 Encounters:  01/11/20 200 lb 12.8 oz (91.1 kg)  01/03/20 199 lb (90.3 kg)  12/21/19 202 lb 9.6 oz (91.9 kg)   Body mass index is 34.47 kg/m.    ECOG FS:1 - Symptomatic but completely ambulatory  Sclerae unicteric, EOMs intact Wearing a mask No cervical or supraclavicular adenopathy Lungs no rales or rhonchi Heart regular rate and rhythm Abd soft, nontender, positive bowel sounds MSK no focal spinal tenderness, no upper extremity lymphedema Neuro: nonfocal, well oriented, appropriate affect Breasts: I do not palpate a mass in the right breast.  There is minimal skin thickening.  There is no erythema.  The left breast is  benign.  Both axillae are benign.   Right breast 10/06/2019     LAB RESULTS:  CMP     Component Value Date/Time   NA 137 01/11/2020 0802   K 3.8 01/11/2020 0802   CL 103 01/11/2020 0802   CO2 24 01/11/2020 0802   GLUCOSE 117 (H) 01/11/2020 0802   BUN 8 01/11/2020 0802   CREATININE 0.78 01/11/2020 0802   CREATININE 0.79 10/06/2019 0826   CALCIUM 8.9 01/11/2020 0802   PROT 7.1 01/11/2020 0802   ALBUMIN 3.7 01/11/2020 0802   AST 66 (H) 01/11/2020 0802   AST 26 10/06/2019 0826   ALT 71 (H) 01/11/2020 0802   ALT 47 (H) 10/06/2019 0826   ALKPHOS 172 (H) 01/11/2020 0802   BILITOT 0.5 01/11/2020 0802   BILITOT 0.7 10/06/2019 0826   GFRNONAA >60 01/11/2020 0802   GFRNONAA >60 10/06/2019 0826   GFRAA >60 01/11/2020 0802   GFRAA >60 10/06/2019 0826    No results found for: TOTALPROTELP, ALBUMINELP, A1GS, A2GS, BETS, BETA2SER, GAMS, MSPIKE, SPEI  Lab Results  Component Value Date   WBC 7.1  01/11/2020   NEUTROABS 4.3 01/11/2020   HGB 7.8 (L) 01/11/2020   HCT 24.6 (L) 01/11/2020   MCV 97.6 01/11/2020   PLT 123 (L) 01/11/2020    No results found for: LABCA2  No components found for: LDJTTS177  No results for input(s): INR in the last 168 hours.  No results found for: LABCA2  No results found for: LTJ030  No results found for: SPQ330  No results found for: QTM226  No results found for: CA2729  No components found for: HGQUANT  No results found for: CEA1 / No results found for: CEA1   No results found for: AFPTUMOR  No results found for: CHROMOGRNA  No results found for: KPAFRELGTCHN, LAMBDASER, KAPLAMBRATIO (kappa/lambda light chains)  No results found for: HGBA, HGBA2QUANT, HGBFQUANT, HGBSQUAN (Hemoglobinopathy evaluation)   No results found for: LDH  No results found for: IRON, TIBC, IRONPCTSAT (Iron and TIBC)  No results found for: FERRITIN  Urinalysis No results found for: COLORURINE, APPEARANCEUR, LABSPEC, PHURINE, GLUCOSEU, HGBUR, BILIRUBINUR, KETONESUR, PROTEINUR, UROBILINOGEN, NITRITE, LEUKOCYTESUR   STUDIES: No results found.   ELIGIBLE FOR AVAILABLE RESEARCH PROTOCOL: no  ASSESSMENT: 44 y.o. [place] speaker status post right breast upper outer quadrant sites biopsy and right axillary lymph node biopsy 09/30/2019 for a clinical T3 N2, stage IIIA invasive ductal carcinoma, grade 3, estrogen and progesterone receptor negative, HER-2 amplified, with an MIB-1 of 80%.  (a) CT chest abdomen and pelvis and bone scan 10/15/2019 showed no evidence of metastatic disease  (1) genetics testing 10/13/2019 through the Common Hereditary Cancers Panel offered by [facility] found no deleterious mutations in APC, ATM, AXIN2, BARD1, BMPR1A, BRCA1, BRCA2, BRIP1, CDH1, CDKN2A (p14ARF), CDKN2A (p16INK4a), CKD4, CHEK2, CTNNA1, DICER1, EPCAM (Deletion/duplication testing only), GREM1 (promoter region deletion/duplication testing only), KIT, MEN1, MLH1,  MSH2, MSH3, MSH6, MUTYH, NBN, NF1, NHTL1, PALB2, PDGFRA, PMS2, POLD1, POLE, PTEN, RAD50, RAD51C, RAD51D, RNF43, SDHB, SDHC, SDHD, SMAD4, SMARCA4. STK11, TP53, TSC1, TSC2, and VHL.  The following genes were evaluated for sequence changes only: SDHA and HOXB13 c.251G>A variant only.  (2) neoadjuvant chemotherapy to consist of trastuzumab, pertuzumab, docetaxel and carboplatin every 21 days x 6 starting 10/19/2019  (3) trastuzumab and pertuzumab to be continued to total 1 year  (a) echo 10/15/2019 shows an ejection fraction in the 65-70% range.  (4) definitive surgery to follow  (5) adjuvant radiation.   PLAN: Sarae has  done remarkably well with her chemotherapy so far and she has had an excellent clinical response.  I am hoping for a very good radiologic and pathologic response once we get there.  She only has 1 more cycle to go.  She is now however significantly anemic and symptomatic.  We are going to go ahead and transfuse her 2 units of blood this week.  I have entered the orders and hopefully we can get that done tomorrow.  She will then see [place] again on the 24th for her final chemotherapy cycle.  She understands after that she will continue to receive trastuzumab and Pertuzumab for a total of a year.  I am going ahead and scheduling her breast MRI for the first week in September.  She will follow-up with her surgeon right after that.  Total encounter time 25 minutes.Alice Del, MD 01/11/20 3:53 PM Medical Oncology and Hematology [facility] [place], [place] [address] Tel. [phone]    Fax. [phone]   I, Wilburn Mylar, am acting as scribe for Dr. Virgie Dad. Alice [name].  I, Alice Del MD, have reviewed the above documentation for accuracy and completeness, and I agree with the above.    *Total Encounter Time as defined by the Centers for Medicare and Medicaid Services includes, in addition to the face-to-face time of a patient  visit (documented in the note above) non-face-to-face time: obtaining and reviewing outside history, ordering and reviewing medications, tests or procedures, care coordination (communications with other health care professionals or caregivers) and documentation in the medical record.

## 2020-01-11 ENCOUNTER — Other Ambulatory Visit: Payer: Self-pay

## 2020-01-11 ENCOUNTER — Inpatient Hospital Stay: Payer: Medicaid Other

## 2020-01-11 ENCOUNTER — Encounter: Payer: Self-pay | Admitting: *Deleted

## 2020-01-11 ENCOUNTER — Inpatient Hospital Stay: Payer: Medicaid Other | Attending: Oncology

## 2020-01-11 ENCOUNTER — Other Ambulatory Visit: Payer: Self-pay | Admitting: *Deleted

## 2020-01-11 ENCOUNTER — Inpatient Hospital Stay (HOSPITAL_BASED_OUTPATIENT_CLINIC_OR_DEPARTMENT_OTHER): Payer: Medicaid Other | Admitting: Oncology

## 2020-01-11 ENCOUNTER — Telehealth: Payer: Self-pay | Admitting: *Deleted

## 2020-01-11 VITALS — BP 119/77 | HR 87 | Resp 16

## 2020-01-11 VITALS — BP 121/87 | HR 109 | Temp 97.8°F | Resp 18 | Ht 64.0 in | Wt 200.8 lb

## 2020-01-11 DIAGNOSIS — Z5112 Encounter for antineoplastic immunotherapy: Secondary | ICD-10-CM | POA: Insufficient documentation

## 2020-01-11 DIAGNOSIS — D6489 Other specified anemias: Secondary | ICD-10-CM

## 2020-01-11 DIAGNOSIS — Z79899 Other long term (current) drug therapy: Secondary | ICD-10-CM | POA: Insufficient documentation

## 2020-01-11 DIAGNOSIS — D6481 Anemia due to antineoplastic chemotherapy: Secondary | ICD-10-CM | POA: Insufficient documentation

## 2020-01-11 DIAGNOSIS — Z5111 Encounter for antineoplastic chemotherapy: Secondary | ICD-10-CM | POA: Insufficient documentation

## 2020-01-11 DIAGNOSIS — Z171 Estrogen receptor negative status [ER-]: Secondary | ICD-10-CM | POA: Insufficient documentation

## 2020-01-11 DIAGNOSIS — C773 Secondary and unspecified malignant neoplasm of axilla and upper limb lymph nodes: Secondary | ICD-10-CM | POA: Insufficient documentation

## 2020-01-11 DIAGNOSIS — C50411 Malignant neoplasm of upper-outer quadrant of right female breast: Secondary | ICD-10-CM | POA: Diagnosis not present

## 2020-01-11 DIAGNOSIS — Z170 Estrogen receptor positive status [ER+]: Secondary | ICD-10-CM

## 2020-01-11 DIAGNOSIS — Z95828 Presence of other vascular implants and grafts: Secondary | ICD-10-CM

## 2020-01-11 LAB — CBC WITH DIFFERENTIAL/PLATELET
Abs Immature Granulocytes: 0.03 10*3/uL (ref 0.00–0.07)
Basophils Absolute: 0 10*3/uL (ref 0.0–0.1)
Basophils Relative: 0 %
Eosinophils Absolute: 0 10*3/uL (ref 0.0–0.5)
Eosinophils Relative: 0 %
HCT: 24.6 % — ABNORMAL LOW (ref 36.0–46.0)
Hemoglobin: 7.8 g/dL — ABNORMAL LOW (ref 12.0–15.0)
Immature Granulocytes: 0 %
Lymphocytes Relative: 30 %
Lymphs Abs: 2.1 10*3/uL (ref 0.7–4.0)
MCH: 31 pg (ref 26.0–34.0)
MCHC: 31.7 g/dL (ref 30.0–36.0)
MCV: 97.6 fL (ref 80.0–100.0)
Monocytes Absolute: 0.6 10*3/uL (ref 0.1–1.0)
Monocytes Relative: 8 %
Neutro Abs: 4.3 10*3/uL (ref 1.7–7.7)
Neutrophils Relative %: 62 %
Platelets: 123 10*3/uL — ABNORMAL LOW (ref 150–400)
RBC: 2.52 MIL/uL — ABNORMAL LOW (ref 3.87–5.11)
RDW: 19.3 % — ABNORMAL HIGH (ref 11.5–15.5)
WBC: 7.1 10*3/uL (ref 4.0–10.5)
nRBC: 0.3 % — ABNORMAL HIGH (ref 0.0–0.2)

## 2020-01-11 LAB — COMPREHENSIVE METABOLIC PANEL
ALT: 71 U/L — ABNORMAL HIGH (ref 0–44)
AST: 66 U/L — ABNORMAL HIGH (ref 15–41)
Albumin: 3.7 g/dL (ref 3.5–5.0)
Alkaline Phosphatase: 172 U/L — ABNORMAL HIGH (ref 38–126)
Anion gap: 10 (ref 5–15)
BUN: 8 mg/dL (ref 6–20)
CO2: 24 mmol/L (ref 22–32)
Calcium: 8.9 mg/dL (ref 8.9–10.3)
Chloride: 103 mmol/L (ref 98–111)
Creatinine, Ser: 0.78 mg/dL (ref 0.44–1.00)
GFR calc Af Amer: >60 mL/min (ref >60)
GFR calc non Af Amer: >60 mL/min (ref >60)
Glucose, Bld: 117 mg/dL — ABNORMAL HIGH (ref 70–99)
Potassium: 3.8 mmol/L (ref 3.5–5.1)
Sodium: 137 mmol/L (ref 135–145)
Total Bilirubin: 0.5 mg/dL (ref 0.3–1.2)
Total Protein: 7.1 g/dL (ref 6.5–8.1)

## 2020-01-11 LAB — ABO/RH: ABO/RH(D): O POS

## 2020-01-11 LAB — PREGNANCY, URINE: Preg Test, Ur: NEGATIVE

## 2020-01-11 MED ORDER — DIPHENHYDRAMINE HCL 25 MG PO CAPS
25.0000 mg | ORAL_CAPSULE | Freq: Once | ORAL | Status: AC
  Administered 2020-01-11: 25 mg via ORAL

## 2020-01-11 MED ORDER — SODIUM CHLORIDE 0.9% FLUSH
10.0000 mL | INTRAVENOUS | Status: DC | PRN
  Administered 2020-01-11: 10 mL
  Filled 2020-01-11: qty 10

## 2020-01-11 MED ORDER — SODIUM CHLORIDE 0.9 % IV SOLN
420.0000 mg | Freq: Once | INTRAVENOUS | Status: AC
  Administered 2020-01-11: 420 mg via INTRAVENOUS
  Filled 2020-01-11: qty 14

## 2020-01-11 MED ORDER — PALONOSETRON HCL INJECTION 0.25 MG/5ML
0.2500 mg | Freq: Once | INTRAVENOUS | Status: AC
  Administered 2020-01-11: 0.25 mg via INTRAVENOUS

## 2020-01-11 MED ORDER — ACETAMINOPHEN 325 MG PO TABS
ORAL_TABLET | ORAL | Status: AC
  Filled 2020-01-11: qty 2

## 2020-01-11 MED ORDER — SODIUM CHLORIDE 0.9 % IV SOLN
75.0000 mg/m2 | Freq: Once | INTRAVENOUS | Status: AC
  Administered 2020-01-11: 150 mg via INTRAVENOUS
  Filled 2020-01-11: qty 15

## 2020-01-11 MED ORDER — SODIUM CHLORIDE 0.9 % IV SOLN
Freq: Once | INTRAVENOUS | Status: AC
  Filled 2020-01-11: qty 250

## 2020-01-11 MED ORDER — PALONOSETRON HCL INJECTION 0.25 MG/5ML
INTRAVENOUS | Status: AC
  Filled 2020-01-11: qty 5

## 2020-01-11 MED ORDER — SODIUM CHLORIDE 0.9 % IV SOLN
150.0000 mg | Freq: Once | INTRAVENOUS | Status: AC
  Administered 2020-01-11: 150 mg via INTRAVENOUS
  Filled 2020-01-11: qty 150

## 2020-01-11 MED ORDER — HEPARIN SOD (PORK) LOCK FLUSH 100 UNIT/ML IV SOLN
500.0000 [IU] | Freq: Once | INTRAVENOUS | Status: AC | PRN
  Administered 2020-01-11: 500 [IU]
  Filled 2020-01-11: qty 5

## 2020-01-11 MED ORDER — DIPHENHYDRAMINE HCL 25 MG PO CAPS
ORAL_CAPSULE | ORAL | Status: AC
  Filled 2020-01-11: qty 1

## 2020-01-11 MED ORDER — ACETAMINOPHEN 325 MG PO TABS
650.0000 mg | ORAL_TABLET | Freq: Once | ORAL | Status: AC
  Administered 2020-01-11: 650 mg via ORAL

## 2020-01-11 MED ORDER — SODIUM CHLORIDE 0.9 % IV SOLN
750.0000 mg | Freq: Once | INTRAVENOUS | Status: AC
  Administered 2020-01-11: 750 mg via INTRAVENOUS
  Filled 2020-01-11: qty 75

## 2020-01-11 MED ORDER — TRASTUZUMAB-DKST CHEMO 150 MG IV SOLR
600.0000 mg | Freq: Once | INTRAVENOUS | Status: AC
  Administered 2020-01-11: 600 mg via INTRAVENOUS
  Filled 2020-01-11: qty 28.57

## 2020-01-11 MED ORDER — SODIUM CHLORIDE 0.9 % IV SOLN
10.0000 mg | Freq: Once | INTRAVENOUS | Status: AC
  Administered 2020-01-11: 10 mg via INTRAVENOUS
  Filled 2020-01-11: qty 10

## 2020-01-11 NOTE — Progress Notes (Signed)
Blood drawn in lab out of arm. Accessed and flushed port only

## 2020-01-11 NOTE — Progress Notes (Signed)
HR today 108-111, ok to treat per Dr. Jana Hakim.

## 2020-01-11 NOTE — Patient Instructions (Signed)
[facility] Discharge Instructions for Patients Receiving Chemotherapy  Today you received the following chemotherapy agents Trastuzumab; Pertuzumab; Carboplatin; Docetaxel  To help prevent nausea and vomiting after your treatment, we encourage you to take your nausea medication as directed   If you develop nausea and vomiting that is not controlled by your nausea medication, call the clinic.   BELOW ARE SYMPTOMS THAT SHOULD BE REPORTED IMMEDIATELY:  *FEVER GREATER THAN 100.5 F  *CHILLS WITH OR WITHOUT FEVER  NAUSEA AND VOMITING THAT IS NOT CONTROLLED WITH YOUR NAUSEA MEDICATION  *UNUSUAL SHORTNESS OF BREATH  *UNUSUAL BRUISING OR BLEEDING  TENDERNESS IN MOUTH AND THROAT WITH OR WITHOUT PRESENCE OF ULCERS  *URINARY PROBLEMS  *BOWEL PROBLEMS  UNUSUAL RASH Items with * indicate a potential emergency and should be followed up as soon as possible.  Feel free to call the clinic should you have any questions or concerns. The clinic phone number is [phone].  Please show the CHEMO ALERT CARD at check-in to the Emergency Department and triage nurse.  Trastuzumab; Hyaluronidase injection Qu es este medicamento? TRASTUZUMAB; HIALURONIDASA se usa para tratar el cncer de mama y el cncer de estmago. El trastuzumab es un anticuerpo monoclonal. La hialuronidasa se usa para mejorar los efectos del trastuzumab. Este medicamento puede ser utilizado para otros usos; si tiene alguna pregunta consulte con su proveedor de atencin mdica o con su farmacutico. MARCAS COMUNES: HERCEPTIN HYLECTA Qu le debo informar a mi profesional de la salud antes de tomar este medicamento? Necesitan saber si usted presenta alguno de los siguientes problemas o situaciones: enfermedad cardiaca insuficiencia cardiaca enfermedad pulmonar o respiratoria, como asma una reaccin alrgica o inusual al trastuzumab, a otros medicamentos, alimentos, colorantes o conservantes si est embarazada o  buscando quedar embarazada si est amamantando a un beb Cmo debo utilizar este medicamento? Este medicamento se administra mediante una inyeccin por va subcutnea. Lo administra un profesional de la salud en un hospital o en un entorno clnico. Hable con su pediatra para informarse acerca del uso de este medicamento en nios. Este medicamento no est aprobado para uso en nios. Sobredosis: Pngase en contacto inmediatamente con un centro toxicolgico o una sala de urgencia si usted cree que haya tomado demasiado medicamento. ATENCIN: Este medicamento es solo para usted. No comparta este medicamento con nadie. Qu sucede si me olvido de una dosis? Es importante no olvidar ninguna dosis. Informe a su mdico o a su profesional de la salud si no puede asistir a una cita. Qu puede interactuar con este medicamento? Este medicamento podra interactuar con los siguientes frmacos: ciertos tipos de quimioterapia, tales como daunorubicina, doxorrubicina, epirubicina, e idarubicina Puede ser que esta lista no menciona todas las posibles interacciones. Informe a su profesional de la salud de todos los productos a base de hierbas, medicamentos de venta libre o suplementos nutritivos que est tomando. Si usted fuma, consume bebidas alcohlicas o si utiliza drogas ilegales, indqueselo tambin a su profesional de la salud. Algunas sustancias pueden interactuar con su medicamento. A qu debo estar atento al usar este medicamento? Visite a su mdico para que revise su evolucin. Si presenta algn efecto secundario, infrmelo. Contine con el tratamiento aun si se siente enfermo, a menos que su mdico le indique que lo suspenda. Consulte a su mdico o a su profesional de la salud si tiene fiebre, escalofros o dolor de garganta, o cualquier otro sntoma de resfro o gripe. No se trate usted mismo. Trate de no acercarse a personas que estn   enfermas. Es posible que tenga fiebre, escalofros y temblores durante  su primera infusin. Estos efectos generalmente son leves y pueden tratarse con otros medicamentos. Informe cualquier efecto secundario durante la infusin a su profesional de la salud. La fiebre y los escalofros por lo general no suceden con las infusiones posteriores. No debe quedar embarazada mientras est usando este medicamento o por 7 meses despus de dejar de usarlo. Las mujeres deben informar a su mdico si estn buscando quedar embarazadas o si creen que podran estar embarazadas. Las mujeres con la posibilidad de tener nios deben tener una prueba de embarazo negativa antes de empezar a tomar este medicamento. Existe la posibilidad de efectos secundarios graves en un beb sin nacer. Para obtener ms informacin, hable con su profesional de la salud o su farmacutico. No debe amamantar a un beb mientras est tomando este medicamento o por 7 meses despus de dejar de usarlo. Qu efectos secundarios puedo tener al utilizar este medicamento? Efectos secundarios que debe informar a su mdico o a su profesional de la salud tan pronto como sea posible: reacciones alrgicas, como erupcin cutnea, comezn/picazn o urticaria, e hinchazn de la cara, los labios o la lengua problemas respiratorios dolor en el pecho o palpitaciones tos fiebre sensacin general de estar enfermo o sntomas gripales signos de empeoramiento de la insuficiencia cardiaca, tales como problemas respiratorios; hinchazn en las piernas y los pies Efectos secundarios que generalmente no requieren atencin mdica (debe informarlos a su mdico o a su profesional de la salud si persisten o si son molestos): dolor de huesos cambios en el sentido del gusto diarrea dolor en las articulaciones nuseas, vmito cansancio o debilidad inusual prdida de peso Puede ser que esta lista no menciona todos los posibles efectos secundarios. Comunquese a su mdico por asesoramiento mdico sobre los efectos secundarios. Usted puede informar los efectos  secundarios a la FDA por telfono al 1-800-FDA-1088. Dnde debo guardar mi medicina? Este medicamento se administra en hospitales o clnicas, y no necesitar guardarlo en su domicilio. ATENCIN: Este folleto es un resumen. Puede ser que no cubra toda la posible informacin. Si usted tiene preguntas acerca de esta medicina, consulte con su mdico, su farmacutico o su profesional de la salud.  2020 Elsevier/Gold Standard (2018-04-06 00:00:00)  Pertuzumab injection Qu es este medicamento? El PERTUZUMAB es un anticuerpo monoclonal. Se utiliza para tratar el cncer de mama. Este medicamento puede ser utilizado para otros usos; si tiene alguna pregunta consulte con su proveedor de atencin mdica o con su farmacutico. MARCAS COMUNES: PERJETA Qu le debo informar a mi profesional de la salud antes de tomar este medicamento? Necesita saber si usted presenta alguno de los siguientes problemas o situaciones: enfermedad cardiaca insuficiencia cardiaca alta presin sangunea antecedentes de pulso cardiaca irregular radioterapia reciente o continuada una reaccin alrgica o inusual al pertuzumab, a otros medicamentos, alimentos, colorantes o conservantes si est embarazada o buscando quedar embarazada si est amamantando a un beb Cmo debo utilizar este medicamento? Este medicamento se administra mediante infusin por va intravenosa. Lo administra un profesional de la salud en un hospital o en un entorno clnico. Hable con su pediatra para informarse acerca del uso de este medicamento en nios. Puede requerir atencin especial. Sobredosis: Pngase en contacto inmediatamente con un centro toxicolgico o una sala de urgencia si usted cree que haya tomado demasiado medicamento. ATENCIN: Este medicamento es solo para usted. No comparta este medicamento con nadie. Qu sucede si me olvido de una dosis? Es importante no olvidar ninguna dosis.   Informe a su mdico o a su profesional de la salud si no puede  asistir a una cita. Qu puede interactuar con este medicamento? No se esperan interacciones. Puede ser que esta lista no menciona todas las posibles interacciones. Informe a su profesional de la salud de todos los productos a base de hierbas, medicamentos de venta libre o suplementos nutritivos que est tomando. Si usted fuma, consume bebidas alcohlicas o si utiliza drogas ilegales, indqueselo tambin a su profesional de la salud. Algunas sustancias pueden interactuar con su medicamento. A qu debo estar atento al usar este medicamento? Se supervisar su estado de salud atentamente mientras reciba este medicamento. Si presenta algn efecto secundario, infrmelo. Sin embargo, contine con el tratamiento aun si se siente enfermo, a menos que su mdico le indique que lo suspenda. No debe quedar embarazada mientras est tomando este medicamento o por 7 meses despus de dejar de usarlo. Las mujeres deben informar a su mdico si estn buscando quedar embarazadas o si creen que estn embarazadas. Las mujeres con la posibilidad de tener nios deben tener una prueba de embarazo negativa antes de empezar a tomar este medicamento. Existe la posibilidad de que ocurran efectos secundarios graves a un beb sin nacer. Para ms informacin hable con su profesional de la salud o su farmacutico. No debe amamantar a un beb mientras est tomando este medicamento o por 7 meses despus de dejar de usarlo. Las mujeres deben usar un mtodo anticonceptivo eficaz con este medicamento. Consulte a su mdico o a su profesional de la salud si tiene fiebre, escalofros, dolor de garganta, o cualquier otro sntoma de resfro o gripe. No se trate usted mismo. Trate de no acercarse a personas que estn enfermas. Es posible que tenga fiebre, escalofros y dolor de cabeza durante la infusin. Informe cualquier efecto secundario durante la infusin a su profesional de la salud. Qu efectos secundarios puedo tener al utilizar este  medicamento? Efectos secundarios que debe informar a su mdico o a su profesional de la salud tan pronto como sea posible: problemas respiratorios dolor en el pecho o palpitaciones mareos sensacin de desmayos o aturdimiento fiebre o escalofros erupcin cutnea, picazn o urticaria dolor de garganta hinchazn de la cara, labios o lengua hinchazn de piernas o tobillos cansancio o debilidad inusual Efectos secundarios que generalmente no requieren atencin mdica (infrmelos a su mdico o a su profesional de la salud si persisten o si son molestos): diarrea cada del cabello nuseas, vmito cansancio Puede ser que esta lista no menciona todos los posibles efectos secundarios. Comunquese a su mdico por asesoramiento mdico sobre los efectos secundarios. Usted puede informar los efectos secundarios a la FDA por telfono al 1-800-FDA-1088. Dnde debo guardar mi medicina? Este medicamento se administra en hospitales o clnicas y no necesitar guardarlo en su domicilio. ATENCIN: Este folleto es un resumen. Puede ser que no cubra toda la posible informacin. Si usted tiene preguntas acerca de esta medicina, consulte con su mdico, su farmacutico o su profesional de la salud.  2020 Elsevier/Gold Standard (2016-06-27 00:00:00)  Carboplatin injection Qu es este medicamento? El CARBOPLATINO es un agente quimioteraputico. Este medicamento acta sobre las clulas que se dividen rpidamente, como las clulas cancergenas, y finalmente provoca la muerte de estas clulas. Se utiliza en el tratamiento del cncer de ovario y muchos otros tipos de cncer. Este medicamento puede ser utilizado para otros usos; si tiene alguna pregunta consulte con su proveedor de atencin mdica o con su farmacutico. MARCAS COMUNES: Paraplatin Qu le debo informar   a mi profesional de la salud antes de tomar este medicamento? Necesita saber si usted presenta alguno de los siguientes problemas o situaciones:  trastornos  sanguneos  problemas auditivos  enfermedad renal  radioterapia reciente o continuada  una reaccin alrgica o inusual al carboplatino, al cisplatino, a otros agentes quimioteraputicos, a otros medicamentos, alimentos, colorantes o conservantes  si est embarazada o buscando quedar embarazada  si est amamantando a un beb Cmo debo utilizar este medicamento? Este medicamento se administra normalmente mediante infusin por va intravenosa. Lo administra un profesional de la salud calificado en un hospital o en un entorno clnico. Hable con su pediatra para informarse acerca del uso de este medicamento en nios. Puede requerir atencin especial. Sobredosis: Pngase en contacto inmediatamente con un centro toxicolgico o una sala de urgencia si usted cree que haya tomado demasiado medicamento. ATENCIN: Este medicamento es solo para usted. No comparta este medicamento con nadie. Qu sucede si me olvido de una dosis? Es importante no olvidar ninguna dosis. Informe a su mdico o a su profesional de la salud si no puede asistir a una cita. Qu puede interactuar con este medicamento?  medicamentos para convulsiones  medicamentos para incrementar los conteos sanguneos, tales como filgrastim, pegfilgrastim, sargramostim  ciertos antibiticos, tales como amicacina, gentamicina, neomicina, estreptomicina, tobramicina  vacunas Consulte a su mdico o a su profesional de la salud antes de tomar cualquiera de los siguientes medicamentos:  acetaminofeno  aspirina  ibuprofeno  quetoprofeno  naproxeno Puede ser que esta lista no menciona todas las posibles interacciones. Informe a su profesional de la salud de todos los productos a base de hierbas, medicamentos de venta libre o suplementos nutritivos que est tomando. Si usted fuma, consume bebidas alcohlicas o si utiliza drogas ilegales, indqueselo tambin a su profesional de la salud. Algunas sustancias pueden interactuar con su  medicamento. A qu debo estar atento al usar este medicamento? Se supervisar su estado de salud atentamente mientras reciba este medicamento. Tendr que hacerse anlisis de sangre peridicos mientras est tomando este medicamento. Este medicamento puede hacerle sentir un malestar general. Esto es normal ya que la quimioterapia afecta tanto a las clulas sanas como a las clulas cancerosas. Si presenta alguno de los efectos secundarios, infrmelos. Sin embargo, contine con el tratamiento aun si se siente enfermo, a menos que su mdico le indique que lo suspenda. En algunos casos, podr recibir medicamentos adicionales para ayudarle con los efectos secundarios. Siga las instrucciones para usarlos. Consulte a su mdico o a su profesional de la salud por asesoramiento si tiene fiebre, escalofros, dolor de garganta o cualquier otro sntoma de resfro o gripe. No se trate usted mismo. Este medicamento puede reducir la capacidad del cuerpo para combatir infecciones. Trate de no acercarse a personas que estn enfermas. Este medicamento puede aumentar el riesgo de magulladuras o sangrado. Consulte a su mdico o a su profesional de la salud si observa sangrados inusuales. Proceda con cuidado al cepillar sus dientes, usar hilo dental o utilizar palillos para los dientes, ya que puede contraer una infeccin o sangrar con mayor facilidad. Si se somete a algn tratamiento dental, informe a su dentista que est usando este medicamento. Evite tomar productos que contienen aspirina, acetaminofeno, ibuprofeno, naproxeno o quetoprofeno a menos que as lo indique su mdico. Estos productos pueden disimular la fiebre. No se debe quedar embarazada mientras recibe este medicamento. Las mujeres deben informar a su mdico si estn buscando quedar embarazadas o si creen que estn embarazadas. Existe la posibilidad de efectos secundarios   graves a un beb sin nacer. Para ms informacin hable con su profesional de la salud o su  farmacutico. No debe Economist a un beb mientras est usando este medicamento. Qu efectos secundarios puedo tener al [employer] este medicamento? Efectos secundarios que debe informar a su mdico o a Barrister's clerk de la salud tan pronto como sea posible:  Chief of Staff como erupcin cutnea, picazn o urticarias, hinchazn de la cara, labios o lengua  signos de infeccin - fiebre o escalofros, tos, dolor de garganta, dolor o dificultad para orinar  signos de reduccin de plaquetas o sangrado - magulladuras, puntos rojos en la piel, heces de color oscuro o con aspecto alquitranado, sangrando por la nariz  signos de reduccin de glbulos rojos - cansancio o debilidad inusual, desmayos, sensacin de [employer]  problemas respiratorios  cambios de audicin  cambios en la visin  dolor en el pecho  alta presin sangunea  conteos sanguneos bajos - Este medicamento puede reducir la cantidad de glbulos blancos, glbulos rojos y plaquetas. Su riesgo de infeccin y [place].  nuseas, vmito  dolor, enrojecimiento, hinchazn o irritacin en el lugar de la inyeccin  dolor, hormigueo, entumecimiento de manos o pies  problemas de coordinacin, del habla, al caminar  dificultad para orinar o cambios en el volumen de orina Efectos secundarios que, por lo general, no requieren atencin mdica (debe informarlos a su mdico o a su profesional de la salud si persisten o si son molestos):  cada del cabello  prdida del apetito  sabor metlico o cambios en el sentido del gusto Puede ser que esta lista no menciona todos los posibles efectos secundarios. Comunquese a su mdico por asesoramiento mdico [employer] Usted puede informar los efectos secundarios a la FDA por telfono al 1-800-FDA-1088. Dnde debo guardar mi medicina? Este medicamento se administra en hospitales o clnicas y no necesitar guardarlo en su domicilio. ATENCIN: Este folleto  es un resumen. Puede ser que no cubra toda la posible informacin. Si usted tiene preguntas acerca de esta medicina, consulte con su mdico, su farmacutico o su profesional de Technical sales engineer.  2020 Elsevier/Gold Standard (2014-07-19 00:00:00)  Docetaxel injection Qu es este medicamento? El DOCETAXEL es un agente quimioteraputico. Este medicamento acta sobre las clulas que se dividen rpidamente, como las clulas cancergenas, y finalmente provoca la muerte de estas clulas. Se utiliza en el tratamiento de muchos tipos de cncer como el cncer de mama, adenocarcinoma gstrico, cabeza y cuello, pulmn y prstata. Este medicamento puede ser utilizado para otros usos; si tiene alguna pregunta consulte con su proveedor de atencin mdica o con su farmacutico. MARCAS COMUNES: Docefrez, Taxotere Qu le debo informar a mi profesional de la salud antes de tomar este medicamento? Necesita saber si usted presenta alguno de los siguientes problemas o situaciones:  infeccin (especialmente infecciones virales, como varicela o herpes)  enfermedad heptica  conteos sanguneos bajos, como baja cantidad de glbulos blancos, glbulos rojos y plaquetas  una reaccin alrgica o inusual al docetaxel, polisorbato 80, a otros agentes quimioteraputicos, a otros medicamentos, alimentos, colorantes o conservantes  si est embarazada o buscando quedar embarazada  si est amamantando a un beb Cmo debo utilizar este medicamento? Este medicamento se administra como infusin en una vena. Un profesional de la salud especialmente capacitado lo administra en un hospital o clnica. Hable con su pediatra para informarse acerca del uso de este medicamento en nios. Puede requerir atencin especial. Sobredosis: Pngase en contacto inmediatamente con un centro toxicolgico o  una sala de urgencia si usted cree que haya tomado demasiado medicamento. ATENCIN: [employer] es solo para usted. No comparta este medicamento  con nadie. Qu sucede si me olvido de una dosis? Es importante no olvidar ninguna dosis. Informe a su mdico o a su profesional de la salud si no puede asistir a Photographer. Qu puede interactuar con este medicamento? aprepitant ciertos antibiticos, tales como eritromicina o claritromicina ciertos medicamentos antivirales para VIH o hepatitis ciertos medicamentos para infecciones micticas, tales como fluconazol, itraconazol, ketoconazol, posaconazol o voriconazol cimetidina ciprofloxacino conivaptn ciclosporina dronedarona fluvoxamina jugo de toronja (pomelo) imatinib verapamilo Puede ser que esta lista no menciona todas las posibles interacciones. Informe a su profesional de [employer]	[place] de [employer] productos a base de hierbas, medicamentos de [place] o suplementos nutritivos que est tomando. Si usted fuma, consume bebidas alcohlicas o si utiliza drogas ilegales, indqueselo tambin a su profesional de [employer]	[place]. Algunas sustancias pueden interactuar con su medicamento. A qu debo estar atento al usar [employer]? Se supervisar su estado de salud atentamente mientras reciba este medicamento. Tendr que hacerse anlisis de sangre importantes mientras est usando este medicamento. Llame a su mdico o a su profesional de la salud si tiene fiebre, escalofros o dolor de garganta, o cualquier otro sntoma de resfro o gripe. No se trate usted mismo. Este medicamento reduce la capacidad del cuerpo para combatir infecciones. Trate de no acercarse a personas que estn enfermas. Algunos productos pueden contener alcohol. Pregunte a su profesional de la salud si este medicamento contiene alcohol. Asegrese de informar a todos los profesionales de la salud que usted est usando [place]. Ciertos medicamentos, como metronidazol y disulfirm, pueden causar una reaccin desagradable cuando se usan con alcohol. Esta reaccin incluye enrojecimiento, dolor de cabeza, nuseas, vmitos, sudoracin y  aumento de la sed. La reaccin puede durar de 30 minutos a varias horas. Puede experimentar somnolencia o mareos. No conduzca, no utilice maquinaria ni haga nada que Associate Professor en estado de alerta hasta que sepa cmo le afecta este medicamento. No se siente ni se ponga de pie con rapidez, especialmente si es un paciente de edad avanzada. Esto reduce el riesgo de mareos o [employer]. El alcohol puede interferir con el efecto de este medicamento. Hable con su profesional de la salud sobre su riesgo de Hotel manager. Usted puede tener mayor riesgo para ciertos tipos de cncer si [place] este medicamento. No debe quedar embarazada mientras est usando este medicamento o por 6 meses despus de dejar de usarlo. Las mujeres deben informar a su mdico si estn buscando quedar embarazadas o si creen que podran estar embarazadas. Existe la posibilidad deEfectos secundarios graves en un beb sin nacer. Para obtener ms informacin, hable con su profesional de la salud o su farmacutico. No debe Economist a un beb mientras est usando este medicamento o por 1 semana despus de dejar de usarlo. Los hombres que reciben este medicamento deben usar un condn durante las relaciones sexuales con mujeres que puedan quedar embarazadas. Si usted embaraza a [employer], el beb podra tener defectos de nacimiento. El beb podra morir antes de Associate Professor. Deber seguir usando condn durante 3 meses despus de suspender el medicamento. Informe a su proveedor de Geophysical data processor de inmediato si su pareja queda embarazada mientras usted est [employer]. Esto puede interferir con la capacidad de los hombres para Social worker a Musician. Usted debe hablar con su mdico o su profesional de la salud si est preocupado por su  fertilidad. Qu efectos secundarios puedo tener al utilizar este medicamento? Efectos secundarios que debe informar a su mdico o a su profesional de la salud tan pronto como sea posible: reacciones alrgicas,  como erupcin cutnea, comezn/picazn o urticaria, e hinchazn de la cara, los labios o la lengua visin borrosa problemas para respirar cambios en la visin conteos sanguneos bajos: este frmaco podra reducir la cantidad de glbulos blancos, glbulos rojos y plaquetas. Su riesgo de infeccin y sangrado podra ser mayor. nuseas y vmito dolor, enrojecimiento o irritacin en el lugar de la inyeccin dolor, hormigueo o entumecimiento de las manos o los pies enrojecimiento, formacin de ampollas, descamacin o distensin de la piel, incluso dentro de la boca signos de disminucin en la cantidad de plaquetas o sangrado: moretones, puntos rojos en la piel, heces de color negro y aspecto alquitranado, sangrado por la nariz signos de disminucin en la cantidad de glbulos rojos: cansancio o debilidad inusual, desmayos, aturdimiento signos de infeccin: fiebre o escalofros, tos, dolor de garganta, dolor o dificultad para orinar hinchazn de tobillos, pies, manos Efectos secundarios que generalmente no requieren atencin mdica (infrmelos a su mdico o a su profesional de la salud si persisten o si son molestos): estreimiento diarrea cambios en las uas de las manos o de los pies cada del cabello prdida del apetito llagas en la boca dolor muscular Puede ser que esta lista no menciona todos los posibles efectos secundarios. Comunquese a su mdico por asesoramiento mdico sobre los efectos secundarios. Usted puede informar los efectos secundarios a la FDA por telfono al 1-800-FDA-1088. Dnde debo guardar mi medicina? Este medicamento se administra en hospitales o clnicas y no necesitar guardarlo en su domicilio. ATENCIN: Este folleto es un resumen. Puede ser que no cubra toda la posible informacin. Si usted tiene preguntas acerca de esta medicina, consulte con su mdico, su farmacutico o su profesional de la salud.  2020 Elsevier/Gold Standard (2019-03-31 00:00:00)    

## 2020-01-11 NOTE — Telephone Encounter (Signed)
Ok to treat with heme of 7.8- pt to be set up for transfusion this week for 2 units PRBC's.  Ok to treat per elevated heart rate of 111.  Treatment room nurse to draw Alice [name] from port.

## 2020-01-12 ENCOUNTER — Other Ambulatory Visit: Payer: Self-pay

## 2020-01-12 ENCOUNTER — Inpatient Hospital Stay: Payer: Medicaid Other

## 2020-01-12 ENCOUNTER — Encounter: Payer: Self-pay | Admitting: *Deleted

## 2020-01-12 DIAGNOSIS — D6481 Anemia due to antineoplastic chemotherapy: Secondary | ICD-10-CM | POA: Diagnosis not present

## 2020-01-12 DIAGNOSIS — C50411 Malignant neoplasm of upper-outer quadrant of right female breast: Secondary | ICD-10-CM | POA: Diagnosis not present

## 2020-01-12 DIAGNOSIS — Z79899 Other long term (current) drug therapy: Secondary | ICD-10-CM | POA: Diagnosis not present

## 2020-01-12 DIAGNOSIS — Z171 Estrogen receptor negative status [ER-]: Secondary | ICD-10-CM | POA: Diagnosis not present

## 2020-01-12 DIAGNOSIS — C773 Secondary and unspecified malignant neoplasm of axilla and upper limb lymph nodes: Secondary | ICD-10-CM | POA: Diagnosis not present

## 2020-01-12 DIAGNOSIS — Z5112 Encounter for antineoplastic immunotherapy: Secondary | ICD-10-CM | POA: Diagnosis not present

## 2020-01-12 DIAGNOSIS — D6489 Other specified anemias: Secondary | ICD-10-CM

## 2020-01-12 DIAGNOSIS — Z5111 Encounter for antineoplastic chemotherapy: Secondary | ICD-10-CM | POA: Diagnosis not present

## 2020-01-12 MED ORDER — ACETAMINOPHEN 325 MG PO TABS
650.0000 mg | ORAL_TABLET | Freq: Once | ORAL | Status: AC
  Administered 2020-01-12: 650 mg via ORAL

## 2020-01-12 MED ORDER — DIPHENHYDRAMINE HCL 25 MG PO CAPS
25.0000 mg | ORAL_CAPSULE | Freq: Once | ORAL | Status: AC
  Administered 2020-01-12: 25 mg via ORAL

## 2020-01-12 MED ORDER — SODIUM CHLORIDE 0.9% IV SOLUTION
250.0000 mL | Freq: Once | INTRAVENOUS | Status: AC
  Administered 2020-01-12: 250 mL via INTRAVENOUS
  Filled 2020-01-12: qty 250

## 2020-01-12 MED ORDER — DIPHENHYDRAMINE HCL 25 MG PO CAPS
ORAL_CAPSULE | ORAL | Status: AC
  Filled 2020-01-12: qty 2

## 2020-01-12 MED ORDER — HEPARIN SOD (PORK) LOCK FLUSH 100 UNIT/ML IV SOLN
500.0000 [IU] | Freq: Every day | INTRAVENOUS | Status: AC | PRN
  Administered 2020-01-12: 500 [IU]
  Filled 2020-01-12: qty 5

## 2020-01-12 MED ORDER — SODIUM CHLORIDE 0.9% FLUSH
10.0000 mL | INTRAVENOUS | Status: AC | PRN
  Administered 2020-01-12: 10 mL
  Filled 2020-01-12: qty 10

## 2020-01-12 MED ORDER — ACETAMINOPHEN 325 MG PO TABS
ORAL_TABLET | ORAL | Status: AC
  Filled 2020-01-12: qty 2

## 2020-01-12 NOTE — Patient Instructions (Signed)
Transfusin de sangre en los adultos, cuidados posteriores Blood Transfusion, Adult, Care After Esta hoja le brinda informacin sobre cmo cuidarse despus del procedimiento. El mdico tambin podr darle instrucciones ms especficas. Si tiene problemas o preguntas, llame al mdico. Qu puedo esperar despus del procedimiento? Despus del procedimiento, es normal tener los siguientes sntomas:  Hematomas y dolor en el lugar de la va intravenosa (i.v.).  Fiebre o escalofros el da del procedimiento. Esta puede ser la respuesta del cuerpo a las nuevas clulas sanguneas recibidas.  Dolor de cabeza. Siga estas instrucciones en su casa: Cuidados del lugar de la insercin      Siga las instrucciones del mdico en lo que respecta al cuidado del lugar de insercin. Este es el lugar donde se coloc un tubo (catter) intravenoso en la vena. Asegrese de hacer lo siguiente: ? Lvese las manos con agua y jabn antes y despus de cambiar la venda (vendaje). Use un desinfectante para manos si no dispone de agua y jabn. ? Cambie las vendas como se lo haya indicado el mdico.  Controle el lugar de insercin todos los das para detectar signos de infeccin. Preste atencin a los siguientes signos: ? Dolor, hinchazn o enrojecimiento. ? Sangrado proveniente del lugar. ? Calor. ? Pus o mal olor. Instrucciones generales  Use los medicamentos de venta libre y los recetados solamente como se lo haya indicado el mdico.  Haga reposo como se lo haya indicado el mdico.  Retome sus actividades habituales como se lo haya indicado el mdico.  Concurra a todas las visitas de seguimiento como se lo haya indicado el mdico. Esto es importante. Comunquese con un mdico si:  Tiene picazn o zonas enrojecidas e hinchadas en la piel (urticaria).  Est preocupado o nervioso (ansioso).  Se siente dbil despus de realizar sus actividades habituales.  Tiene enrojecimiento, hinchazn, calor o dolor  alrededor del lugar de la insercin.  Observa sangre que sale del lugar de la insercin que no se detiene al ejercer presin.  Tiene pus o percibe mal olor que proviene del lugar de la insercin. Solicite ayuda de inmediato si:  Tiene signos de una reaccin grave. Puede deberse a una alergia o provenir del sistema de defensa del cuerpo (sistema inmunitario). Algunos signos son los siguientes: ? Dificultad para respirar o falta de aire. ? Hinchazn en la cara o sensacin de calor (sofoco). ? Fiebre o escalofros. ? Dolor de cabeza, de pecho o de espalda. ? Pis (orina) de color oscuro o sangre en el pis. ? Erupcin cutnea generalizada. ? Latidos cardacos acelerados. ? Sentirse mareado o aturdido. Es posible que reciba la transfusin de sangre en un entorno ambulatorio. En tal caso, le indicarn con quin se debe poner en contacto para informar cualquier reaccin. Estos sntomas pueden indicar una emergencia. No espere a ver si los sntomas desaparecen. Solicite atencin mdica de inmediato. Comunquese con el servicio de emergencias de su localidad (911 en los [place]). No conduzca por sus propios medios hasta el hospital. Resumen  Es normal tener moretones y dolor en el lugar donde se coloc la va intravenosa (i.v.).  Controle el lugar de insercin todos los das para detectar signos de infeccin.  Haga reposo como se lo haya indicado el mdico. Retome sus actividades habituales como se lo haya indicado el mdico.  Obtenga ayuda de inmediato si tiene signos de una reaccin grave. Esta informacin no tiene como fin reemplazar el consejo del mdico. Asegrese de hacerle al mdico cualquier pregunta que tenga. Document   Revised: 01/12/2019 Document Reviewed: 01/12/2019 Elsevier Patient Education  2020 Elsevier Inc.  

## 2020-01-13 ENCOUNTER — Inpatient Hospital Stay: Payer: Medicaid Other

## 2020-01-13 ENCOUNTER — Other Ambulatory Visit: Payer: Self-pay

## 2020-01-13 VITALS — BP 118/89 | Temp 98.2°F | Resp 18

## 2020-01-13 DIAGNOSIS — C773 Secondary and unspecified malignant neoplasm of axilla and upper limb lymph nodes: Secondary | ICD-10-CM | POA: Diagnosis not present

## 2020-01-13 DIAGNOSIS — Z170 Estrogen receptor positive status [ER+]: Secondary | ICD-10-CM

## 2020-01-13 DIAGNOSIS — Z79899 Other long term (current) drug therapy: Secondary | ICD-10-CM | POA: Diagnosis not present

## 2020-01-13 DIAGNOSIS — Z5112 Encounter for antineoplastic immunotherapy: Secondary | ICD-10-CM | POA: Diagnosis not present

## 2020-01-13 DIAGNOSIS — Z5111 Encounter for antineoplastic chemotherapy: Secondary | ICD-10-CM | POA: Diagnosis not present

## 2020-01-13 DIAGNOSIS — D6481 Anemia due to antineoplastic chemotherapy: Secondary | ICD-10-CM | POA: Diagnosis not present

## 2020-01-13 DIAGNOSIS — C50411 Malignant neoplasm of upper-outer quadrant of right female breast: Secondary | ICD-10-CM | POA: Diagnosis not present

## 2020-01-13 DIAGNOSIS — Z171 Estrogen receptor negative status [ER-]: Secondary | ICD-10-CM | POA: Diagnosis not present

## 2020-01-13 LAB — TYPE AND SCREEN
ABO/RH(D): O POS
Antibody Screen: NEGATIVE
Unit division: 0
Unit division: 0

## 2020-01-13 LAB — BPAM RBC
Blood Product Expiration Date: 202109042359
Blood Product Expiration Date: 202109042359
ISSUE DATE / TIME: 202108040850
ISSUE DATE / TIME: 202108040850
Unit Type and Rh: 5100
Unit Type and Rh: 5100

## 2020-01-13 MED ORDER — PEGFILGRASTIM-JMDB 6 MG/0.6ML ~~LOC~~ SOSY
6.0000 mg | PREFILLED_SYRINGE | Freq: Once | SUBCUTANEOUS | Status: AC
  Administered 2020-01-13: 6 mg via SUBCUTANEOUS

## 2020-01-13 MED ORDER — PEGFILGRASTIM-JMDB 6 MG/0.6ML ~~LOC~~ SOSY
PREFILLED_SYRINGE | SUBCUTANEOUS | Status: AC
  Filled 2020-01-13: qty 0.6

## 2020-01-13 NOTE — Patient Instructions (Signed)

## 2020-01-14 ENCOUNTER — Telehealth: Payer: Self-pay | Admitting: Oncology

## 2020-01-14 NOTE — Telephone Encounter (Signed)
Scheduled appts per 8/3 los. Was not able to leave a voicemail. Pt to get updated appt calendar at next visit per appt notes.

## 2020-01-31 NOTE — Progress Notes (Signed)
[place]  Telephone:([phone]) [phone] [phone]) [phone]     ID: Alice [name] DOB: 1976-02-06  MR#: [id number]  VZC#:[id number]  Patient Care Team: Drue Flirt, MD as PCP - General (Family Medicine) Mauro Kaufmann, RN as Oncology Nurse Navigator Rockwell Germany, RN as Oncology Nurse Navigator Alphonsa Overall, MD as Consulting Physician (General Surgery) Tarhonda Hollenberg, Virgie Dad, MD as Consulting Physician (Oncology) Kyung Rudd, MD as Consulting Physician (Radiation Oncology) Chauncey Cruel, MD OTHER MD:  CHIEF COMPLAINT: estrogen receptor negative, Her2 positive breast cancer  CURRENT TREATMENT: Neoadjuvant chemotherapy (trastuzumab, pertuzumab, docetaxel, carboplatin )   INTERVAL HISTORY: Alice [name] returns today for follow-up and treatment of her estrogen receptor negative, Her2 positive breast cancer.  She is accompanied by a translator  She continues on neoadjuvant chemotherapy consisting of trastuzumab, pertuzumab, docetaxel, and carboplatin. Today is day 1 cycle 6, her final cycle.  3 weeks from now she will start her trastuzumab/Pertuzumab which will continue an additional 9 months.  Alice [name]'s last echocardiogram on 10/15/2019, showed an ejection fraction in the 65% - 70% range.   She is scheduled for post-treatment breast MRI on 02/09/2020 and is already scheduled to meet with Dr. Lucia Gaskins on 908 2021.    REVIEW OF SYSTEMS: Alice [name] has felt a bit more tired this last week.  She is spending a little bit more time in bed.  When she stands a lot she has discomfort in the soles of her feet but she has no numbness or tingling in her fingers or toes and when she lies in bed at night she has no numbness or tingling in her feet fingers or toes.  She has had no intercurrent fevers.  The port is working well.  A detailed review of systems today was otherwise stable   HISTORY OF CURRENT ILLNESS: From the original intake note:  Alice [name] presented with right breast  pain with swelling and skin changes (duskiness and dimpling) and left breast pain at 6 o'clock. She underwent bilateral diagnostic mammography with tomography and bilateral breast ultrasonography at [facility] on 09/23/2019 showing: breast density category C; 5.2 cm irregular mass in right breast spanning 10-12 o'clock, with marked peau d'orange changes concerning for inflammatory component; 3.3 cm enlarged lymph node in right axilla; indeterminate 1 cm oval mass in left breast at 5 o'clock.  Accordingly on 09/30/2019 she proceeded to biopsy of the right breast area in question. The pathology from this procedure ([id number]) showed: invasive mammary carcinoma, grade 3, e-cadherin positive. Prognostic indicators significant for: estrogen receptor, 0% negative and progesterone receptor, 0% negative. Proliferation marker Ki67 at 80%. HER2 positive by immunohistochemistry (3+).  The biopsied right axillary lymph node was positive for metastatic carcinoma.  She also underwent cyst aspiration of the 1 cm mass in the left breast the same day.  The patient's subsequent history is as detailed below.   PAST MEDICAL HISTORY: Past Medical History:  Diagnosis Date  . Hypertension     PAST SURGICAL HISTORY: Past Surgical History:  Procedure Laterality Date  . CHOLECYSTECTOMY    . IR IMAGING GUIDED PORT INSERTION  10/13/2019    FAMILY HISTORY: No family history on file.  As of 09/2019-- her father is 1 and her mother is 55. She has two brothers and one sister. There is no cancer in her family to her knowledge.    GYNECOLOGIC HISTORY:  No LMP recorded. Menarche: 44 years old Age at first live birth: 44 years old GX P 2 LMP regular periods lasting 3  days; periods stopped after the first chemotherapy dose May 2021 Contraceptive: previously used, has not used for the last 2 years HRT n/a  Hysterectomy? no BSO? no   SOCIAL HISTORY: (updated 09/2019)  Alice [name] [place] originally [place]  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 Husband [name] is currently unemployed but is a good Training and development officer. She lives at home with [name] and their two children. Son [name], age 53, has special needs.  Son [name], age 46, is in high school. She attends    ADVANCED DIRECTIVES: In the absence of any documentation to the contrary, the patient's spouse is their HCPOA.    HEALTH MAINTENANCE: Social History   Tobacco Use  . Smoking status: Never Smoker  . Smokeless tobacco: Never Used  Substance Use Topics  . Alcohol use: Never  . Drug use: Never     Colonoscopy: n/a (age)  PAP: 2020  Bone density: n/a (age)   No Known Allergies  Current Outpatient Medications  Medication Sig Dispense Refill  . dexamethasone (DECADRON) 4 MG tablet Take 2 tablets (8 mg total) by mouth 2 (two) times daily. Start the day before Taxotere. Take once the day after, then 2 times a day x 2d. 30 tablet 1  . doxycycline (VIBRA-TABS) 100 MG tablet Take 1 tablet (100 mg total) by mouth daily. 60 tablet 1  . ibuprofen (ADVIL,MOTRIN) 200 MG tablet Take 400 mg by mouth every 6 (six) hours as needed for fever, headache or cramping.     . lidocaine-prilocaine (EMLA) cream Apply to affected area once 30 g 3  . lisinopril (ZESTRIL) 10 MG tablet Take 10 mg by mouth daily.    Marland Kitchen loratadine (CLARITIN) 10 MG tablet Take 1 tablet (10 mg total) by mouth daily. 60 tablet 1  . LORazepam (ATIVAN) 0.5 MG tablet Take 1 tablet (0.5 mg total) by mouth at bedtime as needed (Nausea or vomiting). (Patient not taking: Reported on 11/30/2019) 20 tablet 0  . predniSONE (DELTASONE) 5 MG tablet 6 tab x 1 day, 5 tab x 1 day, 4 tab x 1 day, 3 tab x 1 day, 2 tab x 1 day, 1 tab x 1 day, stop 21 tablet 0  . prochlorperazine (COMPAZINE) 10 MG tablet Take 1 tablet (10 mg total) by mouth every 6 (six) hours as needed (Nausea or vomiting). (Patient not taking: Reported on 11/30/2019) 30 tablet 1   No current facility-administered medications for  this visit.    OBJECTIVE: Spanish speaker in no acute distress  Vitals:   02/01/20 0840  BP: 128/90  Pulse: (!) 107  Resp: 20  Temp: 97.8 F (36.6 C)  SpO2: 100%   Wt Readings from Last 3 Encounters:  02/01/20 204 lb 1.6 oz (92.6 kg)  01/11/20 200 lb 12.8 oz (91.1 kg)  01/03/20 199 lb (90.3 kg)   Body mass index is 35.03 kg/m.    ECOG FS:1 - Symptomatic but completely ambulatory  Sclerae unicteric, EOMs intact Wearing a mask No cervical or supraclavicular adenopathy Lungs no rales or rhonchi Heart regular rate and rhythm Abd soft, nontender, positive bowel sounds MSK no focal spinal tenderness, no upper extremity lymphedema Neuro: nonfocal, well oriented, appropriate affect Breasts: I do not palpate a mass in the right breast and there are no skin or nipple changes of concern.  The previously noted skin thickening is now minimal to absent.  There is no erythema.  Left breast is benign.  Both axillae are benign.  Right breast 10/06/2019 LAB RESULTS:  CMP     Component Value Date/Time   NA 139 02/01/2020 0835   K 3.3 (L) 02/01/2020 0835   CL 104 02/01/2020 0835   CO2 25 02/01/2020 0835   GLUCOSE 106 (H) 02/01/2020 0835   BUN 8 02/01/2020 0835   CREATININE 0.70 02/01/2020 0835   CREATININE 0.79 10/06/2019 0826   CALCIUM 8.2 (L) 02/01/2020 0835   PROT 6.4 (L) 02/01/2020 0835   ALBUMIN 3.5 02/01/2020 0835   AST 27 02/01/2020 0835   AST 26 10/06/2019 0826   ALT 39 02/01/2020 0835   ALT 47 (H) 10/06/2019 0826   ALKPHOS 121 02/01/2020 0835   BILITOT 0.5 02/01/2020 0835   BILITOT 0.7 10/06/2019 0826   GFRNONAA >60 02/01/2020 0835   GFRNONAA >60 10/06/2019 0826   GFRAA >60 02/01/2020 0835   GFRAA >60 10/06/2019 0826    No results found for: TOTALPROTELP, ALBUMINELP, A1GS, A2GS, BETS, BETA2SER, GAMS, MSPIKE, SPEI  Lab Results  Component Value Date   WBC 5.5 02/01/2020   NEUTROABS 3.4 02/01/2020   HGB 8.4 (L) 02/01/2020   HCT 25.4 (L) 02/01/2020   MCV  94.4 02/01/2020   PLT 75 (L) 02/01/2020    No results found for: LABCA2  No components found for: OTLXBW620  No results for input(s): INR in the last 168 hours.  No results found for: LABCA2  No results found for: BTD974  No results found for: BUL845  No results found for: XMI680  No results found for: CA2729  No components found for: HGQUANT  No results found for: CEA1 / No results found for: CEA1   No results found for: AFPTUMOR  No results found for: CHROMOGRNA  No results found for: KPAFRELGTCHN, LAMBDASER, KAPLAMBRATIO (kappa/lambda light chains)  No results found for: HGBA, HGBA2QUANT, HGBFQUANT, HGBSQUAN (Hemoglobinopathy evaluation)   No results found for: LDH  No results found for: IRON, TIBC, IRONPCTSAT (Iron and TIBC)  No results found for: FERRITIN  Urinalysis No results found for: COLORURINE, APPEARANCEUR, LABSPEC, PHURINE, GLUCOSEU, HGBUR, BILIRUBINUR, KETONESUR, PROTEINUR, UROBILINOGEN, NITRITE, LEUKOCYTESUR   STUDIES: No results found.   ELIGIBLE FOR AVAILABLE RESEARCH PROTOCOL: no  ASSESSMENT: 44 y.o. [place] speaker status post right breast upper outer quadrant sites biopsy and right axillary lymph node biopsy 09/30/2019 for a clinical T3 N2, stage IIIA invasive ductal carcinoma, grade 3, estrogen and progesterone receptor negative, HER-2 amplified, with an MIB-1 of 80%.  (a) CT chest abdomen and pelvis and bone scan 10/15/2019 showed no evidence of metastatic disease  (1) genetics testing 10/13/2019 through the Common Hereditary Cancers Panel offered by [facility] found no deleterious mutations in APC, ATM, AXIN2, BARD1, BMPR1A, BRCA1, BRCA2, BRIP1, CDH1, CDKN2A (p14ARF), CDKN2A (p16INK4a), CKD4, CHEK2, CTNNA1, DICER1, EPCAM (Deletion/duplication testing only), GREM1 (promoter region deletion/duplication testing only), KIT, MEN1, MLH1, MSH2, MSH3, MSH6, MUTYH, NBN, NF1, NHTL1, PALB2, PDGFRA, PMS2, POLD1, POLE, PTEN, RAD50, RAD51C, RAD51D,  RNF43, SDHB, SDHC, SDHD, SMAD4, SMARCA4. STK11, TP53, TSC1, TSC2, and VHL.  The following genes were evaluated for sequence changes only: SDHA and HOXB13 c.251G>A variant only.  (2) neoadjuvant chemotherapy to consist of trastuzumab, pertuzumab, docetaxel and carboplatin every 21 days x 6 starting 10/19/2019  (3) trastuzumab and pertuzumab to be continued to total 1 year  (a) echo 10/15/2019 shows an ejection fraction in the 65-70% range.  (4) definitive surgery to follow  (5) adjuvant radiation.   PLAN: Alice [name] is beginning to show signs of increasing symptoms from her chemo but luckily  no peripheral neuropathy.  She is moderately thrombocytopenic but we are ProGrip proceeding with treatment today in any case.  I am bringing her back in 1 week to transfuse platelets or red cells as may be needed.  She understands that she is done with chemo after today but she needs to keep her port because we will continue the trastuzumab and Pertuzumab an additional 9 months.  She is scheduled for breast MRI on 901 2021 and follow-up with Dr. Lucia Gaskins 02/16/2020 at which point they will decide what type of surgery is best for her.  Of course she will need a translator for that as well as all other visits  She will return here on 02/22/2020 to start her trastuzumab/Pertuzumab which will continue through surgery and radiation.  Total encounter time 30 minutes.Lurline Del, MD 02/01/20 9:13 AM Medical Oncology and Hematology [facility], [place] [address] Tel. [phone]    Fax. [phone]   I, Wilburn Mylar, am acting as scribe for Dr. Virgie Dad. Constantine Ruddick.  I, Lurline Del MD, have reviewed the above documentation for accuracy and completeness, and I agree with the above.   *Total Encounter Time as defined by the Centers for Medicare and Medicaid Services includes, in addition to the face-to-face time of a patient visit (documented in the note  above) non-face-to-face time: obtaining and reviewing outside history, ordering and reviewing medications, tests or procedures, care coordination (communications with other health care professionals or caregivers) and documentation in the medical record.

## 2020-02-01 ENCOUNTER — Inpatient Hospital Stay (HOSPITAL_BASED_OUTPATIENT_CLINIC_OR_DEPARTMENT_OTHER): Payer: Medicaid Other | Admitting: Oncology

## 2020-02-01 ENCOUNTER — Telehealth: Payer: Self-pay | Admitting: *Deleted

## 2020-02-01 ENCOUNTER — Other Ambulatory Visit: Payer: Self-pay

## 2020-02-01 ENCOUNTER — Inpatient Hospital Stay: Payer: Medicaid Other

## 2020-02-01 ENCOUNTER — Encounter: Payer: Self-pay | Admitting: *Deleted

## 2020-02-01 VITALS — BP 128/90 | HR 107 | Temp 97.8°F | Resp 20 | Ht 64.0 in | Wt 204.1 lb

## 2020-02-01 DIAGNOSIS — Z170 Estrogen receptor positive status [ER+]: Secondary | ICD-10-CM

## 2020-02-01 DIAGNOSIS — C773 Secondary and unspecified malignant neoplasm of axilla and upper limb lymph nodes: Secondary | ICD-10-CM | POA: Diagnosis not present

## 2020-02-01 DIAGNOSIS — Z95828 Presence of other vascular implants and grafts: Secondary | ICD-10-CM

## 2020-02-01 DIAGNOSIS — Z171 Estrogen receptor negative status [ER-]: Secondary | ICD-10-CM | POA: Diagnosis not present

## 2020-02-01 DIAGNOSIS — C50411 Malignant neoplasm of upper-outer quadrant of right female breast: Secondary | ICD-10-CM | POA: Diagnosis not present

## 2020-02-01 DIAGNOSIS — Z5111 Encounter for antineoplastic chemotherapy: Secondary | ICD-10-CM | POA: Diagnosis not present

## 2020-02-01 DIAGNOSIS — D6481 Anemia due to antineoplastic chemotherapy: Secondary | ICD-10-CM | POA: Diagnosis not present

## 2020-02-01 DIAGNOSIS — Z5112 Encounter for antineoplastic immunotherapy: Secondary | ICD-10-CM | POA: Diagnosis not present

## 2020-02-01 DIAGNOSIS — Z79899 Other long term (current) drug therapy: Secondary | ICD-10-CM | POA: Diagnosis not present

## 2020-02-01 LAB — CBC WITH DIFFERENTIAL/PLATELET
Abs Immature Granulocytes: 0.02 10*3/uL (ref 0.00–0.07)
Basophils Absolute: 0 10*3/uL (ref 0.0–0.1)
Basophils Relative: 0 %
Eosinophils Absolute: 0 10*3/uL (ref 0.0–0.5)
Eosinophils Relative: 0 %
HCT: 25.4 % — ABNORMAL LOW (ref 36.0–46.0)
Hemoglobin: 8.4 g/dL — ABNORMAL LOW (ref 12.0–15.0)
Immature Granulocytes: 0 %
Lymphocytes Relative: 29 %
Lymphs Abs: 1.6 10*3/uL (ref 0.7–4.0)
MCH: 31.2 pg (ref 26.0–34.0)
MCHC: 33.1 g/dL (ref 30.0–36.0)
MCV: 94.4 fL (ref 80.0–100.0)
Monocytes Absolute: 0.4 10*3/uL (ref 0.1–1.0)
Monocytes Relative: 8 %
Neutro Abs: 3.4 10*3/uL (ref 1.7–7.7)
Neutrophils Relative %: 63 %
Platelets: 75 10*3/uL — ABNORMAL LOW (ref 150–400)
RBC: 2.69 MIL/uL — ABNORMAL LOW (ref 3.87–5.11)
RDW: 17.4 % — ABNORMAL HIGH (ref 11.5–15.5)
WBC: 5.5 10*3/uL (ref 4.0–10.5)
nRBC: 0 % (ref 0.0–0.2)

## 2020-02-01 LAB — COMPREHENSIVE METABOLIC PANEL
ALT: 39 U/L (ref 0–44)
AST: 27 U/L (ref 15–41)
Albumin: 3.5 g/dL (ref 3.5–5.0)
Alkaline Phosphatase: 121 U/L (ref 38–126)
Anion gap: 10 (ref 5–15)
BUN: 8 mg/dL (ref 6–20)
CO2: 25 mmol/L (ref 22–32)
Calcium: 8.2 mg/dL — ABNORMAL LOW (ref 8.9–10.3)
Chloride: 104 mmol/L (ref 98–111)
Creatinine, Ser: 0.7 mg/dL (ref 0.44–1.00)
GFR calc Af Amer: >60 mL/min (ref >60)
GFR calc non Af Amer: >60 mL/min (ref >60)
Glucose, Bld: 106 mg/dL — ABNORMAL HIGH (ref 70–99)
Potassium: 3.3 mmol/L — ABNORMAL LOW (ref 3.5–5.1)
Sodium: 139 mmol/L (ref 135–145)
Total Bilirubin: 0.5 mg/dL (ref 0.3–1.2)
Total Protein: 6.4 g/dL — ABNORMAL LOW (ref 6.5–8.1)

## 2020-02-01 LAB — PREGNANCY, URINE: Preg Test, Ur: NEGATIVE

## 2020-02-01 MED ORDER — SODIUM CHLORIDE 0.9 % IV SOLN
Freq: Once | INTRAVENOUS | Status: AC
  Filled 2020-02-01: qty 250

## 2020-02-01 MED ORDER — SODIUM CHLORIDE 0.9% FLUSH
10.0000 mL | INTRAVENOUS | Status: DC | PRN
  Administered 2020-02-01: 10 mL
  Filled 2020-02-01: qty 10

## 2020-02-01 MED ORDER — DIPHENHYDRAMINE HCL 25 MG PO CAPS
ORAL_CAPSULE | ORAL | Status: AC
  Filled 2020-02-01: qty 1

## 2020-02-01 MED ORDER — SODIUM CHLORIDE 0.9 % IV SOLN
750.0000 mg | Freq: Once | INTRAVENOUS | Status: AC
  Administered 2020-02-01: 750 mg via INTRAVENOUS
  Filled 2020-02-01: qty 75

## 2020-02-01 MED ORDER — SODIUM CHLORIDE 0.9 % IV SOLN
10.0000 mg | Freq: Once | INTRAVENOUS | Status: AC
  Administered 2020-02-01: 10 mg via INTRAVENOUS
  Filled 2020-02-01: qty 10

## 2020-02-01 MED ORDER — ACETAMINOPHEN 325 MG PO TABS
650.0000 mg | ORAL_TABLET | Freq: Once | ORAL | Status: AC
  Administered 2020-02-01: 650 mg via ORAL

## 2020-02-01 MED ORDER — PALONOSETRON HCL INJECTION 0.25 MG/5ML
0.2500 mg | Freq: Once | INTRAVENOUS | Status: AC
  Administered 2020-02-01: 0.25 mg via INTRAVENOUS

## 2020-02-01 MED ORDER — HEPARIN SOD (PORK) LOCK FLUSH 100 UNIT/ML IV SOLN
500.0000 [IU] | Freq: Once | INTRAVENOUS | Status: AC | PRN
  Administered 2020-02-01: 500 [IU]
  Filled 2020-02-01: qty 5

## 2020-02-01 MED ORDER — SODIUM CHLORIDE 0.9 % IV SOLN
150.0000 mg | Freq: Once | INTRAVENOUS | Status: AC
  Administered 2020-02-01: 150 mg via INTRAVENOUS
  Filled 2020-02-01: qty 150

## 2020-02-01 MED ORDER — SODIUM CHLORIDE 0.9 % IV SOLN
75.0000 mg/m2 | Freq: Once | INTRAVENOUS | Status: AC
  Administered 2020-02-01: 150 mg via INTRAVENOUS
  Filled 2020-02-01: qty 15

## 2020-02-01 MED ORDER — ACETAMINOPHEN 325 MG PO TABS
ORAL_TABLET | ORAL | Status: AC
  Filled 2020-02-01: qty 2

## 2020-02-01 MED ORDER — TRASTUZUMAB-DKST CHEMO 150 MG IV SOLR
600.0000 mg | Freq: Once | INTRAVENOUS | Status: AC
  Administered 2020-02-01: 600 mg via INTRAVENOUS
  Filled 2020-02-01: qty 28.57

## 2020-02-01 MED ORDER — SODIUM CHLORIDE 0.9 % IV SOLN
420.0000 mg | Freq: Once | INTRAVENOUS | Status: AC
  Administered 2020-02-01: 420 mg via INTRAVENOUS
  Filled 2020-02-01: qty 14

## 2020-02-01 MED ORDER — DIPHENHYDRAMINE HCL 25 MG PO CAPS
25.0000 mg | ORAL_CAPSULE | Freq: Once | ORAL | Status: AC
  Administered 2020-02-01: 25 mg via ORAL

## 2020-02-01 MED ORDER — PALONOSETRON HCL INJECTION 0.25 MG/5ML
INTRAVENOUS | Status: AC
  Filled 2020-02-01: qty 5

## 2020-02-01 NOTE — Telephone Encounter (Signed)
Per MD review of labs drawn today with decreased platelets - ok to proceed with treatment.

## 2020-02-01 NOTE — Patient Instructions (Signed)
[facility] Discharge Instructions for Patients Receiving Chemotherapy  Today you received the following chemotherapy agents Trastuzumab; Pertuzumab; Carboplatin; Docetaxel  To help prevent nausea and vomiting after your treatment, we encourage you to take your nausea medication as directed   If you develop nausea and vomiting that is not controlled by your nausea medication, call the clinic.   BELOW ARE SYMPTOMS THAT SHOULD BE REPORTED IMMEDIATELY:  *FEVER GREATER THAN 100.5 F  *CHILLS WITH OR WITHOUT FEVER  NAUSEA AND VOMITING THAT IS NOT CONTROLLED WITH YOUR NAUSEA MEDICATION  *UNUSUAL SHORTNESS OF BREATH  *UNUSUAL BRUISING OR BLEEDING  TENDERNESS IN MOUTH AND THROAT WITH OR WITHOUT PRESENCE OF ULCERS  *URINARY PROBLEMS  *BOWEL PROBLEMS  UNUSUAL RASH Items with * indicate a potential emergency and should be followed up as soon as possible.  Feel free to call the clinic should you have any questions or concerns. The clinic phone number is [phone].  Please show the CHEMO ALERT CARD at check-in to the Emergency Department and triage nurse.  Trastuzumab; Hyaluronidase injection Qu es este medicamento? TRASTUZUMAB; HIALURONIDASA se usa para tratar el cncer de mama y el cncer de estmago. El trastuzumab es un anticuerpo monoclonal. La hialuronidasa se usa para mejorar los efectos del trastuzumab. Este medicamento puede ser utilizado para otros usos; si tiene alguna pregunta consulte con su proveedor de atencin mdica o con su farmacutico. MARCAS COMUNES: HERCEPTIN HYLECTA Qu le debo informar a mi profesional de la salud antes de tomar este medicamento? Necesitan saber si usted presenta alguno de los siguientes problemas o situaciones: enfermedad cardiaca insuficiencia cardiaca enfermedad pulmonar o respiratoria, como asma una reaccin alrgica o inusual al trastuzumab, a otros medicamentos, alimentos, colorantes o conservantes si est embarazada o  buscando quedar embarazada si est amamantando a un beb Cmo debo utilizar este medicamento? Este medicamento se administra mediante una inyeccin por va subcutnea. Lo administra un profesional de la salud en un hospital o en un entorno clnico. Hable con su pediatra para informarse acerca del uso de este medicamento en nios. Este medicamento no est aprobado para uso en nios. Sobredosis: Pngase en contacto inmediatamente con un centro toxicolgico o una sala de urgencia si usted cree que haya tomado demasiado medicamento. ATENCIN: Este medicamento es solo para usted. No comparta este medicamento con nadie. Qu sucede si me olvido de una dosis? Es importante no olvidar ninguna dosis. Informe a su mdico o a su profesional de la salud si no puede asistir a una cita. Qu puede interactuar con este medicamento? Este medicamento podra interactuar con los siguientes frmacos: ciertos tipos de quimioterapia, tales como daunorubicina, doxorrubicina, epirubicina, e idarubicina Puede ser que esta lista no menciona todas las posibles interacciones. Informe a su profesional de la salud de todos los productos a base de hierbas, medicamentos de venta libre o suplementos nutritivos que est tomando. Si usted fuma, consume bebidas alcohlicas o si utiliza drogas ilegales, indqueselo tambin a su profesional de la salud. Algunas sustancias pueden interactuar con su medicamento. A qu debo estar atento al usar este medicamento? Visite a su mdico para que revise su evolucin. Si presenta algn efecto secundario, infrmelo. Contine con el tratamiento aun si se siente enfermo, a menos que su mdico le indique que lo suspenda. Consulte a su mdico o a su profesional de la salud si tiene fiebre, escalofros o dolor de garganta, o cualquier otro sntoma de resfro o gripe. No se trate usted mismo. Trate de no acercarse a personas que estn   enfermas. Es posible que tenga fiebre, escalofros y temblores durante  su primera infusin. Estos efectos generalmente son leves y pueden tratarse con otros medicamentos. Informe cualquier efecto secundario durante la infusin a su profesional de la salud. La fiebre y los escalofros por lo general no suceden con las infusiones posteriores. No debe quedar embarazada mientras est usando este medicamento o por 7 meses despus de dejar de usarlo. Las mujeres deben informar a su mdico si estn buscando quedar embarazadas o si creen que podran estar embarazadas. Las mujeres con la posibilidad de tener nios deben tener una prueba de embarazo negativa antes de empezar a tomar este medicamento. Existe la posibilidad de efectos secundarios graves en un beb sin nacer. Para obtener ms informacin, hable con su profesional de la salud o su farmacutico. No debe amamantar a un beb mientras est tomando este medicamento o por 7 meses despus de dejar de usarlo. Qu efectos secundarios puedo tener al utilizar este medicamento? Efectos secundarios que debe informar a su mdico o a su profesional de la salud tan pronto como sea posible: reacciones alrgicas, como erupcin cutnea, comezn/picazn o urticaria, e hinchazn de la cara, los labios o la lengua problemas respiratorios dolor en el pecho o palpitaciones tos fiebre sensacin general de estar enfermo o sntomas gripales signos de empeoramiento de la insuficiencia cardiaca, tales como problemas respiratorios; hinchazn en las piernas y los pies Efectos secundarios que generalmente no requieren atencin mdica (debe informarlos a su mdico o a su profesional de la salud si persisten o si son molestos): dolor de huesos cambios en el sentido del gusto diarrea dolor en las articulaciones nuseas, vmito cansancio o debilidad inusual prdida de peso Puede ser que esta lista no menciona todos los posibles efectos secundarios. Comunquese a su mdico por asesoramiento mdico sobre los efectos secundarios. Usted puede informar los efectos  secundarios a la FDA por telfono al 1-800-FDA-1088. Dnde debo guardar mi medicina? Este medicamento se administra en hospitales o clnicas, y no necesitar guardarlo en su domicilio. ATENCIN: Este folleto es un resumen. Puede ser que no cubra toda la posible informacin. Si usted tiene preguntas acerca de esta medicina, consulte con su mdico, su farmacutico o su profesional de la salud.  2020 Elsevier/Gold Standard (2018-04-06 00:00:00)  Pertuzumab injection Qu es este medicamento? El PERTUZUMAB es un anticuerpo monoclonal. Se utiliza para tratar el cncer de mama. Este medicamento puede ser utilizado para otros usos; si tiene alguna pregunta consulte con su proveedor de atencin mdica o con su farmacutico. MARCAS COMUNES: PERJETA Qu le debo informar a mi profesional de la salud antes de tomar este medicamento? Necesita saber si usted presenta alguno de los siguientes problemas o situaciones: enfermedad cardiaca insuficiencia cardiaca alta presin sangunea antecedentes de pulso cardiaca irregular radioterapia reciente o continuada una reaccin alrgica o inusual al pertuzumab, a otros medicamentos, alimentos, colorantes o conservantes si est embarazada o buscando quedar embarazada si est amamantando a un beb Cmo debo utilizar este medicamento? Este medicamento se administra mediante infusin por va intravenosa. Lo administra un profesional de la salud en un hospital o en un entorno clnico. Hable con su pediatra para informarse acerca del uso de este medicamento en nios. Puede requerir atencin especial. Sobredosis: Pngase en contacto inmediatamente con un centro toxicolgico o una sala de urgencia si usted cree que haya tomado demasiado medicamento. ATENCIN: Este medicamento es solo para usted. No comparta este medicamento con nadie. Qu sucede si me olvido de una dosis? Es importante no olvidar ninguna dosis.   Informe a su mdico o a su profesional de la salud si no puede  asistir a una cita. Qu puede interactuar con este medicamento? No se esperan interacciones. Puede ser que esta lista no menciona todas las posibles interacciones. Informe a su profesional de la salud de todos los productos a base de hierbas, medicamentos de venta libre o suplementos nutritivos que est tomando. Si usted fuma, consume bebidas alcohlicas o si utiliza drogas ilegales, indqueselo tambin a su profesional de la salud. Algunas sustancias pueden interactuar con su medicamento. A qu debo estar atento al usar este medicamento? Se supervisar su estado de salud atentamente mientras reciba este medicamento. Si presenta algn efecto secundario, infrmelo. Sin embargo, contine con el tratamiento aun si se siente enfermo, a menos que su mdico le indique que lo suspenda. No debe quedar embarazada mientras est tomando este medicamento o por 7 meses despus de dejar de usarlo. Las mujeres deben informar a su mdico si estn buscando quedar embarazadas o si creen que estn embarazadas. Las mujeres con la posibilidad de tener nios deben tener una prueba de embarazo negativa antes de empezar a tomar este medicamento. Existe la posibilidad de que ocurran efectos secundarios graves a un beb sin nacer. Para ms informacin hable con su profesional de la salud o su farmacutico. No debe amamantar a un beb mientras est tomando este medicamento o por 7 meses despus de dejar de usarlo. Las mujeres deben usar un mtodo anticonceptivo eficaz con este medicamento. Consulte a su mdico o a su profesional de la salud si tiene fiebre, escalofros, dolor de garganta, o cualquier otro sntoma de resfro o gripe. No se trate usted mismo. Trate de no acercarse a personas que estn enfermas. Es posible que tenga fiebre, escalofros y dolor de cabeza durante la infusin. Informe cualquier efecto secundario durante la infusin a su profesional de la salud. Qu efectos secundarios puedo tener al utilizar este  medicamento? Efectos secundarios que debe informar a su mdico o a su profesional de la salud tan pronto como sea posible: problemas respiratorios dolor en el pecho o palpitaciones mareos sensacin de desmayos o aturdimiento fiebre o escalofros erupcin cutnea, picazn o urticaria dolor de garganta hinchazn de la cara, labios o lengua hinchazn de piernas o tobillos cansancio o debilidad inusual Efectos secundarios que generalmente no requieren atencin mdica (infrmelos a su mdico o a su profesional de la salud si persisten o si son molestos): diarrea cada del cabello nuseas, vmito cansancio Puede ser que esta lista no menciona todos los posibles efectos secundarios. Comunquese a su mdico por asesoramiento mdico sobre los efectos secundarios. Usted puede informar los efectos secundarios a la FDA por telfono al 1-800-FDA-1088. Dnde debo guardar mi medicina? Este medicamento se administra en hospitales o clnicas y no necesitar guardarlo en su domicilio. ATENCIN: Este folleto es un resumen. Puede ser que no cubra toda la posible informacin. Si usted tiene preguntas acerca de esta medicina, consulte con su mdico, su farmacutico o su profesional de la salud.  2020 Elsevier/Gold Standard (2016-06-27 00:00:00)  Carboplatin injection Qu es este medicamento? El CARBOPLATINO es un agente quimioteraputico. Este medicamento acta sobre las clulas que se dividen rpidamente, como las clulas cancergenas, y finalmente provoca la muerte de estas clulas. Se utiliza en el tratamiento del cncer de ovario y muchos otros tipos de cncer. Este medicamento puede ser utilizado para otros usos; si tiene alguna pregunta consulte con su proveedor de atencin mdica o con su farmacutico. MARCAS COMUNES: Paraplatin Qu le debo informar   a mi profesional de la salud antes de tomar este medicamento? Necesita saber si usted presenta alguno de los siguientes problemas o situaciones:  trastornos  sanguneos  problemas auditivos  enfermedad renal  radioterapia reciente o continuada  una reaccin alrgica o inusual al carboplatino, al cisplatino, a otros agentes quimioteraputicos, a otros medicamentos, alimentos, colorantes o conservantes  si est embarazada o buscando quedar embarazada  si est amamantando a un beb Cmo debo utilizar este medicamento? Este medicamento se administra normalmente mediante infusin por va intravenosa. Lo administra un profesional de la salud calificado en un hospital o en un entorno clnico. Hable con su pediatra para informarse acerca del uso de este medicamento en nios. Puede requerir atencin especial. Sobredosis: Pngase en contacto inmediatamente con un centro toxicolgico o una sala de urgencia si usted cree que haya tomado demasiado medicamento. ATENCIN: Este medicamento es solo para usted. No comparta este medicamento con nadie. Qu sucede si me olvido de una dosis? Es importante no olvidar ninguna dosis. Informe a su mdico o a su profesional de la salud si no puede asistir a una cita. Qu puede interactuar con este medicamento?  medicamentos para convulsiones  medicamentos para incrementar los conteos sanguneos, tales como filgrastim, pegfilgrastim, sargramostim  ciertos antibiticos, tales como amicacina, gentamicina, neomicina, estreptomicina, tobramicina  vacunas Consulte a su mdico o a su profesional de la salud antes de tomar cualquiera de los siguientes medicamentos:  acetaminofeno  aspirina  ibuprofeno  quetoprofeno  naproxeno Puede ser que esta lista no menciona todas las posibles interacciones. Informe a su profesional de la salud de todos los productos a base de hierbas, medicamentos de venta libre o suplementos nutritivos que est tomando. Si usted fuma, consume bebidas alcohlicas o si utiliza drogas ilegales, indqueselo tambin a su profesional de la salud. Algunas sustancias pueden interactuar con su  medicamento. A qu debo estar atento al usar este medicamento? Se supervisar su estado de salud atentamente mientras reciba este medicamento. Tendr que hacerse anlisis de sangre peridicos mientras est tomando este medicamento. Este medicamento puede hacerle sentir un malestar general. Esto es normal ya que la quimioterapia afecta tanto a las clulas sanas como a las clulas cancerosas. Si presenta alguno de los efectos secundarios, infrmelos. Sin embargo, contine con el tratamiento aun si se siente enfermo, a menos que su mdico le indique que lo suspenda. En algunos casos, podr recibir medicamentos adicionales para ayudarle con los efectos secundarios. Siga las instrucciones para usarlos. Consulte a su mdico o a su profesional de la salud por asesoramiento si tiene fiebre, escalofros, dolor de garganta o cualquier otro sntoma de resfro o gripe. No se trate usted mismo. Este medicamento puede reducir la capacidad del cuerpo para combatir infecciones. Trate de no acercarse a personas que estn enfermas. Este medicamento puede aumentar el riesgo de magulladuras o sangrado. Consulte a su mdico o a su profesional de la salud si observa sangrados inusuales. Proceda con cuidado al cepillar sus dientes, usar hilo dental o utilizar palillos para los dientes, ya que puede contraer una infeccin o sangrar con mayor facilidad. Si se somete a algn tratamiento dental, informe a su dentista que est usando este medicamento. Evite tomar productos que contienen aspirina, acetaminofeno, ibuprofeno, naproxeno o quetoprofeno a menos que as lo indique su mdico. Estos productos pueden disimular la fiebre. No se debe quedar embarazada mientras recibe este medicamento. Las mujeres deben informar a su mdico si estn buscando quedar embarazadas o si creen que estn embarazadas. Existe la posibilidad de efectos secundarios   graves a un beb sin nacer. Para ms informacin hable con su profesional de la salud o su  farmacutico. No debe Economist a un beb mientras est usando este medicamento. Qu efectos secundarios puedo tener al [employer] este medicamento? Efectos secundarios que debe informar a su mdico o a Barrister's clerk de la salud tan pronto como sea posible:  Chief of Staff como erupcin cutnea, picazn o urticarias, hinchazn de la cara, labios o lengua  signos de infeccin - fiebre o escalofros, tos, dolor de garganta, dolor o dificultad para orinar  signos de reduccin de plaquetas o sangrado - magulladuras, puntos rojos en la piel, heces de color oscuro o con aspecto alquitranado, sangrando por la nariz  signos de reduccin de glbulos rojos - cansancio o debilidad inusual, desmayos, sensacin de [employer]  problemas respiratorios  cambios de audicin  cambios en la visin  dolor en el pecho  alta presin sangunea  conteos sanguneos bajos - Este medicamento puede reducir la cantidad de glbulos blancos, glbulos rojos y plaquetas. Su riesgo de infeccin y [place].  nuseas, vmito  dolor, enrojecimiento, hinchazn o irritacin en el lugar de la inyeccin  dolor, hormigueo, entumecimiento de manos o pies  problemas de coordinacin, del habla, al caminar  dificultad para orinar o cambios en el volumen de orina Efectos secundarios que, por lo general, no requieren atencin mdica (debe informarlos a su mdico o a su profesional de la salud si persisten o si son molestos):  cada del cabello  prdida del apetito  sabor metlico o cambios en el sentido del gusto Puede ser que esta lista no menciona todos los posibles efectos secundarios. Comunquese a su mdico por asesoramiento mdico [employer] Usted puede informar los efectos secundarios a la FDA por telfono al 1-800-FDA-1088. Dnde debo guardar mi medicina? Este medicamento se administra en hospitales o clnicas y no necesitar guardarlo en su domicilio. ATENCIN: Este folleto  es un resumen. Puede ser que no cubra toda la posible informacin. Si usted tiene preguntas acerca de esta medicina, consulte con su mdico, su farmacutico o su profesional de Technical sales engineer.  2020 Elsevier/Gold Standard (2014-07-19 00:00:00)  Docetaxel injection Qu es este medicamento? El DOCETAXEL es un agente quimioteraputico. Este medicamento acta sobre las clulas que se dividen rpidamente, como las clulas cancergenas, y finalmente provoca la muerte de estas clulas. Se utiliza en el tratamiento de muchos tipos de cncer como el cncer de mama, adenocarcinoma gstrico, cabeza y cuello, pulmn y prstata. Este medicamento puede ser utilizado para otros usos; si tiene alguna pregunta consulte con su proveedor de atencin mdica o con su farmacutico. MARCAS COMUNES: Docefrez, Taxotere Qu le debo informar a mi profesional de la salud antes de tomar este medicamento? Necesita saber si usted presenta alguno de los siguientes problemas o situaciones:  infeccin (especialmente infecciones virales, como varicela o herpes)  enfermedad heptica  conteos sanguneos bajos, como baja cantidad de glbulos blancos, glbulos rojos y plaquetas  una reaccin alrgica o inusual al docetaxel, polisorbato 80, a otros agentes quimioteraputicos, a otros medicamentos, alimentos, colorantes o conservantes  si est embarazada o buscando quedar embarazada  si est amamantando a un beb Cmo debo utilizar este medicamento? Este medicamento se administra como infusin en una vena. Un profesional de la salud especialmente capacitado lo administra en un hospital o clnica. Hable con su pediatra para informarse acerca del uso de este medicamento en nios. Puede requerir atencin especial. Sobredosis: Pngase en contacto inmediatamente con un centro toxicolgico o  una sala de urgencia si usted cree que haya tomado demasiado medicamento. ATENCIN: [employer] es solo para usted. No comparta este medicamento  con nadie. Qu sucede si me olvido de una dosis? Es importante no olvidar ninguna dosis. Informe a su mdico o a su profesional de la salud si no puede asistir a Photographer. Qu puede interactuar con este medicamento? aprepitant ciertos antibiticos, tales como eritromicina o claritromicina ciertos medicamentos antivirales para VIH o hepatitis ciertos medicamentos para infecciones micticas, tales como fluconazol, itraconazol, ketoconazol, posaconazol o voriconazol cimetidina ciprofloxacino conivaptn ciclosporina dronedarona fluvoxamina jugo de toronja (pomelo) imatinib verapamilo Puede ser que esta lista no menciona todas las posibles interacciones. Informe a su profesional de [employer]	[place] de [employer] productos a base de hierbas, medicamentos de [place] o suplementos nutritivos que est tomando. Si usted fuma, consume bebidas alcohlicas o si utiliza drogas ilegales, indqueselo tambin a su profesional de [employer]	[place]. Algunas sustancias pueden interactuar con su medicamento. A qu debo estar atento al usar [employer]? Se supervisar su estado de salud atentamente mientras reciba este medicamento. Tendr que hacerse anlisis de sangre importantes mientras est usando este medicamento. Llame a su mdico o a su profesional de la salud si tiene fiebre, escalofros o dolor de garganta, o cualquier otro sntoma de resfro o gripe. No se trate usted mismo. Este medicamento reduce la capacidad del cuerpo para combatir infecciones. Trate de no acercarse a personas que estn enfermas. Algunos productos pueden contener alcohol. Pregunte a su profesional de la salud si este medicamento contiene alcohol. Asegrese de informar a todos los profesionales de la salud que usted est usando [place]. Ciertos medicamentos, como metronidazol y disulfirm, pueden causar una reaccin desagradable cuando se usan con alcohol. Esta reaccin incluye enrojecimiento, dolor de cabeza, nuseas, vmitos, sudoracin y  aumento de la sed. La reaccin puede durar de 30 minutos a varias horas. Puede experimentar somnolencia o mareos. No conduzca, no utilice maquinaria ni haga nada que Associate Professor en estado de alerta hasta que sepa cmo le afecta este medicamento. No se siente ni se ponga de pie con rapidez, especialmente si es un paciente de edad avanzada. Esto reduce el riesgo de mareos o [employer]. El alcohol puede interferir con el efecto de este medicamento. Hable con su profesional de la salud sobre su riesgo de Hotel manager. Usted puede tener mayor riesgo para ciertos tipos de cncer si [place] este medicamento. No debe quedar embarazada mientras est usando este medicamento o por 6 meses despus de dejar de usarlo. Las mujeres deben informar a su mdico si estn buscando quedar embarazadas o si creen que podran estar embarazadas. Existe la posibilidad deEfectos secundarios graves en un beb sin nacer. Para obtener ms informacin, hable con su profesional de la salud o su farmacutico. No debe Economist a un beb mientras est usando este medicamento o por 1 semana despus de dejar de usarlo. Los hombres que reciben este medicamento deben usar un condn durante las relaciones sexuales con mujeres que puedan quedar embarazadas. Si usted embaraza a [employer], el beb podra tener defectos de nacimiento. El beb podra morir antes de Associate Professor. Deber seguir usando condn durante 3 meses despus de suspender el medicamento. Informe a su proveedor de Geophysical data processor de inmediato si su pareja queda embarazada mientras usted est [employer]. Esto puede interferir con la capacidad de los hombres para Social worker a Musician. Usted debe hablar con su mdico o su profesional de la salud si est preocupado por su  fertilidad. Qu efectos secundarios puedo tener al utilizar este medicamento? Efectos secundarios que debe informar a su mdico o a su profesional de la salud tan pronto como sea posible: reacciones alrgicas,  como erupcin cutnea, comezn/picazn o urticaria, e hinchazn de la cara, los labios o la lengua visin borrosa problemas para respirar cambios en la visin conteos sanguneos bajos: este frmaco podra reducir la cantidad de glbulos blancos, glbulos rojos y plaquetas. Su riesgo de infeccin y sangrado podra ser mayor. nuseas y vmito dolor, enrojecimiento o irritacin en el lugar de la inyeccin dolor, hormigueo o entumecimiento de las manos o los pies enrojecimiento, formacin de ampollas, descamacin o distensin de la piel, incluso dentro de la boca signos de disminucin en la cantidad de plaquetas o sangrado: moretones, puntos rojos en la piel, heces de color negro y aspecto alquitranado, sangrado por la nariz signos de disminucin en la cantidad de glbulos rojos: cansancio o debilidad inusual, desmayos, aturdimiento signos de infeccin: fiebre o escalofros, tos, dolor de garganta, dolor o dificultad para orinar hinchazn de tobillos, pies, manos Efectos secundarios que generalmente no requieren atencin mdica (infrmelos a su mdico o a su profesional de la salud si persisten o si son molestos): estreimiento diarrea cambios en las uas de las manos o de los pies cada del cabello prdida del apetito llagas en la boca dolor muscular Puede ser que esta lista no menciona todos los posibles efectos secundarios. Comunquese a su mdico por asesoramiento mdico sobre los efectos secundarios. Usted puede informar los efectos secundarios a la FDA por telfono al 1-800-FDA-1088. Dnde debo guardar mi medicina? Este medicamento se administra en hospitales o clnicas y no necesitar guardarlo en su domicilio. ATENCIN: Este folleto es un resumen. Puede ser que no cubra toda la posible informacin. Si usted tiene preguntas acerca de esta medicina, consulte con su mdico, su farmacutico o su profesional de la salud.  2020 Elsevier/Gold Standard (2019-03-31 00:00:00)    

## 2020-02-01 NOTE — Progress Notes (Signed)
Okay to treat with HR 107 per Dr. Jana Hakim.

## 2020-02-02 ENCOUNTER — Other Ambulatory Visit: Payer: Self-pay | Admitting: Oncology

## 2020-02-03 ENCOUNTER — Inpatient Hospital Stay: Payer: Medicaid Other

## 2020-02-03 ENCOUNTER — Other Ambulatory Visit: Payer: Self-pay

## 2020-02-03 ENCOUNTER — Telehealth: Payer: Self-pay | Admitting: *Deleted

## 2020-02-03 VITALS — BP 96/72 | HR 87 | Resp 18

## 2020-02-03 VITALS — BP 120/84 | HR 83

## 2020-02-03 DIAGNOSIS — Z5112 Encounter for antineoplastic immunotherapy: Secondary | ICD-10-CM | POA: Diagnosis not present

## 2020-02-03 DIAGNOSIS — C773 Secondary and unspecified malignant neoplasm of axilla and upper limb lymph nodes: Secondary | ICD-10-CM | POA: Diagnosis not present

## 2020-02-03 DIAGNOSIS — Z171 Estrogen receptor negative status [ER-]: Secondary | ICD-10-CM | POA: Diagnosis not present

## 2020-02-03 DIAGNOSIS — D6481 Anemia due to antineoplastic chemotherapy: Secondary | ICD-10-CM | POA: Diagnosis not present

## 2020-02-03 DIAGNOSIS — Z170 Estrogen receptor positive status [ER+]: Secondary | ICD-10-CM

## 2020-02-03 DIAGNOSIS — Z95828 Presence of other vascular implants and grafts: Secondary | ICD-10-CM

## 2020-02-03 DIAGNOSIS — C50411 Malignant neoplasm of upper-outer quadrant of right female breast: Secondary | ICD-10-CM | POA: Diagnosis not present

## 2020-02-03 DIAGNOSIS — Z5111 Encounter for antineoplastic chemotherapy: Secondary | ICD-10-CM | POA: Diagnosis not present

## 2020-02-03 DIAGNOSIS — Z79899 Other long term (current) drug therapy: Secondary | ICD-10-CM | POA: Diagnosis not present

## 2020-02-03 MED ORDER — SODIUM CHLORIDE 0.9% FLUSH
10.0000 mL | INTRAVENOUS | Status: DC | PRN
  Administered 2020-02-03: 10 mL
  Filled 2020-02-03: qty 10

## 2020-02-03 MED ORDER — HEPARIN SOD (PORK) LOCK FLUSH 100 UNIT/ML IV SOLN
500.0000 [IU] | Freq: Once | INTRAVENOUS | Status: AC | PRN
  Administered 2020-02-03: 500 [IU]
  Filled 2020-02-03: qty 5

## 2020-02-03 MED ORDER — PEGFILGRASTIM-JMDB 6 MG/0.6ML ~~LOC~~ SOSY
PREFILLED_SYRINGE | SUBCUTANEOUS | Status: AC
  Filled 2020-02-03: qty 0.6

## 2020-02-03 MED ORDER — PEGFILGRASTIM-JMDB 6 MG/0.6ML ~~LOC~~ SOSY
6.0000 mg | PREFILLED_SYRINGE | Freq: Once | SUBCUTANEOUS | Status: AC
  Administered 2020-02-03: 6 mg via SUBCUTANEOUS

## 2020-02-03 MED ORDER — SODIUM CHLORIDE 0.9 % IV SOLN
Freq: Once | INTRAVENOUS | Status: DC
  Filled 2020-02-03: qty 250

## 2020-02-03 NOTE — Telephone Encounter (Signed)
No note

## 2020-02-03 NOTE — Progress Notes (Signed)
Per Dr Jana Hakim ok to give injection today with low blood pressure. Patient will also be getting fluids today

## 2020-02-03 NOTE — Progress Notes (Signed)
Pt to get 1 liter of NS today along with injection.

## 2020-02-03 NOTE — Progress Notes (Signed)
Pt received 1L IVF NS over 2 hours today, tolerated well.  VS stable.  Reports feeling better at end of tx.  Able to eat/drink/ambulate as needed.

## 2020-02-03 NOTE — Patient Instructions (Signed)
Rehidratacin en los adultos Rehydration, Adult La rehidratacin es la reposicin de los lquidos, sales y minerales del cuerpo (electrolitos) que se pierden durante la deshidratacin. La deshidratacin se da cuando hay una cantidad insuficiente de lquido o agua en el organismo. Esto ocurre cuando se pierden ms lquidos de los que se ingieren. Entre las causas frecuentes de deshidratacin, se incluyen:  Vmitos.  Diarrea.  Sudoracin excesiva, por ejemplo, por la exposicin al calor o por la actividad fsica.  Tomar medicamentos que hacen que el cuerpo pierda el exceso de lquido (diurticos).  Deterioro de la funcin renal.  No beber la cantidad suficiente de lquidos.  Ciertas enfermedades o infecciones.  Ciertas enfermedades prolongadas (crnicas) que no estn bien tratadas, como diabetes, enfermedades cardacas o renales.  Los sntomas de la deshidratacin leve pueden incluir sed, sequedad de los labios, de la boca y de la piel, y mareos. Los sntomas de la deshidratacin grave pueden incluir aumento de la frecuencia cardaca, confusin, desmayos e imposibilidad de orinar. Puede rehidratarse bebiendo ciertos lquidos o recibindolos a travs de un tubo (catter) intravenoso, segn las indicaciones del mdico. Cules son los riesgos? Por lo general, la rehidratacin es segura. Sin embargo, un problema que puede surgir es la ingesta excesiva de lquido (hiperhidratacin). Esto es raro. Si se produce la hiperhidratacin, esta puede causar un desequilibrio de los electrolitos, insuficiencia renal o una disminucin de la concentracin de sal (sodio) en el cuerpo. Cmo rehidratarse Siga las indicaciones del mdico con respecto a la rehidratacin. El tipo y la cantidad de lquido que debe beber dependen de su afeccin.  Si el mdico se lo indic, beba una solucin de rehidratacin oral (SRO). Se trata de una bebida diseada para tratar la deshidratacin que puede encontrar en farmacias y  tiendas. ? Para preparar una SRO, siga las indicaciones del envase. ? Comience por beber pequeas cantidades, aproximadamente taza (120ml) cada 5 a 10minutos. ? Aumente lentamente la cantidad que bebe hasta que haya ingerido la cantidad recomendada por el mdico.  Beba suficientes lquidos transparentes para mantener la orina de color claro o amarillo plido. Si le indicaron que beba una SRO, termine la SRO primero y, luego, comience a beber de a poco otros lquidos transparentes. Beba lquidos, por ejemplo: ? Agua. No beba solo agua. Si bebe solo agua, puede disminuir mucho la cantidad de sodio en el cuerpo (hiponatremia). ? Cubitos de hielo. ? Jugo de frutas con agregado de agua (jugo diluido). ? Bebidas deportivas de bajas caloras.  Si est muy deshidratado, el mdico puede recomendarle que reciba lquidos a travs de un tubo (catter) intravenoso en el hospital.  No tome comprimidos de sodio. Esto puede causar la acumulacin excesiva de sodio en el organismo (hipernatremia). Alimentacin durante la rehidratacin Siga las indicaciones del mdico respecto de lo que puede comer mientras se rehidrata. El mdico puede recomendarle que comience a comer despacio alimentos habituales en pequeas cantidades.  Consuma los alimentos que contienen un equilibrio saludable de electrolitos, como las bananas, las naranjas, las papas, los tomates y la espinaca.  Evite los alimentos muy grasos o azucarados.  En algunos casos, puede alimentarse a travs de una sonda de alimentacin que se coloca a travs de la nariz hasta el estmago (sonda nasogstrica o sonda NG). Esto puede hacerse si tiene vmitos o diarrea que no pueden controlarse. Bebidas que se deben evitar Ciertas bebidas pueden empeorar la deshidratacin. Mientras se rehidrata, evite lo siguiente:  Alcohol.  Cafena.  Bebidas que contengan gran cantidad de azcar. Estas   incluyen las siguientes: ? Bebidas deportivas altas en  caloras. ? Jugo de frutas sin diluir. ? Gaseosas.  Verifique las etiquetas de los productos para saber cunta azcar o cafena contienen las bebidas. Signos de recuperacin de la deshidratacin Se puede estar recuperando de la deshidratacin si:  Orina con ms frecuencia que antes de comenzar la rehidratacin.  La orina es clara o de color amarillo plido.  Mejora el nivel de energa.  Vomita con menos frecuencia.  Tiene diarrea con menos frecuencia.  El apetito mejora o vuelve a la normalidad.  Se siente menos mareado o disminuye la sensacin de desvanecimiento.  El color y el tono de la piel comienzan a lucir ms normales. Comunquese con un mdico si:  Contina teniendo sntomas de deshidratacin leve, por ejemplo: ? Sed. ? Labios secos. ? Sequedad leve en la boca. ? Piel seca y caliente. ? Mareos.  Sigue con vmitos o diarrea. Solicite ayuda de inmediato si:  Tiene sntomas de deshidratacin que empeoran.  Presenta los siguientes sntomas: ? Confusin. ? Debilidad. ? Sensacin como si se fuera a desmayar.  No ha orinado en el trmino de 6 a 8horas.  La orina es muy oscura.  Tiene dificultad para respirar.  La frecuencia cardaca mientras est sentado quieto supera los 100latidos por minuto.  No puede beber lquidos sin vomitar.  Tiene vmitos o diarrea que: ? Empeoran. ? Persisten.  Tiene fiebre. Esta informacin no tiene como fin reemplazar el consejo del mdico. Asegrese de hacerle al mdico cualquier pregunta que tenga. Document Revised: 10/09/2016 Document Reviewed: 07/21/2015 Elsevier Patient Education  2020 Elsevier Inc.  

## 2020-02-03 NOTE — Patient Instructions (Signed)

## 2020-02-08 ENCOUNTER — Inpatient Hospital Stay: Payer: Medicaid Other

## 2020-02-08 ENCOUNTER — Other Ambulatory Visit: Payer: Self-pay | Admitting: *Deleted

## 2020-02-08 ENCOUNTER — Other Ambulatory Visit: Payer: Self-pay

## 2020-02-08 DIAGNOSIS — Z5111 Encounter for antineoplastic chemotherapy: Secondary | ICD-10-CM | POA: Diagnosis not present

## 2020-02-08 DIAGNOSIS — D649 Anemia, unspecified: Secondary | ICD-10-CM

## 2020-02-08 DIAGNOSIS — C50411 Malignant neoplasm of upper-outer quadrant of right female breast: Secondary | ICD-10-CM | POA: Diagnosis not present

## 2020-02-08 DIAGNOSIS — Z5112 Encounter for antineoplastic immunotherapy: Secondary | ICD-10-CM | POA: Diagnosis not present

## 2020-02-08 DIAGNOSIS — Z95828 Presence of other vascular implants and grafts: Secondary | ICD-10-CM

## 2020-02-08 DIAGNOSIS — Z171 Estrogen receptor negative status [ER-]: Secondary | ICD-10-CM | POA: Diagnosis not present

## 2020-02-08 DIAGNOSIS — C773 Secondary and unspecified malignant neoplasm of axilla and upper limb lymph nodes: Secondary | ICD-10-CM | POA: Diagnosis not present

## 2020-02-08 DIAGNOSIS — Z170 Estrogen receptor positive status [ER+]: Secondary | ICD-10-CM

## 2020-02-08 DIAGNOSIS — Z79899 Other long term (current) drug therapy: Secondary | ICD-10-CM | POA: Diagnosis not present

## 2020-02-08 DIAGNOSIS — D6481 Anemia due to antineoplastic chemotherapy: Secondary | ICD-10-CM | POA: Diagnosis not present

## 2020-02-08 LAB — PREGNANCY, URINE: Preg Test, Ur: NEGATIVE

## 2020-02-08 LAB — CBC WITH DIFFERENTIAL/PLATELET
Abs Immature Granulocytes: 0 10*3/uL (ref 0.00–0.07)
Basophils Absolute: 0 10*3/uL (ref 0.0–0.1)
Basophils Relative: 1 %
Eosinophils Absolute: 0 10*3/uL (ref 0.0–0.5)
Eosinophils Relative: 1 %
HCT: 22.6 % — ABNORMAL LOW (ref 36.0–46.0)
Hemoglobin: 7.4 g/dL — ABNORMAL LOW (ref 12.0–15.0)
Immature Granulocytes: 0 %
Lymphocytes Relative: 64 %
Lymphs Abs: 1 10*3/uL (ref 0.7–4.0)
MCH: 31.5 pg (ref 26.0–34.0)
MCHC: 32.7 g/dL (ref 30.0–36.0)
MCV: 96.2 fL (ref 80.0–100.0)
Monocytes Absolute: 0.2 10*3/uL (ref 0.1–1.0)
Monocytes Relative: 14 %
Neutro Abs: 0.3 10*3/uL — CL (ref 1.7–7.7)
Neutrophils Relative %: 20 %
Platelets: 66 10*3/uL — ABNORMAL LOW (ref 150–400)
RBC: 2.35 MIL/uL — ABNORMAL LOW (ref 3.87–5.11)
RDW: 16.7 % — ABNORMAL HIGH (ref 11.5–15.5)
WBC: 1.5 10*3/uL — ABNORMAL LOW (ref 4.0–10.5)
nRBC: 0 % (ref 0.0–0.2)

## 2020-02-08 LAB — COMPREHENSIVE METABOLIC PANEL
ALT: 56 U/L — ABNORMAL HIGH (ref 0–44)
AST: 29 U/L (ref 15–41)
Albumin: 3.6 g/dL (ref 3.5–5.0)
Alkaline Phosphatase: 149 U/L — ABNORMAL HIGH (ref 38–126)
Anion gap: 8 (ref 5–15)
BUN: 10 mg/dL (ref 6–20)
CO2: 26 mmol/L (ref 22–32)
Calcium: 9.2 mg/dL (ref 8.9–10.3)
Chloride: 102 mmol/L (ref 98–111)
Creatinine, Ser: 0.7 mg/dL (ref 0.44–1.00)
GFR calc Af Amer: >60 mL/min (ref >60)
GFR calc non Af Amer: >60 mL/min (ref >60)
Glucose, Bld: 106 mg/dL — ABNORMAL HIGH (ref 70–99)
Potassium: 3.9 mmol/L (ref 3.5–5.1)
Sodium: 136 mmol/L (ref 135–145)
Total Bilirubin: 0.9 mg/dL (ref 0.3–1.2)
Total Protein: 6.7 g/dL (ref 6.5–8.1)

## 2020-02-08 LAB — SAMPLE TO BLOOD BANK

## 2020-02-08 LAB — PREPARE RBC (CROSSMATCH)

## 2020-02-08 MED ORDER — SODIUM CHLORIDE 0.9% IV SOLUTION
250.0000 mL | Freq: Once | INTRAVENOUS | Status: AC
  Administered 2020-02-08: 250 mL via INTRAVENOUS
  Filled 2020-02-08: qty 250

## 2020-02-08 MED ORDER — ACETAMINOPHEN 325 MG PO TABS
650.0000 mg | ORAL_TABLET | Freq: Once | ORAL | Status: AC
  Administered 2020-02-08: 650 mg via ORAL

## 2020-02-08 MED ORDER — HEPARIN SOD (PORK) LOCK FLUSH 100 UNIT/ML IV SOLN
500.0000 [IU] | Freq: Every day | INTRAVENOUS | Status: AC | PRN
  Administered 2020-02-08: 500 [IU]
  Filled 2020-02-08: qty 5

## 2020-02-08 MED ORDER — ACETAMINOPHEN 325 MG PO TABS
ORAL_TABLET | ORAL | Status: AC
  Filled 2020-02-08: qty 2

## 2020-02-08 MED ORDER — DIPHENHYDRAMINE HCL 25 MG PO CAPS
25.0000 mg | ORAL_CAPSULE | Freq: Once | ORAL | Status: AC
  Administered 2020-02-08: 25 mg via ORAL

## 2020-02-08 MED ORDER — DIPHENHYDRAMINE HCL 25 MG PO CAPS
ORAL_CAPSULE | ORAL | Status: AC
  Filled 2020-02-08: qty 1

## 2020-02-08 MED ORDER — SODIUM CHLORIDE 0.9% FLUSH
10.0000 mL | INTRAVENOUS | Status: DC | PRN
  Administered 2020-02-08: 10 mL
  Filled 2020-02-08: qty 10

## 2020-02-08 MED ORDER — SODIUM CHLORIDE 0.9% FLUSH
10.0000 mL | INTRAVENOUS | Status: AC | PRN
  Administered 2020-02-08: 10 mL
  Filled 2020-02-08: qty 10

## 2020-02-08 NOTE — Patient Instructions (Signed)
Transfusin de sangre en los adultos, cuidados posteriores Blood Transfusion, Adult, Care After Esta hoja le brinda informacin sobre cmo cuidarse despus del procedimiento. El mdico tambin podr darle instrucciones ms especficas. Si tiene problemas o preguntas, llame al mdico. Qu puedo esperar despus del procedimiento? Despus del procedimiento, es normal tener los siguientes sntomas:  Hematomas y dolor en el lugar de la va intravenosa (i.v.).  Fiebre o escalofros el da del procedimiento. Esta puede ser la respuesta del cuerpo a las nuevas clulas sanguneas recibidas.  Dolor de cabeza. Siga estas instrucciones en su casa: Cuidados del lugar de la insercin      Siga las instrucciones del mdico en lo que respecta al cuidado del lugar de insercin. Este es el lugar donde se coloc un tubo (catter) intravenoso en la vena. Asegrese de hacer lo siguiente: ? Lvese las manos con agua y jabn antes y despus de cambiar la venda (vendaje). Use un desinfectante para manos si no dispone de agua y jabn. ? Cambie las vendas como se lo haya indicado el mdico.  Controle el lugar de insercin todos los das para detectar signos de infeccin. Preste atencin a los siguientes signos: ? Dolor, hinchazn o enrojecimiento. ? Sangrado proveniente del lugar. ? Calor. ? Pus o mal olor. Instrucciones generales  Use los medicamentos de venta libre y los recetados solamente como se lo haya indicado el mdico.  Haga reposo como se lo haya indicado el mdico.  Retome sus actividades habituales como se lo haya indicado el mdico.  Concurra a todas las visitas de seguimiento como se lo haya indicado el mdico. Esto es importante. Comunquese con un mdico si:  Tiene picazn o zonas enrojecidas e hinchadas en la piel (urticaria).  Est preocupado o nervioso (ansioso).  Se siente dbil despus de realizar sus actividades habituales.  Tiene enrojecimiento, hinchazn, calor o dolor  alrededor del lugar de la insercin.  Observa sangre que sale del lugar de la insercin que no se detiene al ejercer presin.  Tiene pus o percibe mal olor que proviene del lugar de la insercin. Solicite ayuda de inmediato si:  Tiene signos de una reaccin grave. Puede deberse a una alergia o provenir del sistema de defensa del cuerpo (sistema inmunitario). Algunos signos son los siguientes: ? Dificultad para respirar o falta de aire. ? Hinchazn en la cara o sensacin de calor (sofoco). ? Fiebre o escalofros. ? Dolor de cabeza, de pecho o de espalda. ? Pis (orina) de color oscuro o sangre en el pis. ? Erupcin cutnea generalizada. ? Latidos cardacos acelerados. ? Sentirse mareado o aturdido. Es posible que reciba la transfusin de sangre en un entorno ambulatorio. En tal caso, le indicarn con quin se debe poner en contacto para informar cualquier reaccin. Estos sntomas pueden indicar una emergencia. No espere a ver si los sntomas desaparecen. Solicite atencin mdica de inmediato. Comunquese con el servicio de emergencias de su localidad (911 en los [place]). No conduzca por sus propios medios hasta el hospital. Resumen  Es normal tener moretones y dolor en el lugar donde se coloc la va intravenosa (i.v.).  Controle el lugar de insercin todos los das para detectar signos de infeccin.  Haga reposo como se lo haya indicado el mdico. Retome sus actividades habituales como se lo haya indicado el mdico.  Obtenga ayuda de inmediato si tiene signos de una reaccin grave. Esta informacin no tiene como fin reemplazar el consejo del mdico. Asegrese de hacerle al mdico cualquier pregunta que tenga. Document   Revised: 01/12/2019 Document Reviewed: 01/12/2019 Elsevier Patient Education  2020 Elsevier Inc.  

## 2020-02-09 ENCOUNTER — Ambulatory Visit
Admission: RE | Admit: 2020-02-09 | Discharge: 2020-02-09 | Disposition: A | Discharge status: Home / Self Care | Payer: Medicaid Other | Source: Ambulatory Visit | Attending: Oncology | Admitting: Oncology

## 2020-02-09 ENCOUNTER — Other Ambulatory Visit: Payer: Medicaid Other

## 2020-02-09 DIAGNOSIS — C50511 Malignant neoplasm of lower-outer quadrant of right female breast: Secondary | ICD-10-CM | POA: Diagnosis not present

## 2020-02-09 DIAGNOSIS — Z170 Estrogen receptor positive status [ER+]: Secondary | ICD-10-CM

## 2020-02-09 DIAGNOSIS — C773 Secondary and unspecified malignant neoplasm of axilla and upper limb lymph nodes: Secondary | ICD-10-CM | POA: Diagnosis not present

## 2020-02-09 DIAGNOSIS — Z419 Encounter for procedure for purposes other than remedying health state, unspecified: Secondary | ICD-10-CM | POA: Diagnosis not present

## 2020-02-09 LAB — BPAM RBC
Blood Product Expiration Date: 202109292359
ISSUE DATE / TIME: 202108311050
Unit Type and Rh: 5100

## 2020-02-09 LAB — TYPE AND SCREEN
ABO/RH(D): O POS
Antibody Screen: NEGATIVE
Unit division: 0

## 2020-02-09 MED ORDER — GADOBUTROL 1 MMOL/ML IV SOLN
10.0000 mL | Freq: Once | INTRAVENOUS | Status: AC | PRN
  Administered 2020-02-09: 10 mL via INTRAVENOUS

## 2020-02-10 ENCOUNTER — Other Ambulatory Visit: Payer: Self-pay | Admitting: Student

## 2020-02-10 DIAGNOSIS — R1030 Lower abdominal pain, unspecified: Secondary | ICD-10-CM

## 2020-02-11 ENCOUNTER — Encounter: Payer: Self-pay | Admitting: *Deleted

## 2020-02-16 ENCOUNTER — Other Ambulatory Visit: Payer: Self-pay | Admitting: Surgery

## 2020-02-16 ENCOUNTER — Encounter: Payer: Self-pay | Admitting: *Deleted

## 2020-02-16 ENCOUNTER — Other Ambulatory Visit: Payer: Self-pay | Admitting: Hematology and Oncology

## 2020-02-16 DIAGNOSIS — C50911 Malignant neoplasm of unspecified site of right female breast: Secondary | ICD-10-CM | POA: Diagnosis not present

## 2020-02-16 DIAGNOSIS — I10 Essential (primary) hypertension: Secondary | ICD-10-CM | POA: Diagnosis not present

## 2020-02-16 DIAGNOSIS — Z170 Estrogen receptor positive status [ER+]: Secondary | ICD-10-CM

## 2020-02-16 DIAGNOSIS — C50411 Malignant neoplasm of upper-outer quadrant of right female breast: Secondary | ICD-10-CM

## 2020-02-17 ENCOUNTER — Other Ambulatory Visit: Payer: Self-pay | Admitting: Hematology

## 2020-02-21 ENCOUNTER — Telehealth: Payer: Self-pay | Admitting: *Deleted

## 2020-02-21 NOTE — Telephone Encounter (Signed)
Spoke with patient through interpreter services for translation to clarify some questions she had regarding appointments.  She thought she was supposed to start xrt tomorrow and informed that the appointment for tomorrow is for her maintenance herceptin/perjeta and explained to her that xrt will come after she has healed from surgery which will probably be around the end of October because her sx date is 9/27. Informed her of the appointment time tomorrow and patient verbalized understanding.

## 2020-02-22 ENCOUNTER — Other Ambulatory Visit: Payer: Medicaid Other

## 2020-02-22 ENCOUNTER — Inpatient Hospital Stay: Payer: Medicaid Other

## 2020-02-22 ENCOUNTER — Other Ambulatory Visit: Payer: Self-pay

## 2020-02-22 ENCOUNTER — Encounter: Payer: Self-pay | Admitting: *Deleted

## 2020-02-22 ENCOUNTER — Ambulatory Visit: Payer: Medicaid Other

## 2020-02-22 ENCOUNTER — Inpatient Hospital Stay: Payer: Medicaid Other | Attending: Oncology

## 2020-02-22 VITALS — BP 110/80 | HR 89 | Temp 98.0°F | Resp 18

## 2020-02-22 DIAGNOSIS — Z79899 Other long term (current) drug therapy: Secondary | ICD-10-CM | POA: Diagnosis not present

## 2020-02-22 DIAGNOSIS — Z5112 Encounter for antineoplastic immunotherapy: Secondary | ICD-10-CM | POA: Diagnosis not present

## 2020-02-22 DIAGNOSIS — C50411 Malignant neoplasm of upper-outer quadrant of right female breast: Secondary | ICD-10-CM | POA: Diagnosis not present

## 2020-02-22 DIAGNOSIS — Z170 Estrogen receptor positive status [ER+]: Secondary | ICD-10-CM

## 2020-02-22 DIAGNOSIS — Z95828 Presence of other vascular implants and grafts: Secondary | ICD-10-CM

## 2020-02-22 DIAGNOSIS — Z171 Estrogen receptor negative status [ER-]: Secondary | ICD-10-CM | POA: Diagnosis not present

## 2020-02-22 DIAGNOSIS — Z5189 Encounter for other specified aftercare: Secondary | ICD-10-CM | POA: Diagnosis not present

## 2020-02-22 DIAGNOSIS — C773 Secondary and unspecified malignant neoplasm of axilla and upper limb lymph nodes: Secondary | ICD-10-CM | POA: Diagnosis not present

## 2020-02-22 LAB — CBC WITH DIFFERENTIAL/PLATELET
Abs Immature Granulocytes: 0.02 10*3/uL (ref 0.00–0.07)
Basophils Absolute: 0 10*3/uL (ref 0.0–0.1)
Basophils Relative: 0 %
Eosinophils Absolute: 0 10*3/uL (ref 0.0–0.5)
Eosinophils Relative: 0 %
HCT: 25.2 % — ABNORMAL LOW (ref 36.0–46.0)
Hemoglobin: 8.1 g/dL — ABNORMAL LOW (ref 12.0–15.0)
Immature Granulocytes: 1 %
Lymphocytes Relative: 31 %
Lymphs Abs: 1.3 10*3/uL (ref 0.7–4.0)
MCH: 31.8 pg (ref 26.0–34.0)
MCHC: 32.1 g/dL (ref 30.0–36.0)
MCV: 98.8 fL (ref 80.0–100.0)
Monocytes Absolute: 0.4 10*3/uL (ref 0.1–1.0)
Monocytes Relative: 9 %
Neutro Abs: 2.5 10*3/uL (ref 1.7–7.7)
Neutrophils Relative %: 59 %
Platelets: 101 10*3/uL — ABNORMAL LOW (ref 150–400)
RBC: 2.55 MIL/uL — ABNORMAL LOW (ref 3.87–5.11)
RDW: 17.7 % — ABNORMAL HIGH (ref 11.5–15.5)
WBC: 4.3 10*3/uL (ref 4.0–10.5)
nRBC: 0 % (ref 0.0–0.2)

## 2020-02-22 LAB — COMPREHENSIVE METABOLIC PANEL
ALT: 20 U/L (ref 0–44)
AST: 17 U/L (ref 15–41)
Albumin: 3.4 g/dL — ABNORMAL LOW (ref 3.5–5.0)
Alkaline Phosphatase: 110 U/L (ref 38–126)
Anion gap: 8 (ref 5–15)
BUN: 9 mg/dL (ref 6–20)
CO2: 28 mmol/L (ref 22–32)
Calcium: 7.4 mg/dL — ABNORMAL LOW (ref 8.9–10.3)
Chloride: 102 mmol/L (ref 98–111)
Creatinine, Ser: 0.7 mg/dL (ref 0.44–1.00)
GFR calc Af Amer: >60 mL/min (ref >60)
GFR calc non Af Amer: >60 mL/min (ref >60)
Glucose, Bld: 99 mg/dL (ref 70–99)
Potassium: 3.4 mmol/L — ABNORMAL LOW (ref 3.5–5.1)
Sodium: 138 mmol/L (ref 135–145)
Total Bilirubin: 0.5 mg/dL (ref 0.3–1.2)
Total Protein: 6.6 g/dL (ref 6.5–8.1)

## 2020-02-22 LAB — PREGNANCY, URINE: Preg Test, Ur: NEGATIVE

## 2020-02-22 MED ORDER — ALTEPLASE 2 MG IJ SOLR
INTRAMUSCULAR | Status: AC
  Filled 2020-02-22: qty 2

## 2020-02-22 MED ORDER — TRASTUZUMAB-DKST CHEMO 150 MG IV SOLR
6.0000 mg/kg | Freq: Once | INTRAVENOUS | Status: AC
  Administered 2020-02-22: 525 mg via INTRAVENOUS
  Filled 2020-02-22: qty 25

## 2020-02-22 MED ORDER — SODIUM CHLORIDE 0.9 % IV SOLN
Freq: Once | INTRAVENOUS | Status: AC
  Filled 2020-02-22: qty 250

## 2020-02-22 MED ORDER — ALTEPLASE 2 MG IJ SOLR
2.0000 mg | Freq: Once | INTRAMUSCULAR | Status: AC | PRN
  Administered 2020-02-22: 2 mg
  Filled 2020-02-22: qty 2

## 2020-02-22 MED ORDER — ACETAMINOPHEN 325 MG PO TABS
650.0000 mg | ORAL_TABLET | Freq: Once | ORAL | Status: AC
  Administered 2020-02-22: 650 mg via ORAL

## 2020-02-22 MED ORDER — SODIUM CHLORIDE 0.9% FLUSH
10.0000 mL | INTRAVENOUS | Status: DC | PRN
  Administered 2020-02-22: 10 mL
  Filled 2020-02-22: qty 10

## 2020-02-22 MED ORDER — DIPHENHYDRAMINE HCL 25 MG PO CAPS
50.0000 mg | ORAL_CAPSULE | Freq: Once | ORAL | Status: AC
  Administered 2020-02-22: 50 mg via ORAL

## 2020-02-22 MED ORDER — SODIUM CHLORIDE 0.9 % IV SOLN
420.0000 mg | Freq: Once | INTRAVENOUS | Status: AC
  Administered 2020-02-22: 420 mg via INTRAVENOUS
  Filled 2020-02-22: qty 14

## 2020-02-22 MED ORDER — ACETAMINOPHEN 325 MG PO TABS
ORAL_TABLET | ORAL | Status: AC
  Filled 2020-02-22: qty 2

## 2020-02-22 MED ORDER — HEPARIN SOD (PORK) LOCK FLUSH 100 UNIT/ML IV SOLN
500.0000 [IU] | Freq: Once | INTRAVENOUS | Status: AC | PRN
  Administered 2020-02-22: 500 [IU]
  Filled 2020-02-22: qty 5

## 2020-02-22 MED ORDER — DIPHENHYDRAMINE HCL 25 MG PO CAPS
ORAL_CAPSULE | ORAL | Status: AC
  Filled 2020-02-22: qty 2

## 2020-02-22 NOTE — Patient Instructions (Signed)
[facility] Discharge Instructions for Patients Receiving Chemotherapy  Today you received the following chemotherapy agents: trastuzumab and pertuzumab.  To help prevent nausea and vomiting after your treatment, we encourage you to take your nausea medication as directed.   If you develop nausea and vomiting that is not controlled by your nausea medication, call the clinic.   BELOW ARE SYMPTOMS THAT SHOULD BE REPORTED IMMEDIATELY:  *FEVER GREATER THAN 100.5 F  *CHILLS WITH OR WITHOUT FEVER  NAUSEA AND VOMITING THAT IS NOT CONTROLLED WITH YOUR NAUSEA MEDICATION  *UNUSUAL SHORTNESS OF BREATH  *UNUSUAL BRUISING OR BLEEDING  TENDERNESS IN MOUTH AND THROAT WITH OR WITHOUT PRESENCE OF ULCERS  *URINARY PROBLEMS  *BOWEL PROBLEMS  UNUSUAL RASH Items with * indicate a potential emergency and should be followed up as soon as possible.  Feel free to call the clinic should you have any questions or concerns. The clinic phone number is [phone].  Please show the CHEMO ALERT CARD at check-in to the Emergency Department and triage nurse.   

## 2020-02-29 ENCOUNTER — Encounter (HOSPITAL_COMMUNITY): Payer: Self-pay

## 2020-02-29 NOTE — Patient Instructions (Addendum)
DUE TO COVID-19 ONLY ONE VISITOR IS ALLOWED TO COME WITH YOU AND STAY IN THE WAITING ROOM ONLY  DURING PRE OP AND PROCEDURE.   IF YOU WILL BE ADMITTED INTO THE HOSPITAL YOU ARE ALLOWED ONE SUPPORT PERSON DURING VISITATION  HOURS ONLY (10AM -8PM)   . The support person may change daily. . The support person must pass our screening, gel in and out, and wear a mask at all times, including in the patient's room. . Patients must also wear a mask when staff or their support person are in the room.   COVID SWAB TESTING MUST BE COMPLETED ON:  Thursday, 03-02-20 @ 8:25 AM   [address] [place], [place] [address]  (Must self quarantine after testing. Follow instructions on  handout.)        Your procedure is scheduled on:  Monday, 03-06-20   Report to [facility] Main  Entrance   Report to Short Stay at 5:30 AM   [facility])      Call this number if you have problems the morning of surgery [phone]   Do not eat food :After Midnight.   May have liquids until 4:30 AM day of surgery  CLEAR LIQUID DIET  Foods Allowed                                                                     Foods Excluded  Water, Black Coffee and tea, regular and decaf           liquids that you cannot  Plain Jell-O in any flavor  (No red)                                 see through such as: Fruit ices (not with fruit pulp)                                      milk, soups, orange juice              Iced Popsicles (No red)                                      All solid food                                   Apple juices Sports drinks like Gatorade (No red) Lightly seasoned clear broth or consume(fat free)  Sugar, honey syrup       Oral Hygiene is also important to reduce your risk of infection.                                    Remember - BRUSH YOUR TEETH THE MORNING OF SURGERY WITH YOUR REGULAR TOOTHPASTE   Do NOT smoke after Midnight   Take these medicines the morning of surgery with A SIP  OF WATER:  None  You may not have any metal on your body including hair pins, jewelry, and body piercings             Do not wear make-up, lotions, powders, perfumes/cologne, or deodorant             Do not wear nail polish.  Do not shave  48 hours prior to surgery.                Do not bring valuables to the hospital. [place].   Contacts, dentures or bridgework may not be worn into surgery.   Bring small overnight bag day of surgery.                   Please read over the following fact sheets you were given: IF YOU HAVE QUESTIONS ABOUT YOUR PRE OP INSTRUCTIONS PLEASE CALL [phone]      - Preparing for Surgery Before surgery, you can play an important role.  Because skin is not sterile, your skin needs to be as free of germs as possible.  You can reduce the number of germs on your skin by washing with CHG (chlorahexidine gluconate) soap before surgery.  CHG is an antiseptic cleaner which kills germs and bonds with the skin to continue killing germs even after washing. Please DO NOT use if you have an allergy to CHG or antibacterial soaps.  If your skin becomes reddened/irritated stop using the CHG and inform your nurse when you arrive at Short Stay. Do not shave (including legs and underarms) for at least 48 hours prior to the first CHG shower.  You may shave your face/neck.  Please follow these instructions carefully:  1.  Shower with CHG Soap the night before surgery and the  morning of surgery.  2.  If you choose to wash your hair, wash your hair first as usual with your normal  shampoo.  3.  After you shampoo, rinse your hair and body thoroughly to remove the shampoo.                             4.  Use CHG as you would any other liquid soap.  You can apply chg directly to the skin and wash.  Gently with a scrungie or clean washcloth.  5.  Apply the CHG Soap to your body ONLY FROM THE NECK DOWN.    Do   not use on face/ open                           Wound or open sores. Avoid contact with eyes, ears mouth and   genitals (private parts).                       Wash face,  Genitals (private parts) with your normal soap.             6.  Wash thoroughly, paying special attention to the area where your    surgery  will be performed.  7.  Thoroughly rinse your body with warm water from the neck down.  8.  DO NOT shower/wash with your normal soap after using and rinsing off the CHG Soap.                9.  Pat yourself dry with a clean towel.  10.  Wear clean pajamas.            11.  Place clean sheets on your bed the night of your first shower and do not  sleep with pets. Day of Surgery : Do not apply any lotions/deodorants the morning of surgery.  Please wear clean clothes to the hospital/surgery center.  FAILURE TO FOLLOW THESE INSTRUCTIONS MAY RESULT IN THE CANCELLATION OF YOUR SURGERY  PATIENT SIGNATURE_________________________________  NURSE SIGNATURE__________________________________  ________________________________________________________________________

## 2020-02-29 NOTE — Progress Notes (Addendum)
COVID Vaccine Completed:  No Date COVID Vaccine completed: COVID vaccine manufacturer: [place]   PCP - Vanita Panda, MD Cardiologist -   Chest x-ray - CT chest 10-15-19 in Epic EKG - 03-02-20 in Epic Stress Test -  ECHO - 10-15-19 in Epic Cardiac Cath -  Pacemaker/ICD device last checked:  Sleep Study -  CPAP -   Fasting Blood Sugar -  Checks Blood Sugar _____ times a day  Blood Thinner Instructions: Aspirin Instructions: Last Dose:  Anesthesia review:   Patient denies shortness of breath, fever, cough and chest pain at PAT appointment   Patient verbalized understanding of instructions that were given to them at the PAT appointment. Patient was also instructed that they will need to review over the PAT instructions again at home before surgery.

## 2020-03-02 ENCOUNTER — Other Ambulatory Visit: Payer: Self-pay

## 2020-03-02 ENCOUNTER — Other Ambulatory Visit (HOSPITAL_COMMUNITY)
Admission: RE | Admit: 2020-03-02 | Discharge: 2020-03-02 | Disposition: A | Discharge status: Home / Self Care | Payer: Medicaid Other | Source: Ambulatory Visit

## 2020-03-02 ENCOUNTER — Encounter (HOSPITAL_COMMUNITY): Payer: Self-pay

## 2020-03-02 ENCOUNTER — Encounter (HOSPITAL_COMMUNITY)
Admission: RE | Admit: 2020-03-02 | Discharge: 2020-03-02 | Disposition: A | Discharge status: Home / Self Care | Payer: Medicaid Other | Source: Ambulatory Visit | Attending: Surgery | Admitting: Surgery

## 2020-03-02 DIAGNOSIS — Z01818 Encounter for other preprocedural examination: Secondary | ICD-10-CM | POA: Diagnosis not present

## 2020-03-02 DIAGNOSIS — Z20822 Contact with and (suspected) exposure to covid-19: Secondary | ICD-10-CM | POA: Insufficient documentation

## 2020-03-02 LAB — CBC
HCT: 28.9 % — ABNORMAL LOW (ref 36.0–46.0)
Hemoglobin: 9.3 g/dL — ABNORMAL LOW (ref 12.0–15.0)
MCH: 33.3 pg (ref 26.0–34.0)
MCHC: 32.2 g/dL (ref 30.0–36.0)
MCV: 103.6 fL — ABNORMAL HIGH (ref 80.0–100.0)
Platelets: 330 10*3/uL (ref 150–400)
RBC: 2.79 MIL/uL — ABNORMAL LOW (ref 3.87–5.11)
RDW: 19.4 % — ABNORMAL HIGH (ref 11.5–15.5)
WBC: 6.9 10*3/uL (ref 4.0–10.5)
nRBC: 0 % (ref 0.0–0.2)

## 2020-03-02 LAB — SARS CORONAVIRUS 2 (TAT 6-24 HRS): SARS Coronavirus 2: NEGATIVE

## 2020-03-02 NOTE — Progress Notes (Signed)
CBC sent to Dr. Lucia Gaskins to review.

## 2020-03-05 NOTE — H&P (Signed)
Alice [name]  In [place]  Location: [facility] Patient #: [id number] DOB: 1976/05/06 Married / Language: Undefined / Race: Refused to Report/Unreported Female  History of Present Illness   The patient is a 44 year old female who presents with a complaint of breast cancer.  [note: in Epic she is Alice [name]  The PCP is [facility] on [address] ([facility]?)  The patient is at the [facility] is Drs. Magrinat and Lisbeth Renshaw She speaks no [place]. We used the interpreter on the phone to assist. He was good.  I last saw her on 10/06/2019 at the [facility]. She has undergone neoadjuvant chemotx. She was supposed to see me during her treatment, but this did not happen. She was presented at the Breast Cancer Conference on 02/16/2020. Dr. Lindi Adie said that she would be a candidate for [facility] Her2 trial. She had a breast MRI on 02/09/2020:  1. Treatment response of UPPER-OUTER RIGHT breast malignancy exhibiting improved enhancement characteristics and now measuring 3 x 1.5 x 1.5 cm, previously 4 x 3.6 x 1.9 cm 2. Unchanged indeterminate 1.1 cm mass within the anterior LOWER central RIGHT breast and new 0.7 cm mass within the anterior central/RETROAREOLAR RIGHT breast. It would be difficult to definitively identify these 2 masses sonographically and therefore MR guided biopsies of both of these areas recommended if breast conservation is desired. 3. No abnormal appearing lymph nodes. 4. No MR evidence of LEFT breast malignancy. (I gave her a copy of report.)  She appears to have had a good response to the neoadjuvant chemotherapy. I still think she is best served with a right mastectomy. I explained the surgery to her. I gave her a book in Spanish on breast cancer and mastectomies. She was under the assumption that she started radiation therapy mid-September. I told her that we would  do surgery first, then plan on radiation therapy to her right chest after the surgery. I explained the mastectomy to her. I talked about keeping her at least one night in the hospital. I talked about having some drains in place that will require follow-up in our office after surgery. I think she understands this. She had no further questions.  Plan: 1. Completed neoadjuvant chemotx, 2. right mastectomy/right axillary node dissection (post chemotx)  Past Medical History: 1. Right breast cancer ([facility]) Mammograms: [facility] - Right breast skin dimpling, 3.3 cm architectural distortion, but abnormality covers 7 cm. She also has multiple abnormal right axillary lymph nodes Right breast biopsy - 09/30/2019 ([id number]) - IDC, ER - 0%, PR - 0%, Ki67 - 80-%, Her2Neu - POSITIVE, right axillary lymph node - POSITIVE (Stage IIIA) Neoadjuvant chemotx - trastuzumab, pertuzumab, docetaxel and carboplatin 2. HTN 3. GB - 2008  Social History: Married. Alice [name] She has two children: Alice [name] 58 yo, [place] - 44 yo She is from the [place]. She moved here 2 years ago. She is not working.  Allergies (April Staton, CMA; 02/16/2020 9:55 AM) No Known Drug Allergies  [02/16/2020]:  Medication History (April Staton, CMA; 02/16/2020 9:56 AM) Dexamethasone (4MG  Tablet, Oral) Active. Ibuprofen (200MG  Tablet, Oral) Active. Lisinopril (10MG  Tablet, Oral) Active. Loratadine (10MG  Tablet, Oral) Active. Prochlorperazine Maleate (10MG  Tablet, Oral) Active. LORazepam (0.5MG  Tablet, Oral) Active. Medications Reconciled  Vitals (April Staton CMA; 02/16/2020 9:57 AM) 02/16/2020 9:56 AM Weight: 198.13 lb Height: 63in Body Surface Area: 1.93 m Body Mass Index: 35.1 kg/m  Temp.: 97.69F (Temporal)  Pulse: 122 (Regular)  P.OX: 97% (Room air) BP: 120/80(Sitting,  Left Arm, Standard)  Physical Exam  General: WN mildly obese Hispanic F alert and generally  healthy appearing. HEENT: Normal. Pupils equal.  Neck: Supple. No mass. No thyroid mass.  Lymph Nodes: No supraclavicular or cervical nodes. The right axillary node I could feel before, I cannot feel now.  Lungs: Clear to auscultation and symmetric breath sounds. Heart: RRR. No murmur or rub.  Breast: Right - The peau de orange changes, the thickened breast in the right breast are all better with no obvious palpable mass. Large breast Left - large breast  Extremities: Good strength and ROM in upper and lower extremities.   Assessment & Plan  1.  BREAST CANCER, STAGE 3, RIGHT (C50.911)  Story: Right breast biopsy - 09/30/2019 ([id number]) - IDC, ER - 0%, PR - 0%, Ki67 - 80-%, Her2Neu - positive, right axillary lymph node - POSITIVE (Stage IIIA)  Oncology - Magrinat and [place]  1. Right mastectomy/right axillary node dissection (post chemotx),  2.  HYPERTENSION (I10)   Alphonsa Overall, MD, [facility] [place] [facility] Office phone:  [phone]

## 2020-03-05 NOTE — Anesthesia Preprocedure Evaluation (Addendum)
Anesthesia Evaluation  Patient identified by MRN, date of birth, ID band Patient awake    Reviewed: Allergy & Precautions, NPO status , Patient's Chart, lab work & pertinent test results  Airway Mallampati: II  TM Distance: >3 FB Neck ROM: Full    Dental  (+) Dental Advisory Given   Pulmonary neg pulmonary ROS,    Pulmonary exam normal breath sounds clear to auscultation       Cardiovascular hypertension, Pt. on medications Normal cardiovascular exam Rhythm:Regular Rate:Normal  Echo 10/2019 1. Global longitudinal strain is -21.9%.. Left ventricular ejection fraction, by estimation, is 65 to 70%. The left ventricle has normal function. The left ventricle has no regional wall motion abnormalities. Left ventricular diastolic parameters were  normal.  2. Right ventricular systolic function is normal. The right ventricular size is normal.  3. The mitral valve is normal in structure. Trivial mitral valve  regurgitation.  4. The aortic valve is tricuspid. Aortic valve regurgitation is not visualized. Mild aortic valve sclerosis is present, with no evidence of aortic valve stenosis.  5. The inferior vena cava is normal in size with greater than 50% respiratory variability, suggesting right atrial pressure of 3 mmHg.    Neuro/Psych negative neurological ROS     GI/Hepatic negative GI ROS, Neg liver ROS,   Endo/Other  negative endocrine ROS  Renal/GU negative Renal ROS     Musculoskeletal negative musculoskeletal ROS (+)   Abdominal (+) + obese,   Peds  Hematology negative hematology ROS (+) anemia ,   Anesthesia Other Findings   Reproductive/Obstetrics                           Anesthesia Physical Anesthesia Plan  ASA: II  Anesthesia Plan: General   Post-op Pain Management: GA combined w/ Regional for post-op pain   Induction: Intravenous  PONV Risk Score and Plan: 4 or greater and  Ondansetron, Dexamethasone, Treatment may vary due to age or medical condition, Midazolam and Scopolamine patch - Pre-op  Airway Management Planned: LMA  Additional Equipment: None  Intra-op Plan:   Post-operative Plan: Extubation in OR  Informed Consent: I have reviewed the patients History and Physical, chart, labs and discussed the procedure including the risks, benefits and alternatives for the proposed anesthesia with the patient or authorized representative who has indicated his/her understanding and acceptance.     Dental advisory given  Plan Discussed with: CRNA  Anesthesia Plan Comments:       Anesthesia Quick Evaluation

## 2020-03-06 ENCOUNTER — Ambulatory Visit (HOSPITAL_COMMUNITY): Payer: Medicaid Other | Admitting: Certified Registered"

## 2020-03-06 ENCOUNTER — Encounter (HOSPITAL_COMMUNITY): Payer: Self-pay | Admitting: Surgery

## 2020-03-06 ENCOUNTER — Observation Stay (HOSPITAL_COMMUNITY)
Admission: RE | Admit: 2020-03-06 | Discharge: 2020-03-07 | Disposition: A | Discharge status: Home / Self Care | Payer: Medicaid Other | Source: Ambulatory Visit | Attending: Surgery | Admitting: Surgery

## 2020-03-06 ENCOUNTER — Encounter (HOSPITAL_COMMUNITY)
Admission: RE | Disposition: A | Discharge status: Home / Self Care | Payer: Self-pay | Source: Ambulatory Visit | Attending: Surgery

## 2020-03-06 DIAGNOSIS — Z170 Estrogen receptor positive status [ER+]: Secondary | ICD-10-CM | POA: Diagnosis not present

## 2020-03-06 DIAGNOSIS — C50411 Malignant neoplasm of upper-outer quadrant of right female breast: Secondary | ICD-10-CM | POA: Diagnosis not present

## 2020-03-06 DIAGNOSIS — C50911 Malignant neoplasm of unspecified site of right female breast: Secondary | ICD-10-CM | POA: Diagnosis not present

## 2020-03-06 DIAGNOSIS — G8918 Other acute postprocedural pain: Secondary | ICD-10-CM | POA: Diagnosis not present

## 2020-03-06 DIAGNOSIS — I10 Essential (primary) hypertension: Secondary | ICD-10-CM | POA: Diagnosis not present

## 2020-03-06 HISTORY — PX: MASTECTOMY MODIFIED RADICAL: SHX5962

## 2020-03-06 LAB — PREGNANCY, URINE: Preg Test, Ur: NEGATIVE

## 2020-03-06 SURGERY — MASTECTOMY, MODIFIED RADICAL
Anesthesia: General | Laterality: Right

## 2020-03-06 MED ORDER — LIDOCAINE-EPINEPHRINE (PF) 1 %-1:200000 IJ SOLN
INTRAMUSCULAR | Status: AC
  Filled 2020-03-06: qty 30

## 2020-03-06 MED ORDER — MEPERIDINE HCL 50 MG/ML IJ SOLN: 6.2500 mg | INTRAMUSCULAR | Status: DC | PRN

## 2020-03-06 MED ORDER — ENOXAPARIN SODIUM 40 MG/0.4ML ~~LOC~~ SOLN
40.0000 mg | SUBCUTANEOUS | Status: DC
  Administered 2020-03-07: 40 mg via SUBCUTANEOUS
  Filled 2020-03-06: qty 0.4

## 2020-03-06 MED ORDER — MORPHINE SULFATE (PF) 2 MG/ML IV SOLN: 1.0000 mg | INTRAVENOUS | Status: DC | PRN

## 2020-03-06 MED ORDER — HYDROMORPHONE HCL 1 MG/ML IJ SOLN
0.2500 mg | INTRAMUSCULAR | Status: DC | PRN
  Administered 2020-03-06 (×3): 0.5 mg via INTRAVENOUS

## 2020-03-06 MED ORDER — KCL IN DEXTROSE-NACL 20-5-0.45 MEQ/L-%-% IV SOLN
INTRAVENOUS | Status: DC
  Filled 2020-03-06 (×2): qty 1000

## 2020-03-06 MED ORDER — 0.9 % SODIUM CHLORIDE (POUR BTL) OPTIME
TOPICAL | Status: DC | PRN
  Administered 2020-03-06: 3000 mL

## 2020-03-06 MED ORDER — PROMETHAZINE HCL 25 MG/ML IJ SOLN: 6.2500 mg | INTRAMUSCULAR | Status: DC | PRN

## 2020-03-06 MED ORDER — CHLORHEXIDINE GLUCONATE CLOTH 2 % EX PADS: 6.0000 | MEDICATED_PAD | Freq: Once | CUTANEOUS | Status: DC

## 2020-03-06 MED ORDER — ONDANSETRON HCL 4 MG/2ML IJ SOLN
INTRAMUSCULAR | Status: DC | PRN
  Administered 2020-03-06: 4 mg via INTRAVENOUS

## 2020-03-06 MED ORDER — ROCURONIUM BROMIDE 10 MG/ML (PF) SYRINGE
PREFILLED_SYRINGE | INTRAVENOUS | Status: AC
  Filled 2020-03-06: qty 10

## 2020-03-06 MED ORDER — SCOPOLAMINE 1 MG/3DAYS TD PT72
MEDICATED_PATCH | TRANSDERMAL | Status: DC | PRN
  Administered 2020-03-06: 1 via TRANSDERMAL

## 2020-03-06 MED ORDER — HYDROMORPHONE HCL 1 MG/ML IJ SOLN
INTRAMUSCULAR | Status: AC
  Filled 2020-03-06: qty 1

## 2020-03-06 MED ORDER — ONDANSETRON HCL 4 MG/2ML IJ SOLN: 4.0000 mg | Freq: Four times a day (QID) | INTRAMUSCULAR | Status: DC | PRN

## 2020-03-06 MED ORDER — LIDOCAINE 2% (20 MG/ML) 5 ML SYRINGE
INTRAMUSCULAR | Status: DC | PRN
  Administered 2020-03-06: 100 mg via INTRAVENOUS

## 2020-03-06 MED ORDER — PROPOFOL 10 MG/ML IV BOLUS
INTRAVENOUS | Status: DC | PRN
  Administered 2020-03-06: 200 mg via INTRAVENOUS

## 2020-03-06 MED ORDER — DEXMEDETOMIDINE (PRECEDEX) IN NS 20 MCG/5ML (4 MCG/ML) IV SYRINGE
PREFILLED_SYRINGE | INTRAVENOUS | Status: DC | PRN
  Administered 2020-03-06: 8 ug via INTRAVENOUS
  Administered 2020-03-06: 4 ug via INTRAVENOUS
  Administered 2020-03-06: 8 ug via INTRAVENOUS

## 2020-03-06 MED ORDER — HYDROCODONE-ACETAMINOPHEN 5-325 MG PO TABS
1.0000 | ORAL_TABLET | ORAL | Status: DC | PRN
  Administered 2020-03-07 (×3): 1 via ORAL
  Filled 2020-03-06 (×3): qty 1

## 2020-03-06 MED ORDER — LACTATED RINGERS IV SOLN: INTRAVENOUS | Status: DC

## 2020-03-06 MED ORDER — TRAMADOL HCL 50 MG PO TABS
50.0000 mg | ORAL_TABLET | Freq: Four times a day (QID) | ORAL | Status: DC | PRN
  Administered 2020-03-06 (×2): 50 mg via ORAL
  Filled 2020-03-06 (×2): qty 1

## 2020-03-06 MED ORDER — ONDANSETRON HCL 4 MG/2ML IJ SOLN
INTRAMUSCULAR | Status: AC
  Filled 2020-03-06: qty 2

## 2020-03-06 MED ORDER — OXYCODONE HCL 5 MG PO TABS: 5.0000 mg | ORAL_TABLET | Freq: Once | ORAL | Status: DC | PRN

## 2020-03-06 MED ORDER — ONDANSETRON 4 MG PO TBDP: 4.0000 mg | ORAL_TABLET | Freq: Four times a day (QID) | ORAL | Status: DC | PRN

## 2020-03-06 MED ORDER — DEXAMETHASONE SODIUM PHOSPHATE 10 MG/ML IJ SOLN
INTRAMUSCULAR | Status: DC | PRN
  Administered 2020-03-06: 8 mg via INTRAVENOUS

## 2020-03-06 MED ORDER — CHLORHEXIDINE GLUCONATE 0.12 % MT SOLN
15.0000 mL | Freq: Once | OROMUCOSAL | Status: AC
  Administered 2020-03-06: 15 mL via OROMUCOSAL

## 2020-03-06 MED ORDER — SCOPOLAMINE 1 MG/3DAYS TD PT72
MEDICATED_PATCH | TRANSDERMAL | Status: AC
  Filled 2020-03-06: qty 1

## 2020-03-06 MED ORDER — CEFAZOLIN SODIUM-DEXTROSE 2-4 GM/100ML-% IV SOLN
2.0000 g | INTRAVENOUS | Status: AC
  Administered 2020-03-06: 2 g via INTRAVENOUS
  Filled 2020-03-06: qty 100

## 2020-03-06 MED ORDER — PROPOFOL 10 MG/ML IV BOLUS
INTRAVENOUS | Status: AC
  Filled 2020-03-06: qty 20

## 2020-03-06 MED ORDER — BUPIVACAINE-EPINEPHRINE 0.25% -1:200000 IJ SOLN
INTRAMUSCULAR | Status: AC
  Filled 2020-03-06: qty 1

## 2020-03-06 MED ORDER — FENTANYL CITRATE (PF) 100 MCG/2ML IJ SOLN
INTRAMUSCULAR | Status: DC | PRN
  Administered 2020-03-06: 25 ug via INTRAVENOUS
  Administered 2020-03-06: 50 ug via INTRAVENOUS
  Administered 2020-03-06: 25 ug via INTRAVENOUS
  Administered 2020-03-06 (×3): 50 ug via INTRAVENOUS

## 2020-03-06 MED ORDER — ACETAMINOPHEN 500 MG PO TABS
1000.0000 mg | ORAL_TABLET | ORAL | Status: AC
  Administered 2020-03-06: 1000 mg via ORAL
  Filled 2020-03-06: qty 2

## 2020-03-06 MED ORDER — ORAL CARE MOUTH RINSE: 15.0000 mL | Freq: Once | OROMUCOSAL | Status: AC

## 2020-03-06 MED ORDER — LIDOCAINE 2% (20 MG/ML) 5 ML SYRINGE
INTRAMUSCULAR | Status: AC
  Filled 2020-03-06: qty 5

## 2020-03-06 MED ORDER — OXYCODONE HCL 5 MG/5ML PO SOLN: 5.0000 mg | Freq: Once | ORAL | Status: DC | PRN

## 2020-03-06 MED ORDER — MIDAZOLAM HCL 2 MG/2ML IJ SOLN
INTRAMUSCULAR | Status: AC
  Filled 2020-03-06: qty 2

## 2020-03-06 MED ORDER — PHENYLEPHRINE 40 MCG/ML (10ML) SYRINGE FOR IV PUSH (FOR BLOOD PRESSURE SUPPORT)
PREFILLED_SYRINGE | INTRAVENOUS | Status: DC | PRN
  Administered 2020-03-06: 80 ug via INTRAVENOUS
  Administered 2020-03-06: 40 ug via INTRAVENOUS
  Administered 2020-03-06: 80 ug via INTRAVENOUS

## 2020-03-06 MED ORDER — LISINOPRIL 10 MG PO TABS
10.0000 mg | ORAL_TABLET | Freq: Every day | ORAL | Status: DC
  Administered 2020-03-07: 10 mg via ORAL
  Filled 2020-03-06: qty 1

## 2020-03-06 MED ORDER — DEXAMETHASONE SODIUM PHOSPHATE 10 MG/ML IJ SOLN
INTRAMUSCULAR | Status: AC
  Filled 2020-03-06: qty 1

## 2020-03-06 MED ORDER — FENTANYL CITRATE (PF) 250 MCG/5ML IJ SOLN
INTRAMUSCULAR | Status: AC
  Filled 2020-03-06: qty 5

## 2020-03-06 MED ORDER — MIDAZOLAM HCL 5 MG/5ML IJ SOLN
INTRAMUSCULAR | Status: DC | PRN
  Administered 2020-03-06: 2 mg via INTRAVENOUS

## 2020-03-06 SURGICAL SUPPLY — 45 items
ADH SKN CLS APL DERMABOND .7 (GAUZE/BANDAGES/DRESSINGS) ×2
APL PRP STRL LF DISP 70% ISPRP (MISCELLANEOUS) ×2
APPLIER CLIP 11 MED OPEN (CLIP)
APPLIER CLIP 9.375 MED OPEN (MISCELLANEOUS) ×3
APR CLP MED 11 20 MLT OPN (CLIP)
APR CLP MED 9.3 20 MLT OPN (MISCELLANEOUS) ×1
BINDER BREAST LRG (GAUZE/BANDAGES/DRESSINGS) IMPLANT
BINDER BREAST XLRG (GAUZE/BANDAGES/DRESSINGS) ×3 IMPLANT
BIOPATCH WHT 1IN DISK W/4.0 H (GAUZE/BANDAGES/DRESSINGS) ×6 IMPLANT
BLADE HEX COATED 2.75 (ELECTRODE) ×3 IMPLANT
CHLORAPREP W/TINT 26 (MISCELLANEOUS) ×6 IMPLANT
CLIP APPLIE 11 MED OPEN (CLIP) IMPLANT
CLIP APPLIE 9.375 MED OPEN (MISCELLANEOUS) ×1 IMPLANT
CLOSURE WOUND 1/2 X4 (GAUZE/BANDAGES/DRESSINGS)
COVER SURGICAL LIGHT HANDLE (MISCELLANEOUS) ×3 IMPLANT
COVER WAND RF STERILE (DRAPES) IMPLANT
DERMABOND ADVANCED (GAUZE/BANDAGES/DRESSINGS) ×4
DERMABOND ADVANCED .7 DNX12 (GAUZE/BANDAGES/DRESSINGS) ×2 IMPLANT
DRAIN CHANNEL 19F RND (DRAIN) ×6 IMPLANT
DRAPE LAPAROSCOPIC ABDOMINAL (DRAPES) ×3 IMPLANT
DRAPE UTILITY XL STRL (DRAPES) ×3 IMPLANT
DRSG PAD ABDOMINAL 8X10 ST (GAUZE/BANDAGES/DRESSINGS) ×6 IMPLANT
ELECT REM PT RETURN 15FT ADLT (MISCELLANEOUS) ×3 IMPLANT
EVACUATOR SILICONE 100CC (DRAIN) ×6 IMPLANT
GAUZE SPONGE 4X4 12PLY STRL (GAUZE/BANDAGES/DRESSINGS) ×3 IMPLANT
GLOVE BIOGEL PI IND STRL 7.0 (GLOVE) ×1 IMPLANT
GLOVE BIOGEL PI INDICATOR 7.0 (GLOVE) ×2
GLOVE SURG SYN 7.5  E (GLOVE) ×2
GLOVE SURG SYN 7.5 E (GLOVE) ×1 IMPLANT
GOWN STRL REUS W/ TWL XL LVL3 (GOWN DISPOSABLE) ×2 IMPLANT
GOWN STRL REUS W/TWL XL LVL3 (GOWN DISPOSABLE) ×6
KIT BASIN OR (CUSTOM PROCEDURE TRAY) ×3 IMPLANT
KIT TURNOVER KIT A (KITS) IMPLANT
MARKER SKIN DUAL TIP RULER LAB (MISCELLANEOUS) ×3 IMPLANT
PACK GENERAL/GYN (CUSTOM PROCEDURE TRAY) ×3 IMPLANT
PENCIL SMOKE EVACUATOR (MISCELLANEOUS) IMPLANT
SPONGE DRAIN TRACH 4X4 STRL 2S (GAUZE/BANDAGES/DRESSINGS) IMPLANT
STAPLER VISISTAT 35W (STAPLE) ×3 IMPLANT
STRIP CLOSURE SKIN 1/2X4 (GAUZE/BANDAGES/DRESSINGS) IMPLANT
SUT ETHILON 2 0 PS N (SUTURE) ×6 IMPLANT
SUT MNCRL AB 4-0 PS2 18 (SUTURE) ×6 IMPLANT
SUT SILK 2 0 SH (SUTURE) ×3 IMPLANT
SUT VIC AB 3-0 SH 18 (SUTURE) ×6 IMPLANT
TOWEL OR 17X26 10 PK STRL BLUE (TOWEL DISPOSABLE) ×3 IMPLANT
TOWEL OR NON WOVEN STRL DISP B (DISPOSABLE) ×3 IMPLANT

## 2020-03-06 NOTE — Transfer of Care (Signed)
Immediate Anesthesia Transfer of Care Note  Patient: Alice [name]  Procedure(s) Performed: MASTECTOMY MODIFIED RADICAL (Right )  Patient Location: PACU  Anesthesia Type:General  Level of Consciousness: awake, alert , oriented and patient cooperative  Airway & Oxygen Therapy: Patient Spontanous Breathing and Patient connected to face mask oxygen  Post-op Assessment: Report given to RN, Post -op Vital signs reviewed and stable and Patient moving all extremities  Post vital signs: Reviewed and stable  Last Vitals:  Vitals Value Taken Time  BP    Temp    Pulse    Resp    SpO2      Last Pain:  Vitals:   03/06/20 0544  TempSrc: Oral         Complications: No complications documented.

## 2020-03-06 NOTE — Anesthesia Postprocedure Evaluation (Signed)
Anesthesia Post Note  Patient: Alice [name]  Procedure(s) Performed: MASTECTOMY MODIFIED RADICAL (Right )     Patient location during evaluation: PACU Anesthesia Type: General Level of consciousness: sedated and patient cooperative Pain management: pain level controlled Vital Signs Assessment: post-procedure vital signs reviewed and stable Respiratory status: spontaneous breathing Cardiovascular status: stable Anesthetic complications: no   No complications documented.  Last Vitals:  Vitals:   03/06/20 1256 03/06/20 1350  BP: 127/85 (!) 130/91  Pulse: 79 81  Resp: 18 16  Temp:    SpO2: 99% 100%    Last Pain:  Vitals:   03/06/20 1205  TempSrc: Skin  PainSc:                  Nolon Nations

## 2020-03-06 NOTE — Anesthesia Procedure Notes (Signed)
Anesthesia Regional Block: Pectoralis block   Pre-Anesthetic Checklist: ,, timeout performed, Correct Patient, Correct Site, Correct Laterality, Correct Procedure, Correct Position, site marked, Risks and benefits discussed,  Surgical consent,  Pre-op evaluation,  At surgeon's request and post-op pain management  Laterality: Right  Prep: chloraprep       Needles:   Needle Type: Stimiplex     Needle Length: 9cm      Additional Needles:   Procedures:,,,, ultrasound used (permanent image in chart),,,,  Narrative:  Start time: 03/06/2020 7:15 AM End time: 03/06/2020 7:20 AM Injection made incrementally with aspirations every 5 mL.  Performed by: Personally  Anesthesiologist: Nolon Nations, MD  Additional Notes: Patient tolerated well. Good fascial spread noted.

## 2020-03-06 NOTE — Interval H&P Note (Signed)
History and Physical Interval Note:  03/06/2020 7:20 AM  Alice [name]  has presented today for surgery, with the diagnosis of RIGHT BREAST CANCER.  The various methods of treatment have been discussed with the patient and family.  Agree with above.  Her husband is in the car.  After consideration of risks, benefits and other options for treatment, the patient has consented to  Procedure(s) with comments: [place] (Right) - RNFA as a surgical intervention.  The patient's history has been reviewed, patient examined, no change in status, stable for surgery.  I have reviewed the patient's chart and labs.  Questions were answered to the patient's satisfaction.     Shann Medal

## 2020-03-06 NOTE — Progress Notes (Signed)
She has her daughter, Olin Hauser, and her sister, Danae Chen, in the room with her. I used the translator and went over her post op plans. We will advance her diet and get her to ambulate tonight. She looks good at this time.  Alphonsa Overall, MD, [facility] Surgery Office phone:  [phone]

## 2020-03-06 NOTE — Anesthesia Procedure Notes (Signed)
Procedure Name: LMA Insertion Date/Time: 03/06/2020 7:39 AM Performed by: Mitzie Na, CRNA Pre-anesthesia Checklist: Patient identified, Emergency Drugs available, Suction available and Patient being monitored Patient Re-evaluated:Patient Re-evaluated prior to induction Oxygen Delivery Method: Circle system utilized Preoxygenation: Pre-oxygenation with 100% oxygen Induction Type: IV induction Ventilation: Mask ventilation without difficulty LMA: LMA inserted LMA Size: 4.0 Number of attempts: 1 Placement Confirmation: positive ETCO2 Tube secured with: Tape Dental Injury: Teeth and Oropharynx as per pre-operative assessment

## 2020-03-06 NOTE — Op Note (Signed)
03/06/2020  9:31 AM  PATIENT:  Alice [name], 44 y.o., female, MRN: [id number]  PREOP DIAGNOSIS:  RIGHT BREAST CANCER  POSTOP DIAGNOSIS:   Right breast cancer, 10 o'clock position (T2, N1)  PROCEDURE:   Procedure(s):  Right modified radical MASTECTOMY   SURGEON:   Alphonsa Overall, M.D.  ASSISTANT:   Carlena Hurl, PA  ANESTHESIA:  General  Anesthesiologist: Nolon Nations, MD CRNA: Mitzie Na, CRNA; Key, Kristopher, CRNA  General  ASA:  2  EBL:  100  ml  BLOOD ADMINISTERED: none  DRAINS: Two 19 F Blake drain  LOCAL MEDICATIONS USED:   Right pectoral block  SPECIMEN:   Right mastectomy (suture lateral), Superior skin (long suture lateral, short suture inferior)  COUNTS CORRECT:  YES  INDICATIONS FOR PROCEDURE:  Alice [name] is a 44 y.o. (DOB: [birth year]) Hispanic female whose primary care physician is Drue Flirt, MD and comes for right modified radical mastectomy.     [facility] with Drs. Magrinat and [place].  She had a large tumor in the OUQ of the right breast with positive right axillary lymph nodes that is ER - Neg and Her2Neu positive.  She has completed neoadjuvant chemotherapy and now comes for right mastectomy with right axillary node dissection.  The indications and risks of the surgery were explained to the patient.  The risks include, but are not limited to, infection, bleeding, and nerve injury.  She had a right pectoral block prior to the surgery.  OPERATIVE NOTE;  The patient was taken to room # 1 at [facility] where she underwent a general anesthesia  supervised by Anesthesiologist: Nolon Nations, MD CRNA: Mitzie Na, CRNA; Key, Kristopher, CRNA. Her right breast and axilla were prepped with ChloraPrep and sterilely draped.    A time-out and the surgical check list was reviewed.    She has a large breast.  I made an elliptical incision including the areola in the right breast.  I developed skin flaps medially to the  lateral edge of the sternum, inferiorly to the investing fascia of the rectus abdominus muscle, laterally to the anterior edge of the latissimus dorsi muscle, and superiorly to about 2 finger breaths below the clavicle.  The breast was reflected off the pectoralis muscle from medial to lateral.  The lateral attachments in the right axilla were divided and the breast removed.  A long suture was placed on the lateral aspect of the breast.   I dissected out the right axilla.  I identified the right axillary vein and swept the axillary contents inferiorly.  I dissected fat/nodes behind the pectoralis minor muscle for a Level II dissection.  The axillary contents were swept out with the breast specimen.   I resected an additional margin of the superior mastectomy skin flap.  I labeled this long suture lateral and short suture inferior.   I brought out 2 81 F Blake drains below the inferior flaps.  These were sewn in place with 2-0 Nylons.  I irrigated the wound with 4,000 cc of fluid.   The skin was closed with interrupted 3-0 Vicryl sutures and the skin was closed with a 4-0 Monocryl.  The wound was painted with Dermabond.    A pressure dressing was placed on the wound and the chest wrapped with a breast binder.  Her needle and sponge count were correct at the end of the case.   She was transferred to the recovery room in good condition.  Shanon Brow  Lucia Gaskins, MD, [facility] Surgery Pager: [phone] Office phone:  [phone]

## 2020-03-07 ENCOUNTER — Encounter (HOSPITAL_COMMUNITY): Payer: Self-pay | Admitting: Surgery

## 2020-03-07 DIAGNOSIS — C50911 Malignant neoplasm of unspecified site of right female breast: Secondary | ICD-10-CM | POA: Diagnosis not present

## 2020-03-07 DIAGNOSIS — I10 Essential (primary) hypertension: Secondary | ICD-10-CM | POA: Diagnosis not present

## 2020-03-07 MED ORDER — HYDROCODONE-ACETAMINOPHEN 5-325 MG PO TABS: 1.0000 | ORAL_TABLET | Freq: Four times a day (QID) | ORAL | 0 refills | Status: DC | PRN

## 2020-03-07 NOTE — Discharge Instructions (Signed)
[facility] - DISCHARGE INSTRUCTIONS TO PATIENT  Activity:  Lifting - No lifting more than 15 pounds for 10 days.                       Practice your Covid-19 protection:  Wear a mask, social distance, and wash your hands frequently  Wound Care:   Leave the incision dry for 2 days, then you may shower.        Empty the drains at least twice a day and record the amount of drainage.  Diet:  As tolerated  Follow up appointment:  You have an appointment for 03/13/2020 (Monday) at 2:30 PM.  Call Dr. Pollie Friar office [facility] Surgery) at [phone] for any question.      The address is:  [address], [place] [place]  Medications and dosages:  Resume your home medications.  You have a prescription for:  Vicodin             You may also take Tylenol, ibuprofen, or Aleve for pain  Call Dr. Lucia Gaskins or his office  [phone]) if you have:  Temperature greater than 100.4,  Redness, tenderness, or signs of infection (pain, swelling, redness, odor or green/yellow discharge around the site),  Any other questions or concerns you may have after discharge.  In an emergency, call 911 or go to an Emergency Department at a nearby hospital.

## 2020-03-07 NOTE — Plan of Care (Signed)
  Problem: Education: Goal: Knowledge of General Education information will improve Description: Including pain rating scale, medication(s)/side effects and non-pharmacologic comfort measures Outcome: Progressing   Problem: Pain Managment: Goal: General experience of comfort will improve Outcome: Progressing   

## 2020-03-07 NOTE — Discharge Summary (Signed)
Physician Discharge Summary  Patient ID:  Alice [name]  MRN: [id number]  DOB/AGE: 1975/10/29 44 y.o.  Admit date: 03/06/2020 Discharge date: 03/07/2020  Discharge Diagnoses:   Active Problems:   Breast cancer, stage 2, right ([place])   Operation: Procedure(s): MASTECTOMY MODIFIED RADICAL on 03/06/2020 - [place]  Discharged Condition: good  Hospital Course: Alice [name] is an 44 y.o. female whose primary care physician is Drue Flirt, MD and who was admitted 03/06/2020 with a chief complaint of No chief complaint on file. Marland Kitchen   She was brought to the operating room on 03/06/2020 and underwent right modified radical mastecotmy.  She is one day post op and doing well. I have used the Spanish interpreter several times to review her discharge.  Her husband is in the room with her. The discharge instructions were reviewed with the patient.  Consults: None  Significant Diagnostic Studies: Results for orders placed or performed during the hospital encounter of 03/06/20  Pregnancy, urine  Result Value Ref Range   Preg Test, Ur NEGATIVE NEGATIVE    MR BREAST BILATERAL W WO CONTRAST INC CAD  Result Date: 02/15/2020 CLINICAL DATA:  44 year old female for follow-up evaluation of known RIGHT breast cancer with metastasis to RIGHT axillary lymph node, undergoing neoadjuvant therapy. LABS:  None performed today EXAM: BILATERAL BREAST MRI WITH AND WITHOUT CONTRAST TECHNIQUE: Multiplanar, multisequence MR images of both breasts were obtained prior to and following the intravenous administration of 10 ml of Gadavist Three-dimensional MR images were rendered by post-processing of the original MR data on an independent workstation. The three-dimensional MR images were interpreted, and findings are reported in the following complete MRI report for this study. Three dimensional images were evaluated at the independent interpreting workstation using the DynaCAD thin client. COMPARISON:  11/12/2019 MRI.  FINDINGS: Breast composition: d. Extreme fibroglandular tissue. Background parenchymal enhancement: Minimal Right breast: The known malignancy within the UPPER-OUTER RIGHT breast, middle depth, is less confluent and only exhibits mild persistent enhancement, with best defined measurements of 3 x 1.5 x 1.5 cm, previously 4 x 3.6 x 1.9 cm (series 12: Image 43). Biopsy clip artifact along the LATERAL aspect of this known malignancy is again noted. A 1.1 x 1 x 0.8 cm irregular enhancing mass within the anterior LOWER central RIGHT breast appears unchanged (12:80). Now identified is a 0.7 x 0.3 x 0.5 cm irregular enhancing mass within the anterior central/RETROAREOLAR RIGHT breast (12:65). No other suspicious abnormalities within the RIGHT breast are identified. Small cysts within the RIGHT breast are present. Left breast: No mass or abnormal enhancement. Small cysts within the LEFT breast are noted. Lymph nodes: No abnormal appearing lymph nodes. It is difficult to visualize biopsy clip artifact in the RIGHT axilla. Ancillary findings: Port-A-Cath within the high MEDIAL LEFT chest noted. IMPRESSION: 1. Treatment response of UPPER-OUTER RIGHT breast malignancy exhibiting improved enhancement characteristics and now measuring 3 x 1.5 x 1.5 cm, previously 4 x 3.6 x 1.9 cm 2. Unchanged indeterminate 1.1 cm mass within the anterior LOWER central RIGHT breast and new 0.7 cm mass within the anterior central/RETROAREOLAR RIGHT breast. It would be difficult to definitively identify these 2 masses sonographically and therefore MR guided biopsies of both of these areas recommended if breast conservation is desired. 3. No abnormal appearing lymph nodes. 4. No MR evidence of LEFT breast malignancy. RECOMMENDATION: 1. MR guided biopsies of the 1.1 cm anterior LOWER central RIGHT breast mass and 0.7 cm anterior central/RETROAREOLAR RIGHT breast mass if breast conservation is desired. 2.  Otherwise treatment plan. BI-RADS CATEGORY  4:  Suspicious. Electronically Signed   By: Margarette Canada M.D.   On: 02/15/2020 11:57    Discharge Exam:  Vitals:   03/07/20 0220 03/07/20 0626  BP: 130/89 125/84  Pulse: 90 85  Resp: 18 17  Temp: 97.7 F (36.5 C) (!) 97.5 F (36.4 C)  SpO2: 97% 98%    General: WN Hispanic F who is alert and generally healthy appearing.  Chest:  Wound looks good.  Drains recorded 1/2 - 25 and 37.5 cc over the last 24 hours.  Discharge Medications:   Allergies as of 03/07/2020   No Known Allergies     Medication List    TAKE these medications   doxycycline 100 MG tablet Commonly known as: VIBRA-TABS Take 1 tablet (100 mg total) by mouth daily.   HYDROcodone-acetaminophen 5-325 MG tablet Commonly known as: NORCO/VICODIN Take 1 tablet by mouth every 6 (six) hours as needed for moderate pain.   ibuprofen 200 MG tablet Commonly known as: ADVIL Take 400 mg by mouth every 6 (six) hours as needed for fever, headache or cramping.   lisinopril 10 MG tablet Commonly known as: ZESTRIL Take 10 mg by mouth daily.   loratadine 10 MG tablet Commonly known as: Claritin Take 1 tablet (10 mg total) by mouth daily.   predniSONE 5 MG tablet Commonly known as: DELTASONE 6 tab x 1 day, 5 tab x 1 day, 4 tab x 1 day, 3 tab x 1 day, 2 tab x 1 day, 1 tab x 1 day, stop   prochlorperazine 10 MG tablet Commonly known as: COMPAZINE Take 10 mg by mouth every 6 (six) hours as needed for nausea or vomiting.       Disposition: Discharge disposition: 01-Home or Self Care       Discharge Instructions    Diet - low sodium heart healthy   Complete by: As directed    Increase activity slowly   Complete by: As directed    Increase activity slowly   Complete by: As directed          Signed: Alphonsa Overall, M.D., [facility] Surgery Office:  [phone]  03/07/2020, 9:24 AM

## 2020-03-08 LAB — SURGICAL PATHOLOGY

## 2020-03-10 ENCOUNTER — Encounter: Payer: Self-pay | Admitting: *Deleted

## 2020-03-10 DIAGNOSIS — Z419 Encounter for procedure for purposes other than remedying health state, unspecified: Secondary | ICD-10-CM | POA: Diagnosis not present

## 2020-03-10 DIAGNOSIS — C50411 Malignant neoplasm of upper-outer quadrant of right female breast: Secondary | ICD-10-CM

## 2020-03-12 NOTE — Progress Notes (Signed)
[place]   [phone] [phone]) [phone]   Clinic Follow up Note   Patient Care Team: Drue Flirt, MD as PCP - General (Family Medicine) Mauro Kaufmann, RN as Oncology Nurse Navigator Rockwell Germany, RN as Oncology Nurse Navigator Alphonsa Overall, MD as Consulting Physician (General Surgery) Magrinat, Virgie Dad, MD as Consulting Physician (Oncology) Kyung Rudd, MD as Consulting Physician (Radiation Oncology) 03/13/2020  CHIEF COMPLAINT: F/u ER/PR - HER2 + right breast cancer   CURRENT THERAPY: Neoadjuvant chemo TCHP completed 02/01/20 followed by right mastectomy 03/06/20; maintenance herceptin/perjeta starting 02/22/20   INTERVAL HISTORY: [name] returns for f/u as scheduled.  A remote Spanish interpreter was used for today's visit.  She completed first cycle of maintenance Herceptin/Perjeta alone on 02/22/2020 and underwent right mastectomy on 9/27 by Dr. Lucia Gaskins. Path showed no residual carcinoma in the breast or axilla, 0/7 positive LNs, margins clear; ypT0N0.  She is recovering "little by little."  Bowels moving despite mild constipation.  She is mostly resting since surgery but still up at home.  She has significant pain.  Two JP's continue to drain, but decreasing daily, 30 cc yesterday.  Denies fever, chills, cough, chest pain, dyspnea, swelling, nausea/vomiting.  She is tearful throughout today's visit, she has not heard back on her disability application and wants to move to a different residence.  She also notes mounting hospital bills.   MEDICAL HISTORY:  Past Medical History:  Diagnosis Date   Anemia    Breast cancer ([place])    Right   Hypertension     SURGICAL HISTORY: Past Surgical History:  Procedure Laterality Date   BREAST BIOPSY     CHOLECYSTECTOMY     IR IMAGING GUIDED PORT INSERTION  10/13/2019   MASTECTOMY MODIFIED RADICAL Right 03/06/2020   Procedure: MASTECTOMY MODIFIED RADICAL;  Surgeon: Alphonsa Overall, MD;   Location: [facility] ORS;  Service: General;  Laterality: Right;  RNFA    I have reviewed the social history and family history with the patient and they are unchanged from previous note.  ALLERGIES:  has No Known Allergies.  MEDICATIONS:  Current Outpatient Medications  Medication Sig Dispense Refill   HYDROcodone-acetaminophen (NORCO/VICODIN) 5-325 MG tablet Take 1 tablet by mouth every 6 (six) hours as needed for moderate pain. 15 tablet 0   lisinopril (ZESTRIL) 10 MG tablet Take 10 mg by mouth daily.     prochlorperazine (COMPAZINE) 10 MG tablet Take 10 mg by mouth every 6 (six) hours as needed for nausea or vomiting.     doxycycline (VIBRA-TABS) 100 MG tablet Take 1 tablet (100 mg total) by mouth daily. (Patient not taking: Reported on 02/23/2020) 60 tablet 1   ibuprofen (ADVIL,MOTRIN) 200 MG tablet Take 400 mg by mouth every 6 (six) hours as needed for fever, headache or cramping.  (Patient not taking: Reported on 02/23/2020)     loratadine (CLARITIN) 10 MG tablet Take 1 tablet (10 mg total) by mouth daily. (Patient not taking: Reported on 02/23/2020) 60 tablet 1   predniSONE (DELTASONE) 5 MG tablet 6 tab x 1 day, 5 tab x 1 day, 4 tab x 1 day, 3 tab x 1 day, 2 tab x 1 day, 1 tab x 1 day, stop (Patient not taking: Reported on 02/23/2020) 21 tablet 0   No current facility-administered medications for this visit.   Facility-Administered Medications Ordered in Other Visits  Medication Dose Route Frequency Provider Last Rate Last Admin   0.9 %  sodium chloride infusion  Intravenous Once Nicholas Lose, MD       acetaminophen (TYLENOL) tablet 650 mg  650 mg Oral Once Nicholas Lose, MD       diphenhydrAMINE (BENADRYL) capsule 50 mg  50 mg Oral Once Nicholas Lose, MD       heparin lock flush 100 unit/mL  500 Units Intracatheter Once PRN Nicholas Lose, MD       pertuzumab (PERJETA) 420 mg in sodium chloride 0.9 % 250 mL chemo infusion  420 mg Intravenous Once Nicholas Lose, MD       sodium  chloride flush (NS) 0.9 % injection 10 mL  10 mL Intracatheter PRN Nicholas Lose, MD       trastuzumab-dkst (OGIVRI) 525 mg in sodium chloride 0.9 % 250 mL chemo infusion  6 mg/kg (Treatment Plan Recorded) Intravenous Once Nicholas Lose, MD        PHYSICAL EXAMINATION: ECOG PERFORMANCE STATUS: 1-2  Vitals:   03/13/20 1155  BP: 117/82  Resp: 18  Temp: (!) 97.4 F (36.3 C)  SpO2: 100%   Filed Weights   03/13/20 1155  Weight: 196 lb 11.2 oz (89.2 kg)    GENERAL:alert, no distress and comfortable SKIN: No rash to exposed skin EYES: sclera clear LUNGS: clear with normal breathing effort HEART: Mild tachycardia, regular rhythm, no lower extremity edema NEURO: alert & oriented x 3 with fluent speech PAC without erythema Breast exam: S/p right mastectomy, site covered with extensive gauze dressings.  The medial edge of the mastectomy incision is visible which appears closed without erythema or drainage.  The complete surgical site is not visible on this limited exam  LABORATORY DATA:  I have reviewed the data as listed CBC Latest Ref Rng & Units 03/13/2020 03/02/2020 02/22/2020  WBC 4.0 - 10.5 K/uL 6.9 6.9 4.3  Hemoglobin 12.0 - 15.0 g/dL 9.5(L) 9.3(L) 8.1(L)  Hematocrit 36 - 46 % 30.0(L) 28.9(L) 25.2(L)  Platelets 150 - 400 K/uL 300 330 101(L)     CMP Latest Ref Rng & Units 03/13/2020 02/22/2020 02/08/2020  Glucose 70 - 99 mg/dL 112(H) 99 106(H)  BUN 6 - 20 mg/dL $Remove'14 9 10  'dSucAHg$ Creatinine 0.44 - 1.00 mg/dL 0.80 0.70 0.70  Sodium 135 - 145 mmol/L 135 138 136  Potassium 3.5 - 5.1 mmol/L 3.5 3.4(L) 3.9  Chloride 98 - 111 mmol/L 100 102 102  CO2 22 - 32 mmol/L $RemoveB'28 28 26  'pKBvUFbm$ Calcium 8.9 - 10.3 mg/dL 8.9 7.4(L) 9.2  Total Protein 6.5 - 8.1 g/dL 7.0 6.6 6.7  Total Bilirubin 0.3 - 1.2 mg/dL 1.1 0.5 0.9  Alkaline Phos 38 - 126 U/L 123 110 149(H)  AST 15 - 41 U/L 41 17 29  ALT 0 - 44 U/L 69(H) 20 56(H)      RADIOGRAPHIC STUDIES: I have personally reviewed the radiological images as listed  and agreed with the findings in the report. No results found.   ASSESSMENT & PLAN: 44 y.o. female   1. Malignant neoplasm of upper outer quadrant of right breast, ER/PR negative, HER2 positive, cT3 N2, stage IIIA 1. Diagnosed on breast and right axillary lymph node biopsy 09/30/2019  invasive ductal carcinoma, grade 3, ER/PR - HER2 +  MIB-1 of 80%. -CT CAP and bone scan on 10/15/2019 negative for distant metastasis -genetics testing 10/13/2019 through the Common Hereditary Cancers Panel offered by [facility] found no deleterious mutations  -S/p neoadjuvant chemotherapy with TCHP q. 21 days x 6 from 10/19/2019-02/01/2020 -Began maintenance Herceptin/Perjeta q. 21 days on 02/22/2020 for an additional  9 months to complete 1 year of therapy -Repeat MRI 02/15/2020 showed treatment response in the upper outer right breast malignancy with improved enhancement and an unchanged indeterminate 1.1 cm mass in the anterior lower central right breast with a new 0.7 cm mass in the retroareolar right breast.  There were no abnormal appearing lymph nodes and no evidence of malignancy in the left breast -S/p right mastectomy per Dr. Lucia Gaskins on 03/06/2020, path showed no residual carcinoma, 0/7+ lymph nodes, clear margins; staged YPT0N0.  This is consistent with a complete pathologic response -Adjuvant RT consult on 10/27 with Dr. Lisbeth Renshaw  Disposition: [name] is clinically stable.  She continues to recover from right mastectomy on 03/06/2020 by Dr. Lucia Gaskins.  She still has 2 JP drains with decreasing output. I reviewed her final pathology report which shows no residual carcinoma in the breast or 0/7 lymph nodes, consistent with a complete pathologic response to neoadjuvant treatment.  I reviewed this is usually associated with a good prognosis.  She will see her surgeon later this afternoon.   We reviewed the CBC and CMP from today.  Labs adequate to proceed with another cycle of maintenance Herceptin/Perjeta today.  Dr. Jana Hakim  is considering stopping Perjeta after today's cycle.  She will return for follow-up and next cycle in 3 weeks.  She is scheduled for radiation consult on 10/27.  Pathology and plan were discussed with Dr. Jana Hakim.  She is feeling the effects of mounting stress from medical bills, pending disability application, and the desire to move residences.  She is out of work due to her medical condition and treatment.  I am referring her to social work and financial advocate to help with resources.  No problem-specific Assessment & Plan notes found for this encounter.   Orders Placed This Encounter  Procedures   Ambulatory referral to Social Work    Referral Priority:   Routine    Referral Type:   Consultation    Referral Reason:   Specialty Services Required    Number of Visits Requested:   1   All questions were answered. The patient knows to call the clinic with any problems, questions or concerns. No barriers to learning were detected. Total encounter time was 30 minutes.      Alla Feeling, NP 03/13/20

## 2020-03-13 ENCOUNTER — Encounter: Payer: Self-pay | Admitting: *Deleted

## 2020-03-13 ENCOUNTER — Inpatient Hospital Stay: Payer: Medicaid Other

## 2020-03-13 ENCOUNTER — Encounter: Payer: Self-pay | Admitting: Licensed Clinical Social Worker

## 2020-03-13 ENCOUNTER — Inpatient Hospital Stay: Payer: Medicaid Other | Attending: Oncology

## 2020-03-13 ENCOUNTER — Inpatient Hospital Stay (HOSPITAL_BASED_OUTPATIENT_CLINIC_OR_DEPARTMENT_OTHER): Payer: Medicaid Other | Admitting: Nurse Practitioner

## 2020-03-13 ENCOUNTER — Encounter: Payer: Self-pay | Admitting: Nurse Practitioner

## 2020-03-13 ENCOUNTER — Other Ambulatory Visit: Payer: Self-pay

## 2020-03-13 VITALS — HR 97

## 2020-03-13 VITALS — BP 117/82 | Temp 97.4°F | Resp 18 | Ht 62.0 in | Wt 196.7 lb

## 2020-03-13 DIAGNOSIS — Z171 Estrogen receptor negative status [ER-]: Secondary | ICD-10-CM | POA: Diagnosis not present

## 2020-03-13 DIAGNOSIS — C773 Secondary and unspecified malignant neoplasm of axilla and upper limb lymph nodes: Secondary | ICD-10-CM | POA: Insufficient documentation

## 2020-03-13 DIAGNOSIS — Z170 Estrogen receptor positive status [ER+]: Secondary | ICD-10-CM

## 2020-03-13 DIAGNOSIS — C50411 Malignant neoplasm of upper-outer quadrant of right female breast: Secondary | ICD-10-CM | POA: Insufficient documentation

## 2020-03-13 DIAGNOSIS — Z5112 Encounter for antineoplastic immunotherapy: Secondary | ICD-10-CM | POA: Diagnosis not present

## 2020-03-13 DIAGNOSIS — Z79899 Other long term (current) drug therapy: Secondary | ICD-10-CM | POA: Diagnosis not present

## 2020-03-13 DIAGNOSIS — Z95828 Presence of other vascular implants and grafts: Secondary | ICD-10-CM

## 2020-03-13 LAB — COMPREHENSIVE METABOLIC PANEL
ALT: 69 U/L — ABNORMAL HIGH (ref 0–44)
AST: 41 U/L (ref 15–41)
Albumin: 3.4 g/dL — ABNORMAL LOW (ref 3.5–5.0)
Alkaline Phosphatase: 123 U/L (ref 38–126)
Anion gap: 7 (ref 5–15)
BUN: 14 mg/dL (ref 6–20)
CO2: 28 mmol/L (ref 22–32)
Calcium: 8.9 mg/dL (ref 8.9–10.3)
Chloride: 100 mmol/L (ref 98–111)
Creatinine, Ser: 0.8 mg/dL (ref 0.44–1.00)
GFR calc Af Amer: >60 mL/min (ref >60)
GFR calc non Af Amer: >60 mL/min (ref >60)
Glucose, Bld: 112 mg/dL — ABNORMAL HIGH (ref 70–99)
Potassium: 3.5 mmol/L (ref 3.5–5.1)
Sodium: 135 mmol/L (ref 135–145)
Total Bilirubin: 1.1 mg/dL (ref 0.3–1.2)
Total Protein: 7 g/dL (ref 6.5–8.1)

## 2020-03-13 LAB — CBC WITH DIFFERENTIAL/PLATELET
Abs Immature Granulocytes: 0.02 10*3/uL (ref 0.00–0.07)
Basophils Absolute: 0 10*3/uL (ref 0.0–0.1)
Basophils Relative: 1 %
Eosinophils Absolute: 0.1 10*3/uL (ref 0.0–0.5)
Eosinophils Relative: 2 %
HCT: 30 % — ABNORMAL LOW (ref 36.0–46.0)
Hemoglobin: 9.5 g/dL — ABNORMAL LOW (ref 12.0–15.0)
Immature Granulocytes: 0 %
Lymphocytes Relative: 31 %
Lymphs Abs: 2.1 10*3/uL (ref 0.7–4.0)
MCH: 32.3 pg (ref 26.0–34.0)
MCHC: 31.7 g/dL (ref 30.0–36.0)
MCV: 102 fL — ABNORMAL HIGH (ref 80.0–100.0)
Monocytes Absolute: 0.7 10*3/uL (ref 0.1–1.0)
Monocytes Relative: 10 %
Neutro Abs: 3.9 10*3/uL (ref 1.7–7.7)
Neutrophils Relative %: 56 %
Platelets: 300 10*3/uL (ref 150–400)
RBC: 2.94 MIL/uL — ABNORMAL LOW (ref 3.87–5.11)
RDW: 17.3 % — ABNORMAL HIGH (ref 11.5–15.5)
WBC: 6.9 10*3/uL (ref 4.0–10.5)
nRBC: 0 % (ref 0.0–0.2)

## 2020-03-13 LAB — PREGNANCY, URINE: Preg Test, Ur: NEGATIVE

## 2020-03-13 MED ORDER — SODIUM CHLORIDE 0.9% FLUSH
10.0000 mL | INTRAVENOUS | Status: DC | PRN
  Administered 2020-03-13: 10 mL
  Filled 2020-03-13: qty 10

## 2020-03-13 MED ORDER — ACETAMINOPHEN 325 MG PO TABS
650.0000 mg | ORAL_TABLET | Freq: Once | ORAL | Status: AC
  Administered 2020-03-13: 650 mg via ORAL

## 2020-03-13 MED ORDER — TRASTUZUMAB-DKST CHEMO 150 MG IV SOLR
6.0000 mg/kg | Freq: Once | INTRAVENOUS | Status: AC
  Administered 2020-03-13: 525 mg via INTRAVENOUS
  Filled 2020-03-13: qty 25

## 2020-03-13 MED ORDER — DIPHENHYDRAMINE HCL 25 MG PO CAPS
50.0000 mg | ORAL_CAPSULE | Freq: Once | ORAL | Status: AC
  Administered 2020-03-13: 50 mg via ORAL

## 2020-03-13 MED ORDER — ACETAMINOPHEN 325 MG PO TABS
ORAL_TABLET | ORAL | Status: AC
  Filled 2020-03-13: qty 2

## 2020-03-13 MED ORDER — SODIUM CHLORIDE 0.9 % IV SOLN
Freq: Once | INTRAVENOUS | Status: AC
  Filled 2020-03-13: qty 250

## 2020-03-13 MED ORDER — SODIUM CHLORIDE 0.9 % IV SOLN
420.0000 mg | Freq: Once | INTRAVENOUS | Status: AC
  Administered 2020-03-13: 420 mg via INTRAVENOUS
  Filled 2020-03-13: qty 14

## 2020-03-13 MED ORDER — HEPARIN SOD (PORK) LOCK FLUSH 100 UNIT/ML IV SOLN
500.0000 [IU] | Freq: Once | INTRAVENOUS | Status: AC | PRN
  Administered 2020-03-13: 500 [IU]
  Filled 2020-03-13: qty 5

## 2020-03-13 MED ORDER — DIPHENHYDRAMINE HCL 25 MG PO CAPS
ORAL_CAPSULE | ORAL | Status: AC
  Filled 2020-03-13: qty 2

## 2020-03-13 NOTE — Patient Instructions (Signed)

## 2020-03-13 NOTE — Patient Instructions (Signed)
[facility] Discharge Instructions for Patients Receiving Chemotherapy  Today you received the following chemotherapy agents Trastuzumab and Perjeta  To help prevent nausea and vomiting after your treatment, we encourage you to take your nausea medication as directed.    If you develop nausea and vomiting that is not controlled by your nausea medication, call the clinic.   BELOW ARE SYMPTOMS THAT SHOULD BE REPORTED IMMEDIATELY:  *FEVER GREATER THAN 100.5 F  *CHILLS WITH OR WITHOUT FEVER  NAUSEA AND VOMITING THAT IS NOT CONTROLLED WITH YOUR NAUSEA MEDICATION  *UNUSUAL SHORTNESS OF BREATH  *UNUSUAL BRUISING OR BLEEDING  TENDERNESS IN MOUTH AND THROAT WITH OR WITHOUT PRESENCE OF ULCERS  *URINARY PROBLEMS  *BOWEL PROBLEMS  UNUSUAL RASH Items with * indicate a potential emergency and should be followed up as soon as possible.  Feel free to call the clinic should you have any questions or concerns. The clinic phone number is [phone].  Please show the CHEMO ALERT CARD at check-in to the Emergency Department and triage nurse.   

## 2020-03-13 NOTE — Progress Notes (Signed)
[facility]  Clinical Social Work was referred by L. Kalman Shan, NP for assessment of psychosocial needs related to disability application, finances.  Clinical Social Worker met with patient  to offer support and assess for needs.    Patient lives in a rented home with her husband and two children. She was working doing cleaning prior to diagnosis, but has been unable to work through treatment. Her brother is helping them financially. She has relied on God & her faith to help her cope through treatment.  Current services: [employer]- approved in May 2021. CSW sent request to S. Nyra Capes to determine if any funds left Social Security disability- patient applied on her own May 2021. Application pending. Spoke with DDS in [place] today, confirmed information. Application being reviewed by examiner who will contact patient about next steps.  Patient completed application for [employer] today. Not eligible for [place] in [place] based on permanent resident status. Signed up for and received first installment of [employer].      Demia Viera, [place], [place] Worker [facility]

## 2020-03-16 ENCOUNTER — Encounter: Payer: Self-pay | Admitting: Oncology

## 2020-03-16 NOTE — Progress Notes (Signed)
Received referral from [facility] regarding patient concern w/ household bills. Patient has grant funds remaining. Alice [name] interpreter to reach out to patient to advise what is needed from her and gave her my card to know who the information needs to go to.

## 2020-04-02 NOTE — Progress Notes (Signed)
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  She is accompanied by a translator  Since her last visit here she underwent a right modified radical mastectomy under Dr. Alphonsa Overall. The final pathology ([id number]) showed no residual carcinoma in the breast and no evidence of metastatic carcinoma and 7 axillary lymph nodes.  She continues on maintenance trastuzumab and pertuzumab, with a dose due today. Her most recent echocardiogram was 10/15/2019, with an ejection fraction in the 65-70%  She has an appointment with radiation oncology 04/05/2020.    REVIEW OF SYSTEMS: Liat still has pain from the surgery.  It is not clear that she was able to fill the Norco.  She is currently on no pain medication.  She has had no bleeding or fever.  She would be interested in receiving some bras and prostheses if she could obtain them.  She does complain of numbness over the surgical site.  Overall her review of systems today is remarkably benign.   HISTORY OF CURRENT ILLNESS: From the original intake note:  Jalen Oberry presented with right breast pain with swelling and skin changes (duskiness and dimpling) and left breast pain at 6 o'clock.  She underwent bilateral diagnostic mammography with tomography and bilateral breast ultrasonography at [facility] on 09/23/2019 showing: breast density category C; 5.2 cm irregular mass in right breast spanning 10-12 o'clock, with marked peau d'orange changes concerning for inflammatory component; 3.3 cm enlarged lymph node in right axilla; indeterminate 1 cm oval mass in left breast at 5 o'clock.  Accordingly on 09/30/2019 she proceeded to biopsy of the right breast area in question. The pathology from this procedure ([id number]) showed: invasive mammary carcinoma, grade 3, e-cadherin positive. Prognostic indicators significant for: estrogen receptor, 0% negative and progesterone receptor, 0% negative. Proliferation marker Ki67 at 80%. HER2 positive by immunohistochemistry (3+).  The biopsied right axillary lymph node was positive for metastatic carcinoma.  She also underwent cyst aspiration of the 1 cm mass in the left breast the same day.  The patient's subsequent history is as detailed below.   PAST MEDICAL HISTORY: Past Medical History:  Diagnosis Date  . Anemia   . Breast cancer ([place])    Right  . Hypertension     PAST SURGICAL HISTORY: Past Surgical History:  Procedure Laterality Date  . BREAST BIOPSY    . CHOLECYSTECTOMY    . IR IMAGING GUIDED PORT INSERTION  10/13/2019  . MASTECTOMY MODIFIED RADICAL Right 03/06/2020   Procedure: MASTECTOMY MODIFIED RADICAL;  Surgeon: Alphonsa Overall, MD;  Location: [facility];  Service: General;  Laterality: Right;  RNFA    FAMILY HISTORY: No family history on file.  As of 09/2019-- her father is 17 and her mother is 35. She has two brothers and one sister. There is no cancer in her family to her knowledge.    GYNECOLOGIC  HISTORY:  No LMP recorded. Menarche: 44 years old Age at first live birth: 44 years old GX P 2 LMP regular periods lasting 3 days; periods stopped after the first chemotherapy dose May 2021 Contraceptive: previously used, has not  used for the last 2 years HRT n/a  Hysterectomy? no BSO? no   SOCIAL HISTORY: (updated 09/2019)  Danetta [place]  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 Husband [name] is currently unemployed but is a good Training and development officer. She lives at home with [name] and their two children. Son [name], age 79, has special needs.  Son [name], age 10, is in high school. She attends    ADVANCED DIRECTIVES: In the absence of any documentation to the contrary, the patient's spouse is their HCPOA.    HEALTH MAINTENANCE: Social History   Tobacco Use  . Smoking status: Never Smoker  . Smokeless tobacco: Never Used  Vaping Use  . Vaping Use: Never used  Substance Use Topics  . Alcohol use: Never  . Drug use: Never     Colonoscopy: n/a (age)  PAP: 2020  Bone density: n/a (age)   No Known Allergies  Current Outpatient Medications  Medication Sig Dispense Refill  . doxycycline (VIBRA-TABS) 100 MG tablet Take 1 tablet (100 mg total) by mouth daily. (Patient not taking: Reported on 02/23/2020) 60 tablet 1  . HYDROcodone-acetaminophen (NORCO/VICODIN) 5-325 MG tablet Take 1 tablet by mouth every 6 (six) hours as needed for moderate pain. 15 tablet 0  . ibuprofen (ADVIL,MOTRIN) 200 MG tablet Take 400 mg by mouth every 6 (six) hours as needed for fever, headache or cramping.  (Patient not taking: Reported on 02/23/2020)    . lisinopril (ZESTRIL) 10 MG tablet Take 10 mg by mouth daily.    Marland Kitchen loratadine (CLARITIN) 10 MG tablet Take 1 tablet (10 mg total) by mouth daily. (Patient not taking: Reported on 02/23/2020) 60 tablet 1  . predniSONE (DELTASONE) 5 MG tablet 6 tab x 1 day, 5 tab x 1 day, 4 tab x 1 day, 3 tab x 1 day, 2 tab x 1 day, 1 tab x 1 day, stop (Patient not taking: Reported on 02/23/2020) 21 tablet 0  . prochlorperazine (COMPAZINE) 10 MG tablet Take 10 mg by mouth every 6 (six) hours as needed for nausea or vomiting.     No current facility-administered  medications for this visit.    OBJECTIVE: Spanish speaker who appears stated age  44:   04/03/20 1250  BP: (!) 130/98  Pulse: 88  Resp: 18  Temp: (!) 97.4 F (36.3 C)  SpO2: 99%   Wt Readings from Last 3 Encounters:  04/03/20 200 lb 8 oz (90.9 kg)  03/13/20 196 lb 11.2 oz (89.2 kg)  03/02/20 202 lb 6.4 oz (91.8 kg)   Body mass index is 36.67 kg/m.    ECOG FS:1 - Symptomatic but completely ambulatory  Sclerae unicteric, EOMs intact Wearing a mask No cervical or supraclavicular adenopathy Lungs no rales or rhonchi Heart regular rate and rhythm Abd soft, nontender, positive bowel sounds MSK no focal spinal tenderness, no upper extremity lymphedema Neuro: nonfocal, well oriented, appropriate affect Breasts: Right breast is status post mastectomy.  The incision is healing nicely.  There is no erythema dehiscence or swelling.  Left breast is benign.  Both axillae are benign.   LAB RESULTS:  CMP     Component Value Date/Time   NA 135 03/13/2020 1142   K 3.5 03/13/2020  1142   CL 100 03/13/2020 1142   CO2 28 03/13/2020 1142   GLUCOSE 112 (H) 03/13/2020 1142   BUN 14 03/13/2020 1142   CREATININE 0.80 03/13/2020 1142   CREATININE 0.79 10/06/2019 0826   CALCIUM 8.9 03/13/2020 1142   PROT 7.0 03/13/2020 1142   ALBUMIN 3.4 (L) 03/13/2020 1142   AST 41 03/13/2020 1142   AST 26 10/06/2019 0826   ALT 69 (H) 03/13/2020 1142   ALT 47 (H) 10/06/2019 0826   ALKPHOS 123 03/13/2020 1142   BILITOT 1.1 03/13/2020 1142   BILITOT 0.7 10/06/2019 0826   GFRNONAA >60 03/13/2020 1142   GFRNONAA >60 10/06/2019 0826   GFRAA >60 03/13/2020 1142   GFRAA >60 10/06/2019 0826    No results found for: TOTALPROTELP, ALBUMINELP, A1GS, A2GS, BETS, BETA2SER, GAMS, MSPIKE, SPEI  Lab Results  Component Value Date   WBC 5.0 04/03/2020   NEUTROABS 2.6 04/03/2020   HGB 10.0 (L) 04/03/2020   HCT 30.9 (L) 04/03/2020   MCV 98.7 04/03/2020   PLT 261 04/03/2020    No results found for:  LABCA2  No components found for: HKVQQV956  No results for input(s): INR in the last 168 hours.  No results found for: LABCA2  No results found for: LOV564  No results found for: PPI951  No results found for: OAC166  No results found for: CA2729  No components found for: HGQUANT  No results found for: CEA1 / No results found for: CEA1   No results found for: AFPTUMOR  No results found for: CHROMOGRNA  No results found for: KPAFRELGTCHN, LAMBDASER, KAPLAMBRATIO (kappa/lambda light chains)  No results found for: HGBA, HGBA2QUANT, HGBFQUANT, HGBSQUAN (Hemoglobinopathy evaluation)   No results found for: LDH  No results found for: IRON, TIBC, IRONPCTSAT (Iron and TIBC)  No results found for: FERRITIN  Urinalysis No results found for: COLORURINE, APPEARANCEUR, LABSPEC, PHURINE, GLUCOSEU, HGBUR, BILIRUBINUR, KETONESUR, PROTEINUR, UROBILINOGEN, NITRITE, LEUKOCYTESUR   STUDIES: No results found.   ELIGIBLE FOR AVAILABLE RESEARCH PROTOCOL: no  ASSESSMENT: 44 y.o. [place] Spanish speaker status post right breast upper outer quadrant sites biopsy and right axillary lymph node biopsy 09/30/2019 for a clinical T3 N2, stage IIIA invasive ductal carcinoma, grade 3, estrogen and progesterone receptor negative, HER-2 amplified, with an MIB-1 of 80%.  (a) CT chest abdomen and pelvis and bone scan 10/15/2019 showed no evidence of metastatic disease  (1) genetics testing 10/13/2019 through the Common Hereditary Cancers Panel offered by [facility] found no deleterious mutations in APC, ATM, AXIN2, BARD1, BMPR1A, BRCA1, BRCA2, BRIP1, CDH1, CDKN2A (p14ARF), CDKN2A (p16INK4a), CKD4, CHEK2, CTNNA1, DICER1, EPCAM (Deletion/duplication testing only), GREM1 (promoter region deletion/duplication testing only), KIT, MEN1, MLH1, MSH2, MSH3, MSH6, MUTYH, NBN, NF1, NHTL1, PALB2, PDGFRA, PMS2, POLD1, POLE, PTEN, RAD50, RAD51C, RAD51D, RNF43, SDHB, SDHC, SDHD, SMAD4, SMARCA4. STK11, TP53, TSC1, TSC2,  and VHL.  The following genes were evaluated for sequence changes only: SDHA and HOXB13 c.251G>A variant only.  (2) neoadjuvant chemotherapy consisting of trastuzumab, pertuzumab, docetaxel and carboplatin every 21 days x 6 started 10/19/2019, completed 02/01/2020  (3) trastuzumab and pertuzumab to be continued to total 1 year (through May 2022).  (a) echo 10/15/2019 shows an ejection fraction in the 65-70% range.  (4) status post right modified radical mastectomy on 03/06/2020 showing no residual cancer in the breast or lymph nodes (ypT0 ypN0)  (a) a total of 7 right axillary lymph nodes were removed  (5) adjuvant radiation pending   PLAN: Ayesha at the best possible results from her  neoadjuvant chemotherapy, with a complete pathologic response.  She understands this generally indicates a good long-term prognosis.  She has some numbness over the surgical site and some pain.  Both of these are expected.  The incision looks good and she is interested in obtaining some prostheses and bras.  I have written her the appropriate prescription and given her instructions on how to contact the store.  She will need someone to translate for her of course.  She really does need a translator for every visit including when she is seeing me since she needs to be able to communicate with the nurses in the back and the schedulers and that generally facilitates that  She already has an appointment with radiation oncology for later this week.  I am scheduling her Herceptin for every 21 days through May.  I also put in for a repeat echocardiogram and consulted cardio oncologists  Otherwise she will see me again in 6 weeks.  She knows to call for any other issue that may develop before then  Total encounter time 25 minutes.Lurline Del, MD 04/03/20 12:59 PM Medical Oncology and Hematology [facility] [place], [place] [address] Tel. [phone]    Fax.  [phone]   I, Wilburn Mylar, am acting as scribe for Dr. Virgie Dad. Joshuah Minella.  I, Lurline Del MD, have reviewed the above documentation for accuracy and completeness, and I agree with the above.   *Total Encounter Time as defined by the Centers for Medicare and Medicaid Services includes, in addition to the face-to-face time of a patient visit (documented in the note above) non-face-to-face time: obtaining and reviewing outside history, ordering and reviewing medications, tests or procedures, care coordination (communications with other health care professionals or caregivers) and documentation in the medical record.

## 2020-04-03 ENCOUNTER — Other Ambulatory Visit: Payer: Self-pay

## 2020-04-03 ENCOUNTER — Ambulatory Visit: Payer: Medicaid Other | Admitting: Nurse Practitioner

## 2020-04-03 ENCOUNTER — Inpatient Hospital Stay: Payer: Medicaid Other

## 2020-04-03 ENCOUNTER — Encounter: Payer: Self-pay | Admitting: Licensed Clinical Social Worker

## 2020-04-03 ENCOUNTER — Encounter: Payer: Self-pay | Admitting: *Deleted

## 2020-04-03 ENCOUNTER — Inpatient Hospital Stay (HOSPITAL_BASED_OUTPATIENT_CLINIC_OR_DEPARTMENT_OTHER): Payer: Medicaid Other | Admitting: Oncology

## 2020-04-03 VITALS — BP 119/81 | HR 84 | Temp 98.0°F | Resp 16

## 2020-04-03 VITALS — BP 130/98 | HR 88 | Temp 97.4°F | Resp 18 | Ht 62.0 in | Wt 200.5 lb

## 2020-04-03 DIAGNOSIS — C50411 Malignant neoplasm of upper-outer quadrant of right female breast: Secondary | ICD-10-CM | POA: Diagnosis not present

## 2020-04-03 DIAGNOSIS — Z170 Estrogen receptor positive status [ER+]: Secondary | ICD-10-CM

## 2020-04-03 DIAGNOSIS — Z171 Estrogen receptor negative status [ER-]: Secondary | ICD-10-CM | POA: Diagnosis not present

## 2020-04-03 DIAGNOSIS — C773 Secondary and unspecified malignant neoplasm of axilla and upper limb lymph nodes: Secondary | ICD-10-CM | POA: Diagnosis not present

## 2020-04-03 DIAGNOSIS — Z5112 Encounter for antineoplastic immunotherapy: Secondary | ICD-10-CM | POA: Diagnosis not present

## 2020-04-03 DIAGNOSIS — Z79899 Other long term (current) drug therapy: Secondary | ICD-10-CM | POA: Diagnosis not present

## 2020-04-03 DIAGNOSIS — Z95828 Presence of other vascular implants and grafts: Secondary | ICD-10-CM

## 2020-04-03 LAB — CBC WITH DIFFERENTIAL/PLATELET
Abs Immature Granulocytes: 0.01 10*3/uL (ref 0.00–0.07)
Basophils Absolute: 0 10*3/uL (ref 0.0–0.1)
Basophils Relative: 0 %
Eosinophils Absolute: 0.1 10*3/uL (ref 0.0–0.5)
Eosinophils Relative: 2 %
HCT: 30.9 % — ABNORMAL LOW (ref 36.0–46.0)
Hemoglobin: 10 g/dL — ABNORMAL LOW (ref 12.0–15.0)
Immature Granulocytes: 0 %
Lymphocytes Relative: 38 %
Lymphs Abs: 1.9 10*3/uL (ref 0.7–4.0)
MCH: 31.9 pg (ref 26.0–34.0)
MCHC: 32.4 g/dL (ref 30.0–36.0)
MCV: 98.7 fL (ref 80.0–100.0)
Monocytes Absolute: 0.4 10*3/uL (ref 0.1–1.0)
Monocytes Relative: 7 %
Neutro Abs: 2.6 10*3/uL (ref 1.7–7.7)
Neutrophils Relative %: 53 %
Platelets: 261 10*3/uL (ref 150–400)
RBC: 3.13 MIL/uL — ABNORMAL LOW (ref 3.87–5.11)
RDW: 13.2 % (ref 11.5–15.5)
WBC: 5 10*3/uL (ref 4.0–10.5)
nRBC: 0 % (ref 0.0–0.2)

## 2020-04-03 LAB — COMPREHENSIVE METABOLIC PANEL
ALT: 29 U/L (ref 0–44)
AST: 27 U/L (ref 15–41)
Albumin: 3.7 g/dL (ref 3.5–5.0)
Alkaline Phosphatase: 94 U/L (ref 38–126)
Anion gap: 9 (ref 5–15)
BUN: 15 mg/dL (ref 6–20)
CO2: 24 mmol/L (ref 22–32)
Calcium: 8.9 mg/dL (ref 8.9–10.3)
Chloride: 103 mmol/L (ref 98–111)
Creatinine, Ser: 0.79 mg/dL (ref 0.44–1.00)
GFR, Estimated: >60 mL/min (ref >60)
Glucose, Bld: 100 mg/dL — ABNORMAL HIGH (ref 70–99)
Potassium: 3.8 mmol/L (ref 3.5–5.1)
Sodium: 136 mmol/L (ref 135–145)
Total Bilirubin: 0.6 mg/dL (ref 0.3–1.2)
Total Protein: 7.1 g/dL (ref 6.5–8.1)

## 2020-04-03 LAB — SAMPLE TO BLOOD BANK

## 2020-04-03 LAB — PREGNANCY, URINE: Preg Test, Ur: NEGATIVE

## 2020-04-03 MED ORDER — SODIUM CHLORIDE 0.9% FLUSH
10.0000 mL | INTRAVENOUS | Status: DC | PRN
  Filled 2020-04-03: qty 10

## 2020-04-03 MED ORDER — SODIUM CHLORIDE 0.9 % IV SOLN
420.0000 mg | Freq: Once | INTRAVENOUS | Status: AC
  Administered 2020-04-03: 420 mg via INTRAVENOUS
  Filled 2020-04-03: qty 14

## 2020-04-03 MED ORDER — ACETAMINOPHEN 325 MG PO TABS
650.0000 mg | ORAL_TABLET | Freq: Once | ORAL | Status: AC
  Administered 2020-04-03: 650 mg via ORAL

## 2020-04-03 MED ORDER — ACETAMINOPHEN 325 MG PO TABS
ORAL_TABLET | ORAL | Status: AC
  Filled 2020-04-03: qty 2

## 2020-04-03 MED ORDER — SODIUM CHLORIDE 0.9% FLUSH
10.0000 mL | INTRAVENOUS | Status: DC | PRN
  Administered 2020-04-03: 10 mL
  Filled 2020-04-03: qty 10

## 2020-04-03 MED ORDER — SODIUM CHLORIDE 0.9 % IV SOLN
Freq: Once | INTRAVENOUS | Status: AC
  Filled 2020-04-03: qty 250

## 2020-04-03 MED ORDER — DIPHENHYDRAMINE HCL 25 MG PO CAPS
ORAL_CAPSULE | ORAL | Status: AC
  Filled 2020-04-03: qty 1

## 2020-04-03 MED ORDER — DIPHENHYDRAMINE HCL 25 MG PO CAPS
50.0000 mg | ORAL_CAPSULE | Freq: Once | ORAL | Status: AC
  Administered 2020-04-03: 50 mg via ORAL

## 2020-04-03 MED ORDER — DIPHENHYDRAMINE HCL 25 MG PO CAPS
ORAL_CAPSULE | ORAL | Status: AC
  Filled 2020-04-03: qty 2

## 2020-04-03 MED ORDER — HEPARIN SOD (PORK) LOCK FLUSH 100 UNIT/ML IV SOLN
500.0000 [IU] | Freq: Once | INTRAVENOUS | Status: AC | PRN
  Administered 2020-04-03: 500 [IU]
  Filled 2020-04-03: qty 5

## 2020-04-03 MED ORDER — TRASTUZUMAB-DKST CHEMO 150 MG IV SOLR
6.0000 mg/kg | Freq: Once | INTRAVENOUS | Status: AC
  Administered 2020-04-03: 525 mg via INTRAVENOUS
  Filled 2020-04-03: qty 25

## 2020-04-03 NOTE — Progress Notes (Signed)
Alice [name] CSW Progress Note  Holiday representative met with patient to provide next installment of [employer]. Also provided contact information for pt financial advocate for assistance through [employer]. Patient is stressed that she has not heard from disability yet and needs that to be able to move. She became tearful talking about wanting to be healed and has been using prayer to help cope.  CSW will see pt during next infusion.    Edwinna Areola Alice [name] , LCSW

## 2020-04-03 NOTE — Patient Instructions (Signed)
[facility] Discharge Instructions for Patients Receiving Chemotherapy  Today you received the following chemotherapy agents: trastuzumab, pertuzumab To help prevent nausea and vomiting after your treatment, we encourage you to take your nausea medication as directed.   If you develop nausea and vomiting that is not controlled by your nausea medication, call the clinic.   BELOW ARE SYMPTOMS THAT SHOULD BE REPORTED IMMEDIATELY:  *FEVER GREATER THAN 100.5 F  *CHILLS WITH OR WITHOUT FEVER  NAUSEA AND VOMITING THAT IS NOT CONTROLLED WITH YOUR NAUSEA MEDICATION  *UNUSUAL SHORTNESS OF BREATH  *UNUSUAL BRUISING OR BLEEDING  TENDERNESS IN MOUTH AND THROAT WITH OR WITHOUT PRESENCE OF ULCERS  *URINARY PROBLEMS  *BOWEL PROBLEMS  UNUSUAL RASH Items with * indicate a potential emergency and should be followed up as soon as possible.  Feel free to call the clinic should you have any questions or concerns. The clinic phone number is [phone].  Please show the CHEMO ALERT CARD at check-in to the Emergency Department and triage nurse.   

## 2020-04-05 ENCOUNTER — Other Ambulatory Visit: Payer: Self-pay

## 2020-04-05 ENCOUNTER — Ambulatory Visit
Admission: RE | Admit: 2020-04-05 | Discharge: 2020-04-05 | Disposition: A | Discharge status: Home / Self Care | Payer: Medicaid Other | Source: Ambulatory Visit | Attending: Radiation Oncology | Admitting: Radiation Oncology

## 2020-04-05 ENCOUNTER — Encounter: Payer: Self-pay | Admitting: Radiation Oncology

## 2020-04-05 ENCOUNTER — Encounter: Payer: Self-pay | Admitting: Licensed Clinical Social Worker

## 2020-04-05 DIAGNOSIS — C50411 Malignant neoplasm of upper-outer quadrant of right female breast: Secondary | ICD-10-CM | POA: Diagnosis not present

## 2020-04-05 DIAGNOSIS — Z9221 Personal history of antineoplastic chemotherapy: Secondary | ICD-10-CM | POA: Diagnosis not present

## 2020-04-05 DIAGNOSIS — Z170 Estrogen receptor positive status [ER+]: Secondary | ICD-10-CM

## 2020-04-05 DIAGNOSIS — Z9011 Acquired absence of right breast and nipple: Secondary | ICD-10-CM | POA: Diagnosis not present

## 2020-04-05 NOTE — Progress Notes (Signed)
[facility] Psychosocial Distress Screening Clinical Social Work  Clinical Social Work was referred by distress screening protocol.  The patient scored a 9 on the Psychosocial Distress Thermometer which indicates severe distress. Clinical Social Worker has already been working with patient in efforts to address financial distress while patient unable to work and waiting on decision for social security disability. Financial  Advocates have also been made aware of need and patient may access [employer].  ONCBCN DISTRESS SCREENING 04/05/2020  Screening Type Initial Screening  Distress experienced in past week (1-10) 9  Practical problem type Work/school  Referral to clinical social work   Referral to support programs   Other     Clinical Social Worker follow up needed: Yes.    If yes, follow up plan: CSW will see patient at next infusion appt   Louetta Hollingshead E, LCSW

## 2020-04-05 NOTE — Progress Notes (Signed)
Radiation Oncology         [phone] ________________________________  Outpatient Follow - Conducted via telephone due to current COVID-19 concerns for limiting patient exposure  I spoke with the patient to conduct this consult visit via telephone to spare the patient unnecessary potential exposure in the healthcare setting during the current COVID-19 pandemic. The patient was notified in advance and was offered a [place] meeting to allow for face to face communication but unfortunately reported that they did not have the appropriate resources/technology to support such a visit and instead preferred to proceed with a telephone visit.   Name: Alice [name]        MRN: [id number]  Date of Service: 04/05/2020 DOB: 12/30/1975  JX:[id number], Blanche East, MD  Magrinat, Virgie Dad, MD     REFERRING PHYSICIAN: Magrinat, Virgie Dad, MD   DIAGNOSIS: The encounter diagnosis was Malignant neoplasm of upper-outer quadrant of right breast in female, estrogen receptor positive ([place]).   HISTORY OF PRESENT ILLNESS: Alice [name] is a 44 y.o. female originallyseen in the multidisciplinary breast clinic for a new diagnosis of right breast cancer. The patient was noted to have right skin dimpling and she presented for diagnostic imaging that revealed a 3.3 cm distortion.  Further diagnostic imaging revealed a mass from 10-12 o'clock in the right breast measuring up to 5.2 cm with 5 mm of trabecular skin thickening and one abnormal axillary lymph node.  In imaging the breast, it appears that she did have features of possible lymphedema versus peau d'orange.  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  She proceeded with neoadjuvant chemotherapy between 10/19/19-02/01/20. She did not have visible nodes on MRI due to limited visualization prior to chemo  and after treatment. She had improvement of her primary tumor with chemotherapy. She proceeded with modified radical mastectomy which revealed no residual carcinoma in the breast and no carcinoma in 7 sampled nodes. Fibrosis and mild chronic inflammation was noted consistent with treatment effect. She's contacted today to review the rationale of adjuvant radiotherapy.   PREVIOUS RADIATION THERAPY: No   PAST MEDICAL HISTORY:  Past Medical History:  Diagnosis Date  . Anemia   . Breast cancer ([place])    Right  . Hypertension        PAST SURGICAL HISTORY: Past Surgical History:  Procedure Laterality Date  . BREAST BIOPSY    . CHOLECYSTECTOMY    . IR IMAGING GUIDED PORT INSERTION  10/13/2019  . MASTECTOMY MODIFIED RADICAL Right 03/06/2020   Procedure: MASTECTOMY MODIFIED RADICAL;  Surgeon: Alphonsa Overall, MD;  Location: [facility];  Service: General;  Laterality: Right;  RNFA     FAMILY HISTORY: No family history on file.   SOCIAL HISTORY:  reports that she has never smoked. She has never used smokeless tobacco. She reports that she does not drink alcohol and does not use drugs.  The patient is married and lives in [place].  [place] is originally [place]  She is lived in the [place] approximately 10 years.  She works in a Company secretary.   ALLERGIES: Patient has no known allergies.   MEDICATIONS:  Current Outpatient Medications  Medication Sig Dispense Refill  . lisinopril (ZESTRIL) 10 MG tablet Take 10 mg by mouth daily.    Marland Kitchen doxycycline (VIBRA-TABS) 100 MG tablet Take 1  tablet (100 mg total) by mouth daily. (Patient not taking: Reported on 02/23/2020) 60 tablet 1  . HYDROcodone-acetaminophen (NORCO/VICODIN) 5-325 MG tablet Take 1 tablet by mouth every 6 (six) hours as needed for moderate pain. (Patient not taking: Reported on 04/05/2020) 15 tablet 0  . ibuprofen (ADVIL,MOTRIN) 200 MG tablet Take 400 mg by mouth every 6 (six) hours as needed for fever, headache or  cramping.  (Patient not taking: Reported on 02/23/2020)    . loratadine (CLARITIN) 10 MG tablet Take 1 tablet (10 mg total) by mouth daily. (Patient not taking: Reported on 02/23/2020) 60 tablet 1  . predniSONE (DELTASONE) 5 MG tablet 6 tab x 1 day, 5 tab x 1 day, 4 tab x 1 day, 3 tab x 1 day, 2 tab x 1 day, 1 tab x 1 day, stop (Patient not taking: Reported on 02/23/2020) 21 tablet 0  . prochlorperazine (COMPAZINE) 10 MG tablet Take 10 mg by mouth every 6 (six) hours as needed for nausea or vomiting. (Patient not taking: Reported on 04/05/2020)     No current facility-administered medications for this encounter.     REVIEW OF SYSTEMS: On review of systems, the patient reports that she is doing well since surgery and her tubes are no longer in place. She feels that she is doing well considering all she's been through. No specific complaints are otherwise noted.      PHYSICAL EXAM:  Unable to assess due to encounter.    ECOG = 1  0 - Asymptomatic (Fully active, able to carry on all predisease activities without restriction)  1 - Symptomatic but completely ambulatory (Restricted in physically strenuous activity but ambulatory and able to carry out work of a light or sedentary nature. For example, light housework, office work)  2 - Symptomatic, <50% in bed during the day (Ambulatory and capable of all self care but unable to carry out any work activities. Up and about more than 50% of waking hours)  3 - Symptomatic, >50% in bed, but not bedbound (Capable of only limited self-care, confined to bed or chair 50% or more of waking hours)  4 - Bedbound (Completely disabled. Cannot carry on any self-care. Totally confined to bed or chair)  5 - Death   Eustace Pen MM, Creech RH, Tormey DC, et al. [phone]). "Toxicity and response criteria of the [facility] Group". [place] Oncol. 5 (6): 649-55    LABORATORY DATA:  Lab Results  Component Value Date   WBC 5.0 04/03/2020   HGB  10.0 (L) 04/03/2020   HCT 30.9 (L) 04/03/2020   MCV 98.7 04/03/2020   PLT 261 04/03/2020   Lab Results  Component Value Date   NA 136 04/03/2020   K 3.8 04/03/2020   CL 103 04/03/2020   CO2 24 04/03/2020   Lab Results  Component Value Date   ALT 29 04/03/2020   AST 27 04/03/2020   ALKPHOS 94 04/03/2020   BILITOT 0.6 04/03/2020      RADIOGRAPHY: No results found.     IMPRESSION/PLAN: 1. Stage IIIA, cT3N2aM0 grade 3, HER2 amplified invasive ductal carcinoma of the right breast with complete pathologic response from neoadjuvant chemotherapy. Dr. Lisbeth Renshaw discusses the patient's course and final pathology findings. He reviews the nature of right node positive breast disease. Dr. Lisbeth Renshaw recommends external radiotherapy to the chest wall and regional nodes to reduce the risks of local recurrence. Dr. Jana Hakim also anticipates a role for antiestrogen therapy given her age. We discussed the risks,  benefits, short, and long term effects of radiotherapy, and the patient is interested in proceeding. Dr. Lisbeth Renshaw discusses the delivery and logistics of radiotherapy and anticipates a course of 6 1/2 weeks of radiotherapy to  the chest wall and regional nodes. She will come in tomorrow for simulation at which time she will sign written consent to proceed. 2. Contraceptive Counseling. The patient has not had a cycle since chemotherapy and had a negative pregnancy test earlier this week. She is aware that she should avoid pregnancy with using barrier forms of contraception if she is sexually active during radiotherapy.   Given current concerns for patient exposure during the COVID-19 pandemic, this encounter was conducted via telephone.  The patient has provided two factor identification and has given verbal consent for this type of encounter and has been advised to only accept a meeting of this type in a secure network environment. The time spent during this encounter was 60 minutes including preparation,  discussion, and coordination of the patient's care. The attendants for this meeting include Dr. Lisbeth Renshaw, Hayden Pedro  and [name].  During the encounter, Dr. Lisbeth Renshaw, and Hayden Pedro were located at [facility] Radiation Oncology Department.  Alice [name] was located at home. Communication was also facilitated by the presence of a Spanish-speaking interpreter via [employer].  The above documentation reflects my direct findings during this shared patient visit. Please see the separate note by Dr. Lisbeth Renshaw on this date for the remainder of the patient's plan of care.    Carola Rhine, PAC

## 2020-04-06 ENCOUNTER — Ambulatory Visit
Admission: RE | Admit: 2020-04-06 | Discharge: 2020-04-06 | Disposition: A | Discharge status: Home / Self Care | Payer: Medicaid Other | Source: Ambulatory Visit | Attending: Radiation Oncology | Admitting: Radiation Oncology

## 2020-04-06 ENCOUNTER — Telehealth: Payer: Self-pay | Admitting: Oncology

## 2020-04-06 ENCOUNTER — Other Ambulatory Visit: Payer: Self-pay

## 2020-04-06 DIAGNOSIS — C773 Secondary and unspecified malignant neoplasm of axilla and upper limb lymph nodes: Secondary | ICD-10-CM | POA: Insufficient documentation

## 2020-04-06 DIAGNOSIS — Z170 Estrogen receptor positive status [ER+]: Secondary | ICD-10-CM | POA: Diagnosis not present

## 2020-04-06 DIAGNOSIS — C50411 Malignant neoplasm of upper-outer quadrant of right female breast: Secondary | ICD-10-CM | POA: Diagnosis not present

## 2020-04-06 NOTE — Telephone Encounter (Signed)
Scheduled per 10/25 los. Pt will receive an updated appt calendar per next visit appt notes

## 2020-04-10 DIAGNOSIS — Z419 Encounter for procedure for purposes other than remedying health state, unspecified: Secondary | ICD-10-CM | POA: Diagnosis not present

## 2020-04-12 ENCOUNTER — Encounter: Payer: Self-pay | Admitting: *Deleted

## 2020-04-12 DIAGNOSIS — C50411 Malignant neoplasm of upper-outer quadrant of right female breast: Secondary | ICD-10-CM | POA: Insufficient documentation

## 2020-04-12 DIAGNOSIS — Z170 Estrogen receptor positive status [ER+]: Secondary | ICD-10-CM | POA: Diagnosis not present

## 2020-04-12 DIAGNOSIS — R293 Abnormal posture: Secondary | ICD-10-CM | POA: Diagnosis not present

## 2020-04-12 DIAGNOSIS — Z483 Aftercare following surgery for neoplasm: Secondary | ICD-10-CM | POA: Diagnosis not present

## 2020-04-12 DIAGNOSIS — Z171 Estrogen receptor negative status [ER-]: Secondary | ICD-10-CM | POA: Diagnosis not present

## 2020-04-12 DIAGNOSIS — M25611 Stiffness of right shoulder, not elsewhere classified: Secondary | ICD-10-CM | POA: Diagnosis not present

## 2020-04-13 ENCOUNTER — Ambulatory Visit
Admission: RE | Admit: 2020-04-13 | Discharge: 2020-04-13 | Disposition: A | Discharge status: Home / Self Care | Payer: Medicaid Other | Source: Ambulatory Visit | Attending: Radiation Oncology | Admitting: Radiation Oncology

## 2020-04-13 DIAGNOSIS — Z170 Estrogen receptor positive status [ER+]: Secondary | ICD-10-CM | POA: Diagnosis not present

## 2020-04-13 DIAGNOSIS — C50411 Malignant neoplasm of upper-outer quadrant of right female breast: Secondary | ICD-10-CM | POA: Diagnosis not present

## 2020-04-13 NOTE — Progress Notes (Signed)
Pt here for patient teaching.  Pt given Radiation and You booklet, skin care instructions, Alra deodorant and Radiaplex gel.  Reviewed areas of pertinence such as fatigue, hair loss, skin changes, breast tenderness and breast swelling . Pt able to give teach back of to pat skin and use unscented/gentle soap,apply Radiaplex bid, avoid applying anything to skin within 4 hours of treatment, avoid wearing an under wire bra and to use an electric razor if they must shave. Pt verbalizes understanding of information given and will contact nursing with any questions or concerns.  Interpreter used.   Gloriajean Dell. Leonie Green, BSN

## 2020-04-14 ENCOUNTER — Ambulatory Visit
Admission: RE | Admit: 2020-04-14 | Discharge: 2020-04-14 | Disposition: A | Discharge status: Home / Self Care | Payer: Medicaid Other | Source: Ambulatory Visit | Attending: Radiation Oncology | Admitting: Radiation Oncology

## 2020-04-14 DIAGNOSIS — Z170 Estrogen receptor positive status [ER+]: Secondary | ICD-10-CM | POA: Diagnosis not present

## 2020-04-14 DIAGNOSIS — C50411 Malignant neoplasm of upper-outer quadrant of right female breast: Secondary | ICD-10-CM | POA: Diagnosis not present

## 2020-04-14 MED ORDER — RADIAPLEXRX EX GEL: Freq: Once | CUTANEOUS | Status: AC

## 2020-04-14 MED ORDER — ALRA NON-METALLIC DEODORANT (RAD-ONC)
1.0000 "application " | Freq: Once | TOPICAL | Status: AC
  Administered 2020-04-14: 1 via TOPICAL

## 2020-04-17 ENCOUNTER — Telehealth: Payer: Self-pay

## 2020-04-17 ENCOUNTER — Ambulatory Visit
Admission: RE | Admit: 2020-04-17 | Discharge: 2020-04-17 | Disposition: A | Discharge status: Home / Self Care | Payer: Medicaid Other | Source: Ambulatory Visit | Attending: Radiation Oncology | Admitting: Radiation Oncology

## 2020-04-17 DIAGNOSIS — C50411 Malignant neoplasm of upper-outer quadrant of right female breast: Secondary | ICD-10-CM | POA: Diagnosis not present

## 2020-04-17 DIAGNOSIS — Z170 Estrogen receptor positive status [ER+]: Secondary | ICD-10-CM | POA: Diagnosis not present

## 2020-04-17 NOTE — Telephone Encounter (Signed)
Attempted to call pt to discuss travel plans and infusion schedule. Pt did not answer and no VM box set up.

## 2020-04-18 ENCOUNTER — Ambulatory Visit
Admission: RE | Admit: 2020-04-18 | Discharge: 2020-04-18 | Disposition: A | Discharge status: Home / Self Care | Payer: Medicaid Other | Source: Ambulatory Visit | Attending: Radiation Oncology | Admitting: Radiation Oncology

## 2020-04-18 ENCOUNTER — Other Ambulatory Visit: Payer: Self-pay

## 2020-04-18 DIAGNOSIS — Z170 Estrogen receptor positive status [ER+]: Secondary | ICD-10-CM | POA: Diagnosis not present

## 2020-04-18 DIAGNOSIS — C50411 Malignant neoplasm of upper-outer quadrant of right female breast: Secondary | ICD-10-CM | POA: Diagnosis not present

## 2020-04-19 ENCOUNTER — Ambulatory Visit
Admission: RE | Admit: 2020-04-19 | Discharge: 2020-04-19 | Disposition: A | Discharge status: Home / Self Care | Payer: Medicaid Other | Source: Ambulatory Visit | Attending: Radiation Oncology | Admitting: Radiation Oncology

## 2020-04-19 DIAGNOSIS — C50411 Malignant neoplasm of upper-outer quadrant of right female breast: Secondary | ICD-10-CM | POA: Diagnosis not present

## 2020-04-19 DIAGNOSIS — Z170 Estrogen receptor positive status [ER+]: Secondary | ICD-10-CM | POA: Diagnosis not present

## 2020-04-20 ENCOUNTER — Ambulatory Visit
Admission: RE | Admit: 2020-04-20 | Discharge: 2020-04-20 | Disposition: A | Discharge status: Home / Self Care | Payer: Medicaid Other | Source: Ambulatory Visit | Attending: Radiation Oncology | Admitting: Radiation Oncology

## 2020-04-20 ENCOUNTER — Encounter: Payer: Self-pay | Admitting: Physical Therapy

## 2020-04-20 ENCOUNTER — Other Ambulatory Visit: Payer: Self-pay

## 2020-04-20 ENCOUNTER — Ambulatory Visit: Payer: Medicaid Other | Attending: Family Medicine | Admitting: Physical Therapy

## 2020-04-20 DIAGNOSIS — C50411 Malignant neoplasm of upper-outer quadrant of right female breast: Secondary | ICD-10-CM | POA: Diagnosis not present

## 2020-04-20 DIAGNOSIS — Z170 Estrogen receptor positive status [ER+]: Secondary | ICD-10-CM | POA: Diagnosis not present

## 2020-04-20 DIAGNOSIS — M25611 Stiffness of right shoulder, not elsewhere classified: Secondary | ICD-10-CM | POA: Insufficient documentation

## 2020-04-20 DIAGNOSIS — R293 Abnormal posture: Secondary | ICD-10-CM | POA: Diagnosis not present

## 2020-04-20 DIAGNOSIS — Z171 Estrogen receptor negative status [ER-]: Secondary | ICD-10-CM | POA: Diagnosis not present

## 2020-04-20 DIAGNOSIS — Z483 Aftercare following surgery for neoplasm: Secondary | ICD-10-CM | POA: Diagnosis not present

## 2020-04-20 NOTE — Therapy (Addendum)
[name], [place], [address] Phone: [phone]   Fax:  [phone]  Physical Therapy Treatment  Patient Details  Name: Alice [name] MRN: [id number] Date of Birth: 01-02-1976 Referring Provider (PT): Dr. Alphonsa Overall   Encounter Date: 04/20/2020   PT End of Session - 04/20/20 0954    Visit Number 2    Number of Visits 5    Date for PT Re-Evaluation 05/18/20    PT Start Time 0902    PT Stop Time 1000    PT Time Calculation (min) 58 min    Activity Tolerance Patient tolerated treatment well    Behavior During Therapy [facility] for tasks assessed/performed           Past Medical History:  Diagnosis Date  . Anemia   . Breast cancer ([place])    Right  . Hypertension     Past Surgical History:  Procedure Laterality Date  . BREAST BIOPSY    . CHOLECYSTECTOMY    . IR IMAGING GUIDED PORT INSERTION  10/13/2019  . MASTECTOMY MODIFIED RADICAL Right 03/06/2020   Procedure: MASTECTOMY MODIFIED RADICAL;  Surgeon: Alphonsa Overall, MD;  Location: [facility];  Service: General;  Laterality: Right;  RNFA    There were no vitals filed for this visit.   Subjective Assessment - 04/20/20 0907    Subjective Patient reports she underwent neoadjuvant chemotherapy from 10/19/2019-02/01/2020 followed by a right modified radical mastectomy with 7 negative nodes removed on 03/06/2020. Drains were removed after 2 weeks. She began radiation on 04/13/2020.    Patient is accompained by: Interpreter    Pertinent History Patient was diagnosed on 09/23/2019 with right grade III invasive ductal carcinoma breast cancer. Patient reports she underwent neoadjuvant chemotherapy from 10/19/2019-02/01/2020 followed by a right modified radical mastectomy with 7 negative nodes removed on 03/06/2020 It is ER/PR negative, HER2 positive with a Ki67 of 80%.    Patient Stated Goals Get arm moving better    Currently in Pain? Yes    Pain Score 8     Pain  Location Arm    Pain Orientation Right;Upper;Medial    Pain Descriptors / Indicators Numbness    Pain Type Surgical pain;Neuropathic pain    Pain Onset More than a month ago    Pain Frequency Intermittent    Aggravating Factors  Nothing    Pain Relieving Factors Nothing              [facility] PT Assessment - 04/20/20 0001      Assessment   Medical Diagnosis s/p right modified radical mastectomy    Referring Provider (PT) Dr. Alphonsa Overall    Onset Date/Surgical Date 03/06/20    Hand Dominance Right    Prior Therapy Baselines      Precautions   Precautions Other (comment)    Precaution Comments Right arm lymphedema risk      Restrictions   Weight Bearing Restrictions No      Balance Screen   Has the patient fallen in the past 6 months No    Has the patient had a decrease in activity level because of a fear of falling?  No    Is the patient reluctant to leave their home because of a fear of falling?  No      Home Environment   Living Environment Private residence    Living Arrangements Spouse/significant other;Children   Husband, 27 and 4 y.o. children   Available Help at Discharge Family  Prior Function   Level of Independence Independent    Vocation Unemployed    Leisure She is walking 20 minutes 3 days per week      Cognition   Overall Cognitive Status Within Functional Limits for tasks assessed      Observation/Other Assessments   Observations Incision appears to be healing well but is very tender to palpation. No significant edema present.      Posture/Postural Control   Posture/Postural Control Postural limitations    Postural Limitations Rounded Shoulders;Forward head      ROM / Strength   AROM / PROM / Strength AROM      AROM   AROM Assessment Site Shoulder    Right/Left Shoulder Right    Right Shoulder Extension 44 Degrees    Right Shoulder Flexion 111 Degrees    Right Shoulder ABduction 125 Degrees    Right Shoulder Internal Rotation 63 Degrees     Right Shoulder External Rotation 82 Degrees      Strength   Overall Strength Unable to assess;Due to precautions             LYMPHEDEMA/ONCOLOGY QUESTIONNAIRE - 04/20/20 0001      Type   Cancer Type Right breast cancer      Surgeries   Radical Mastectomy Date 03/06/20    Axillary Lymph Node Dissection Date 03/06/20    Number Lymph Nodes Removed 7      Treatment   Active Chemotherapy Treatment No    Past Chemotherapy Treatment Yes    Date 02/01/20    Active Radiation Treatment Yes    Date 04/13/20    Body Site right chest and axilla    Past Radiation Treatment No    Current Hormone Treatment No    Past Hormone Therapy No      What other symptoms do you have   Are you Having Heaviness or Tightness Yes    Are you having Pain Yes    Are you having pitting edema No    Is it Hard or Difficult finding clothes that fit No    Do you have infections No    Is there Decreased scar mobility Yes    [place]mer Sign No      Lymphedema Assessments   Lymphedema Assessments Upper extremities      Right Upper Extremity Lymphedema   10 cm Proximal to Olecranon Process 34.3 cm    Olecranon Process 28.6 cm    10 cm Proximal to Ulnar Styloid Process 25.4 cm    Just Proximal to Ulnar Styloid Process 17.7 cm    Across Hand at [employer] 20.6 cm    At [place] of 2nd Digit 6.6 cm      Left Upper Extremity Lymphedema   10 cm Proximal to Olecranon Process 34.6 cm    Olecranon Process 27.9 cm    10 cm Proximal to Ulnar Styloid Process 25.7 cm    Just Proximal to Ulnar Styloid Process 17.5 cm    Across Hand at [employer] 19.2 cm    At [place] of 2nd Digit 6.4 cm              Quick Dash - 04/20/20 0001    Open a tight or new jar Unable    Do heavy household chores (wash walls, wash floors) Unable    Carry a shopping bag or briefcase Unable    Wash your back Unable    Use a knife to cut food Unable  Recreational activities in which you take some force or impact through your  arm, shoulder, or hand (golf, hammering, tennis) Unable    During the past week, to what extent has your arm, shoulder or hand problem interfered with your normal social activities with family, friends, neighbors, or groups? Not at all    During the past week, to what extent has your arm, shoulder or hand problem limited your work or other regular daily activities Not at all    Arm, shoulder, or hand pain. Mild    Tingling (pins and needles) in your arm, shoulder, or hand Mild    Difficulty Sleeping No difficulty    DASH Score 59.09 %                          PT Education - 04/20/20 0953    Education Details Self-care for aftercare; HEP; scar massage; ABC class info given in Spanish    Person(s) Educated Patient;Other (comment)   Interpreter   Methods Explanation;Demonstration;Handout    Comprehension Returned demonstration;Verbalized understanding               PT Long Term Goals - 04/20/20 1036      PT LONG TERM GOAL #1   Title Patient will demostrate she has regained full shoulder ROM post operatively compared to baseline assessments.    Baseline Right Shoulder: Extension 50 Degrees, Flexion 151 Degrees, abduction 141 Degrees, Internal Rotation 57 Degrees, External Rotation 79 Degrees    Time 4    Period Weeks    Status New    Target Date 05/18/20      PT LONG TERM GOAL #2   Title Patient will improve her DASH score to be </= 10 for improved overall upper extremity function.    Baseline 0% pre-op; 59.09 post op    Time 4    Period Weeks    Status New    Target Date 05/18/20      PT LONG TERM GOAL #3   Title Patient will verbalize good understanding of lymphedema risk reduction practices.    Baseline No knowledge    Time 4    Period Weeks    Status New    Target Date 05/18/20      PT LONG TERM GOAL #4   Title Patient will increase right shoulder active flexion to >/= 140 degrees and active abduction to >/= 140 degrees for increased ability to reach  overhead.    Baseline 111 degrees flexion and 125 degrees abduction post op    Time 4    Period Weeks    Status New    Target Date 05/18/20                 Plan - 04/20/20 0947    Clinical Impression Statement Patient presents today with an interpreter from [facility]. She underwent neoadjuvant chemotherapy from 10/19/2019-02/01/2020 and then a right modified radical mastectomy with 7 negative axillary lymph nodes removed. Her incision appears to be healing well but her shoulder ROM is poor. She will benefit from PT to regain shoulder ROM, continue education of lymphedema risk reduction, and improve upper extremity function. PT assisted pt with ordering a free knitted knocker as she reports her insurance does not cover a prosthesis.    PT Treatment/Interventions ADLs/Self Care Home Management;Therapeutic exercise;Patient/family education;Manual techniques;Manual lymph drainage;Passive range of motion;Scar mobilization    PT Next Visit Plan Begin PROM right shoulder; AAROM exercises  PT Home Exercise Plan Post op shoulder ROM HEP    Consulted and Agree with Plan of Care Patient           Patient will benefit from skilled therapeutic intervention in order to improve the following deficits and impairments:  Decreased knowledge of precautions, Postural dysfunction, Decreased range of motion, Pain, Impaired UE functional use, Decreased scar mobility  Visit Diagnosis: Malignant neoplasm of upper-outer quadrant of right breast in female, estrogen receptor negative ([place]) - Plan: PT plan of care cert/re-cert  Abnormal posture - Plan: PT plan of care cert/re-cert  Aftercare following surgery for neoplasm - Plan: PT plan of care cert/re-cert  Stiffness of right shoulder, not elsewhere classified - Plan: PT plan of care cert/re-cert     Problem List Patient Active Problem List   Diagnosis Date Noted  . Port-A-Cath in place 11/09/2019  . Genetic testing 10/14/2019  . Malignant  neoplasm of upper-outer quadrant of right breast in female, estrogen receptor positive ([place]) 10/05/2019   [employer] Authorization   Choose one: Rehabilitative  Standardized Assessment or Functional Outcome Tool: See Pain Assessment and Quick DASH  Score or Percent Disability: 59.09  Body Parts Treated (Select each separately):  Shoulder. Overall deficits/functional limitations for body part selected: moderate  Annia Friendly, [place] 04/20/20 10:46 AM   [place], [place], [address] Phone: [phone]   Fax:  [phone]  Name: Caffie Sotto MRN: [id number] Date of Birth: 1976-02-23

## 2020-04-20 NOTE — Patient Instructions (Signed)
            [facility]         [address], [place] [address]         [phone]         Annia Friendly, PT, CLT   After Breast Cancer Class It is recommended you attend the ABC class to be educated on lymphedema risk reduction. This class is free of charge and lasts for 1 hour. It is a 1-time class.  It is in English so I'm going to give you the information in writing instead.  Scar massage Begin gentle massage with coconut oil to your incision site. Do this for a few minutes each day.   Home exercise Program Begin doing the exercises I gave you twice a day.   Follow up PT: It is recommended you return every 3 months for the first 3 years following surgery to be assessed on the SOZO machine for an L-Dex score. This helps prevent clinically significant lymphedema in 95% of patients. These follow up screens are 15 minute appointments that you are not billed for. You are scheduled for 07/04/2019 at 1:00 at [address] [place] [address]  Despus de la clase de cncer de mama Se recomienda que asista a la clase ABC para recibir educacin sobre la reduccin del riesgo de [place]. Esta clase es gratuita y tiene una duracin de 1 hora. Es una clase de una sola vez. Est en ingls, as que te dar la informacin por escrito.  Masaje de cicatrices Empiece a masajear suavemente con aceite de coco en el lugar de la incisin. Haga esto durante unos minutos todos [place].   Programa de ejercicios en casa Empiece a hacer los ejercicios que le [employer] veces al da.   PT de seguimiento: Se recomienda que regrese cada 3 meses durante los primeros 3 aos despus de la ciruga para ser [employer] en la mquina SOZO para obtener una puntuacin L-Dex. Esto ayuda a IT consultant significativo en el 95% de los pacientes. Estas pantallas de seguimiento son citas de 15 minutos por las que no se Web designer. Est programado  para el 24/06/2019 a la 1:00 en [address] [place] [address]

## 2020-04-21 ENCOUNTER — Ambulatory Visit
Admission: RE | Admit: 2020-04-21 | Discharge: 2020-04-21 | Disposition: A | Discharge status: Home / Self Care | Payer: Medicaid Other | Source: Ambulatory Visit | Attending: Radiation Oncology | Admitting: Radiation Oncology

## 2020-04-21 DIAGNOSIS — C50411 Malignant neoplasm of upper-outer quadrant of right female breast: Secondary | ICD-10-CM | POA: Diagnosis not present

## 2020-04-21 DIAGNOSIS — Z170 Estrogen receptor positive status [ER+]: Secondary | ICD-10-CM | POA: Diagnosis not present

## 2020-04-23 NOTE — Progress Notes (Signed)
[facility]  [phone]) [phone] [phone]) [phone]     ID: Alice [name] DOB: 1975-11-25  MR#: [id number]  YOV#:[id number]  Patient Care Team: Drue Flirt, MD as PCP - General (Family Medicine) Mauro Kaufmann, RN as Oncology Nurse Navigator Rockwell Germany, RN as Oncology Nurse Navigator Alphonsa Overall, MD as Consulting Physician (General Surgery) Knut Rondinelli, Virgie Dad, MD as Consulting Physician (Oncology) Kyung Rudd, MD as Consulting Physician (Radiation Oncology) Larey Dresser, MD as Consulting Physician (Cardiology) Chauncey Cruel, MD OTHER MD:  CHIEF COMPLAINT: estrogen receptor negative, 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  She is accompanied by an interpreter  She continues on maintenance trastuzumab and pertuzumab, with a dose due today. Her most recent echocardiogram was 10/15/2019, with an ejection fraction in the 65-70%  Since her last visit, she met with Dr. Lisbeth Renshaw in radiation oncology 04/05/2020. She subsequently began radiation treatment on 04/13/2020. Her final treatment is scheduled for 05/30/2020.    REVIEW OF SYSTEMS: Alice [name] has developed some rosacea and she would like to try to clear that if possible.  She tried to get prostheses through [employer] but they told her her insurance would not be helpful in that regard.  She has started her radiation and so far is tolerating it well.  A detailed review of systems today was otherwise stable   COVID 19 VACCINATION STATUS: As of 04/24/2020 not yet vaccinated   HISTORY OF CURRENT ILLNESS: From the original intake note:  Kela Baccari presented with right breast pain with swelling and skin changes (duskiness and dimpling) and left breast pain at 6 o'clock. She  underwent bilateral diagnostic mammography with tomography and bilateral breast ultrasonography at [facility] on 09/23/2019 showing: breast density category C; 5.2 cm irregular mass in right breast spanning 10-12 o'clock, with marked peau d'orange changes concerning for inflammatory component; 3.3 cm enlarged lymph node in right axilla; indeterminate 1 cm oval mass in left breast at 5 o'clock.  Accordingly on 09/30/2019 she proceeded to biopsy of the right breast area in question. The pathology from this procedure ([id number]) showed: invasive mammary carcinoma, grade 3, e-cadherin positive. Prognostic indicators significant for: estrogen receptor, 0% negative and progesterone receptor, 0% negative. Proliferation marker Ki67 at 80%. HER2 positive by immunohistochemistry (3+).  The biopsied right axillary lymph node was positive for metastatic carcinoma.  She also underwent cyst aspiration of the 1 cm mass in the left breast the same day.  The patient's subsequent history is as detailed below.   PAST MEDICAL HISTORY: Past Medical History:  Diagnosis Date  . Anemia   . Breast cancer ([place])    Right  . Hypertension     PAST SURGICAL HISTORY: Past Surgical History:  Procedure Laterality Date  . BREAST BIOPSY    . CHOLECYSTECTOMY    . IR IMAGING GUIDED PORT INSERTION  10/13/2019  . MASTECTOMY MODIFIED RADICAL Right 03/06/2020   Procedure: MASTECTOMY MODIFIED RADICAL;  Surgeon: Alphonsa Overall, MD;  Location: [facility];  Service: General;  Laterality: Right;  RNFA    FAMILY HISTORY: No family history on file.  As of 09/2019-- her father is 2 and her mother is 57. She has two brothers and one sister. There is no cancer in her family to her knowledge.    GYNECOLOGIC HISTORY:  No LMP recorded. Menarche: 44 years old  Age at first live birth: 44 years old GX P 2 LMP regular periods lasting 3 days; periods stopped after the first chemotherapy dose May 2021 Contraceptive: previously used, has not used  for the last 2 years HRT n/a  Hysterectomy? no BSO? no   SOCIAL HISTORY: (updated 09/2019)  Alice [name] is originally from [place]  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 Husband [name] is currently unemployed but is a good Training and development officer. She lives at home with [name] and their two children. Son [name], age 23, has special needs.  Son [name], age 67, is in high school.    ADVANCED DIRECTIVES: In the absence of any documentation to the contrary, the patient's spouse is their HCPOA.    HEALTH MAINTENANCE: Social History   Tobacco Use  . Smoking status: Never Smoker  . Smokeless tobacco: Never Used  Vaping Use  . Vaping Use: Never used  Substance Use Topics  . Alcohol use: Never  . Drug use: Never     Colonoscopy: n/a (age)  PAP: 2020  Bone density: n/a (age)   No Known Allergies  Current Outpatient Medications  Medication Sig Dispense Refill  . doxycycline (VIBRA-TABS) 100 MG tablet Take 1 tablet (100 mg total) by mouth daily. (Patient not taking: Reported on 02/23/2020) 60 tablet 1  . HYDROcodone-acetaminophen (NORCO/VICODIN) 5-325 MG tablet Take 1 tablet by mouth every 6 (six) hours as needed for moderate pain. (Patient not taking: Reported on 04/05/2020) 15 tablet 0  . ibuprofen (ADVIL,MOTRIN) 200 MG tablet Take 400 mg by mouth every 6 (six) hours as needed for fever, headache or cramping.  (Patient not taking: Reported on 02/23/2020)    . lisinopril (ZESTRIL) 10 MG tablet Take 10 mg by mouth daily.    Marland Kitchen loratadine (CLARITIN) 10 MG tablet Take 1 tablet (10 mg total) by mouth daily. (Patient not taking: Reported on 02/23/2020) 60 tablet 1  . predniSONE (DELTASONE) 5 MG tablet 6 tab x 1 day, 5 tab x 1 day, 4 tab x 1 day, 3 tab x 1 day, 2 tab x 1 day, 1 tab x 1 day, stop (Patient not taking: Reported on 02/23/2020) 21 tablet 0  . prochlorperazine (COMPAZINE) 10 MG tablet Take 10 mg by mouth every 6 (six) hours as needed for nausea or vomiting. (Patient not  taking: Reported on 04/05/2020)     No current facility-administered medications for this visit.    OBJECTIVE: Spanish speaker in no acute distress  Vitals:   04/24/20 1026  BP: (!) 132/95  Pulse: 90  Resp: 19  Temp: 98 F (36.7 C)  SpO2: 97%   Wt Readings from Last 3 Encounters:  04/24/20 201 lb 4.8 oz (91.3 kg)  04/03/20 200 lb 8 oz (90.9 kg)  03/13/20 196 lb 11.2 oz (89.2 kg)   Body mass index is 36.82 kg/m.    ECOG FS:1 - Symptomatic but completely ambulatory  Sclerae unicteric, EOMs intact Wearing a mask No cervical or supraclavicular adenopathy Lungs no rales or rhonchi Heart regular rate and rhythm Abd soft, nontender, positive bowel sounds MSK no focal spinal tenderness, no upper extremity lymphedema Neuro: nonfocal, well oriented, appropriate affect Breasts: The right breast is status post mastectomy.  There is no evidence of local recurrence.  The incision is healing well.  The left breast is unremarkable.  Both axillae are benign.   LAB RESULTS:  CMP     Component Value Date/Time   NA 136 04/03/2020 1225   K  3.8 04/03/2020 1225   CL 103 04/03/2020 1225   CO2 24 04/03/2020 1225   GLUCOSE 100 (H) 04/03/2020 1225   BUN 15 04/03/2020 1225   CREATININE 0.79 04/03/2020 1225   CREATININE 0.79 10/06/2019 0826   CALCIUM 8.9 04/03/2020 1225   PROT 7.1 04/03/2020 1225   ALBUMIN 3.7 04/03/2020 1225   AST 27 04/03/2020 1225   AST 26 10/06/2019 0826   ALT 29 04/03/2020 1225   ALT 47 (H) 10/06/2019 0826   ALKPHOS 94 04/03/2020 1225   BILITOT 0.6 04/03/2020 1225   BILITOT 0.7 10/06/2019 0826   GFRNONAA >60 04/03/2020 1225   GFRNONAA >60 10/06/2019 0826   GFRAA >60 03/13/2020 1142   GFRAA >60 10/06/2019 0826    No results found for: TOTALPROTELP, ALBUMINELP, A1GS, A2GS, BETS, BETA2SER, GAMS, MSPIKE, SPEI  Lab Results  Component Value Date   WBC 4.5 04/24/2020   NEUTROABS 2.9 04/24/2020   HGB 11.1 (L) 04/24/2020   HCT 32.8 (L) 04/24/2020   MCV 94.0  04/24/2020   PLT 281 04/24/2020    No results found for: LABCA2  No components found for: [id number]  No results for input(s): INR in the last 168 hours.  No results found for: LABCA2  No results found for: UIV146  No results found for: WVX427  No results found for: ARW110  No results found for: CA2729  No components found for: HGQUANT  No results found for: CEA1 / No results found for: CEA1   No results found for: AFPTUMOR  No results found for: CHROMOGRNA  No results found for: KPAFRELGTCHN, LAMBDASER, KAPLAMBRATIO (kappa/lambda light chains)  No results found for: HGBA, HGBA2QUANT, HGBFQUANT, HGBSQUAN (Hemoglobinopathy evaluation)   No results found for: LDH  No results found for: IRON, TIBC, IRONPCTSAT (Iron and TIBC)  No results found for: FERRITIN  Urinalysis No results found for: COLORURINE, APPEARANCEUR, LABSPEC, PHURINE, GLUCOSEU, HGBUR, BILIRUBINUR, KETONESUR, PROTEINUR, UROBILINOGEN, NITRITE, LEUKOCYTESUR   STUDIES: No results found.   ELIGIBLE FOR AVAILABLE RESEARCH PROTOCOL: no  ASSESSMENT: 44 y.o. [place] Spanish speaker status post right breast upper outer quadrant sites biopsy and right axillary lymph node biopsy 09/30/2019 for a clinical T3 N2, stage IIIA invasive ductal carcinoma, grade 3, estrogen and progesterone receptor negative, HER-2 amplified, with an MIB-1 of 80%.  (a) CT chest abdomen and pelvis and bone scan 10/15/2019 showed no evidence of metastatic disease  (1) genetics testing 10/13/2019 through the Common Hereditary Cancers Panel offered by [facility] found no deleterious mutations in APC, ATM, AXIN2, BARD1, BMPR1A, BRCA1, BRCA2, BRIP1, CDH1, CDKN2A (p14ARF), CDKN2A (p16INK4a), CKD4, CHEK2, CTNNA1, DICER1, EPCAM (Deletion/duplication testing only), GREM1 (promoter region deletion/duplication testing only), KIT, MEN1, MLH1, MSH2, MSH3, MSH6, MUTYH, NBN, NF1, NHTL1, PALB2, PDGFRA, PMS2, POLD1, POLE, PTEN, RAD50, RAD51C, RAD51D, RNF43,  SDHB, SDHC, SDHD, SMAD4, SMARCA4. STK11, TP53, TSC1, TSC2, and VHL.  The following genes were evaluated for sequence changes only: SDHA and HOXB13 c.251G>A variant only.  (2) neoadjuvant chemotherapy consisting of trastuzumab, pertuzumab, docetaxel and carboplatin every 21 days x 6 started 10/19/2019, completed 02/01/2020  (3) trastuzumab and pertuzumab to be continued to total 1 year (through May 2022).  (a) echo 10/15/2019 shows an ejection fraction in the 65-70% range.  (b) echo 04/25/2020  (4) status post right modified radical mastectomy on 03/06/2020 showing no residual cancer in the breast or lymph nodes (ypT0 ypN0)  (a) a total of 7 right axillary lymph nodes were removed  (5) adjuvant radiation to be completed 05/30/2020   PLAN: Alice [name]  is still recovering from her surgery.  I have encouraged her to be as active as possible especially as she will become more fatigued from radiation.  We also talked about skin changes that are going to be coming up.  She is scheduled for echo tomorrow.  We are proceeding with treatment today and every 21 days through May.  I have entered the appropriate orders  She has not yet been vaccinated.  I encouraged her to receive the [name] vaccine in particular  She knows to call for any other issue that may develop before her next visit with me which will be in January  Total encounter time 25 minutes.  Lurline Del, MD 04/24/20 10:30 AM Medical Oncology and Hematology [facility] [place], [place] [address] Tel. [phone]    Fax. [phone]   I, Wilburn Mylar, am acting as scribe for Dr. Virgie Dad. Ryleigh Esqueda.  I, Lurline Del MD, have reviewed the above documentation for accuracy and completeness, and I agree with the above.   *Total Encounter Time as defined by the Centers for Medicare and Medicaid Services includes, in addition to the face-to-face time of a patient visit (documented in the note above)  non-face-to-face time: obtaining and reviewing outside history, ordering and reviewing medications, tests or procedures, care coordination (communications with other health care professionals or caregivers) and documentation in the medical record.

## 2020-04-24 ENCOUNTER — Other Ambulatory Visit: Payer: Self-pay

## 2020-04-24 ENCOUNTER — Inpatient Hospital Stay: Payer: Medicaid Other | Attending: Oncology

## 2020-04-24 ENCOUNTER — Inpatient Hospital Stay (HOSPITAL_BASED_OUTPATIENT_CLINIC_OR_DEPARTMENT_OTHER): Payer: Medicaid Other | Admitting: Oncology

## 2020-04-24 ENCOUNTER — Inpatient Hospital Stay: Payer: Medicaid Other

## 2020-04-24 ENCOUNTER — Ambulatory Visit
Admission: RE | Admit: 2020-04-24 | Discharge: 2020-04-24 | Disposition: A | Discharge status: Home / Self Care | Payer: Medicaid Other | Source: Ambulatory Visit | Attending: Radiation Oncology | Admitting: Radiation Oncology

## 2020-04-24 ENCOUNTER — Encounter: Payer: Self-pay | Admitting: Licensed Clinical Social Worker

## 2020-04-24 VITALS — BP 132/95 | HR 90 | Temp 98.0°F | Resp 19 | Ht 62.0 in | Wt 201.3 lb

## 2020-04-24 VITALS — BP 127/98 | HR 78 | Resp 20

## 2020-04-24 DIAGNOSIS — Z23 Encounter for immunization: Secondary | ICD-10-CM | POA: Diagnosis not present

## 2020-04-24 DIAGNOSIS — C50411 Malignant neoplasm of upper-outer quadrant of right female breast: Secondary | ICD-10-CM | POA: Insufficient documentation

## 2020-04-24 DIAGNOSIS — Z170 Estrogen receptor positive status [ER+]: Secondary | ICD-10-CM | POA: Diagnosis not present

## 2020-04-24 DIAGNOSIS — C773 Secondary and unspecified malignant neoplasm of axilla and upper limb lymph nodes: Secondary | ICD-10-CM | POA: Insufficient documentation

## 2020-04-24 DIAGNOSIS — Z171 Estrogen receptor negative status [ER-]: Secondary | ICD-10-CM | POA: Insufficient documentation

## 2020-04-24 DIAGNOSIS — Z5112 Encounter for antineoplastic immunotherapy: Secondary | ICD-10-CM | POA: Diagnosis not present

## 2020-04-24 DIAGNOSIS — Z79899 Other long term (current) drug therapy: Secondary | ICD-10-CM | POA: Insufficient documentation

## 2020-04-24 DIAGNOSIS — Z95828 Presence of other vascular implants and grafts: Secondary | ICD-10-CM

## 2020-04-24 LAB — COMPREHENSIVE METABOLIC PANEL
ALT: 24 U/L (ref 0–44)
AST: 20 U/L (ref 15–41)
Albumin: 3.9 g/dL (ref 3.5–5.0)
Alkaline Phosphatase: 98 U/L (ref 38–126)
Anion gap: 11 (ref 5–15)
BUN: 15 mg/dL (ref 6–20)
CO2: 25 mmol/L (ref 22–32)
Calcium: 9.3 mg/dL (ref 8.9–10.3)
Chloride: 103 mmol/L (ref 98–111)
Creatinine, Ser: 0.81 mg/dL (ref 0.44–1.00)
GFR, Estimated: >60 mL/min (ref >60)
Glucose, Bld: 104 mg/dL — ABNORMAL HIGH (ref 70–99)
Potassium: 3.8 mmol/L (ref 3.5–5.1)
Sodium: 139 mmol/L (ref 135–145)
Total Bilirubin: 0.9 mg/dL (ref 0.3–1.2)
Total Protein: 7.4 g/dL (ref 6.5–8.1)

## 2020-04-24 LAB — CBC WITH DIFFERENTIAL/PLATELET
Abs Immature Granulocytes: 0.01 10*3/uL (ref 0.00–0.07)
Basophils Absolute: 0 10*3/uL (ref 0.0–0.1)
Basophils Relative: 0 %
Eosinophils Absolute: 0.1 10*3/uL (ref 0.0–0.5)
Eosinophils Relative: 1 %
HCT: 32.8 % — ABNORMAL LOW (ref 36.0–46.0)
Hemoglobin: 11.1 g/dL — ABNORMAL LOW (ref 12.0–15.0)
Immature Granulocytes: 0 %
Lymphocytes Relative: 24 %
Lymphs Abs: 1.1 10*3/uL (ref 0.7–4.0)
MCH: 31.8 pg (ref 26.0–34.0)
MCHC: 33.8 g/dL (ref 30.0–36.0)
MCV: 94 fL (ref 80.0–100.0)
Monocytes Absolute: 0.4 10*3/uL (ref 0.1–1.0)
Monocytes Relative: 9 %
Neutro Abs: 2.9 10*3/uL (ref 1.7–7.7)
Neutrophils Relative %: 66 %
Platelets: 281 10*3/uL (ref 150–400)
RBC: 3.49 MIL/uL — ABNORMAL LOW (ref 3.87–5.11)
RDW: 11.9 % (ref 11.5–15.5)
WBC: 4.5 10*3/uL (ref 4.0–10.5)
nRBC: 0 % (ref 0.0–0.2)

## 2020-04-24 LAB — PREGNANCY, URINE: Preg Test, Ur: NEGATIVE

## 2020-04-24 MED ORDER — SODIUM CHLORIDE 0.9% FLUSH
10.0000 mL | INTRAVENOUS | Status: DC | PRN
  Administered 2020-04-24: 10 mL
  Filled 2020-04-24: qty 10

## 2020-04-24 MED ORDER — ACETAMINOPHEN 325 MG PO TABS
650.0000 mg | ORAL_TABLET | Freq: Once | ORAL | Status: AC
  Administered 2020-04-24: 650 mg via ORAL

## 2020-04-24 MED ORDER — INFLUENZA VAC SPLIT QUAD 0.5 ML IM SUSY
PREFILLED_SYRINGE | INTRAMUSCULAR | Status: AC
  Filled 2020-04-24: qty 0.5

## 2020-04-24 MED ORDER — INFLUENZA VAC SPLIT QUAD 0.5 ML IM SUSY
0.5000 mL | PREFILLED_SYRINGE | Freq: Once | INTRAMUSCULAR | Status: AC
  Administered 2020-04-24: 0.5 mL via INTRAMUSCULAR

## 2020-04-24 MED ORDER — SODIUM CHLORIDE 0.9 % IV SOLN
420.0000 mg | Freq: Once | INTRAVENOUS | Status: AC
  Administered 2020-04-24: 420 mg via INTRAVENOUS
  Filled 2020-04-24: qty 14

## 2020-04-24 MED ORDER — METRONIDAZOLE 1 % EX GEL: Freq: Every day | CUTANEOUS | 0 refills | Status: DC

## 2020-04-24 MED ORDER — TRASTUZUMAB-DKST CHEMO 150 MG IV SOLR
6.0000 mg/kg | Freq: Once | INTRAVENOUS | Status: AC
  Administered 2020-04-24: 525 mg via INTRAVENOUS
  Filled 2020-04-24: qty 25

## 2020-04-24 MED ORDER — SODIUM CHLORIDE 0.9 % IV SOLN
Freq: Once | INTRAVENOUS | Status: AC
  Filled 2020-04-24: qty 250

## 2020-04-24 MED ORDER — DIPHENHYDRAMINE HCL 25 MG PO CAPS
ORAL_CAPSULE | ORAL | Status: AC
  Filled 2020-04-24: qty 2

## 2020-04-24 MED ORDER — HEPARIN SOD (PORK) LOCK FLUSH 100 UNIT/ML IV SOLN
500.0000 [IU] | Freq: Once | INTRAVENOUS | Status: AC | PRN
  Administered 2020-04-24: 500 [IU]
  Filled 2020-04-24: qty 5

## 2020-04-24 MED ORDER — DIPHENHYDRAMINE HCL 25 MG PO CAPS
50.0000 mg | ORAL_CAPSULE | Freq: Once | ORAL | Status: AC
  Administered 2020-04-24: 50 mg via ORAL

## 2020-04-24 MED ORDER — ACETAMINOPHEN 325 MG PO TABS
ORAL_TABLET | ORAL | Status: AC
  Filled 2020-04-24: qty 2

## 2020-04-24 NOTE — Progress Notes (Signed)
OK to treat today per Dr Jana Hakim. Pt is scheduled for echo in 2 days.

## 2020-04-24 NOTE — Progress Notes (Signed)
[facility] CSW Progress Note  Clinical Social Worker met with patient provide ongoing support (interpreter on phone to assist). Patient still has not heard back from disability determination services. Last spoke with them with this CSW on 03/13/20 when they confirmed that all paperwork had been received and was with a reviewer.  Attempted to contact [facility] again today but were on hold for over 45 minutes with no response.    CSW reached out to [facility] to determine if they have any advice for patient as it has been over 6 months since she applied.    Michelle E Zavala , LCSW 

## 2020-04-24 NOTE — Patient Instructions (Addendum)
[facility] Discharge Instructions for Patients Receiving Chemotherapy  Today you received the following chemotherapy agents Trastuzumab-dkst ([place]) & Pertuzumab ([facility]).  To help prevent nausea and vomiting after your treatment, we encourage you to take your nausea medication as prescribed.   If you develop nausea and vomiting that is not controlled by your nausea medication, call the clinic.   BELOW ARE SYMPTOMS THAT SHOULD BE REPORTED IMMEDIATELY:  *FEVER GREATER THAN 100.5 F  *CHILLS WITH OR WITHOUT FEVER  NAUSEA AND VOMITING THAT IS NOT CONTROLLED WITH YOUR NAUSEA MEDICATION  *UNUSUAL SHORTNESS OF BREATH  *UNUSUAL BRUISING OR BLEEDING  TENDERNESS IN MOUTH AND THROAT WITH OR WITHOUT PRESENCE OF ULCERS  *URINARY PROBLEMS  *BOWEL PROBLEMS  UNUSUAL RASH Items with * indicate a potential emergency and should be followed up as soon as possible.  Feel free to call the clinic should you have any questions or concerns. The clinic phone number is [phone].  Please show the [facility] at check-in to the Emergency Department and triage nurse.  Influenza Virus Vaccine injection What is this medicine? INFLUENZA VIRUS VACCINE (in floo EN zuh VAHY ruhs vak SEEN) helps to reduce the risk of getting influenza also known as the flu. The vaccine only helps protect you against some strains of the flu. This medicine may be used for other purposes; ask your health care provider or pharmacist if you have questions. COMMON BRAND NAME(S): Afluria, Afluria Quadrivalent, Agriflu, Alfuria, FLUAD, Fluarix, Fluarix Quadrivalent, Flublok, Flublok Quadrivalent, FLUCELVAX, FLUCELVAX Quadrivalent, Flulaval, Flulaval Quadrivalent, Fluvirin, Fluzone, Fluzone High-Dose, Fluzone Intradermal, Fluzone Quadrivalent What should I tell my health care provider before I take this medicine? They need to know if you have any of these conditions:  bleeding disorder like  hemophilia  fever or infection  Guillain-Barre syndrome or other neurological problems  immune system problems  infection with the human immunodeficiency virus (HIV) or AIDS  low blood platelet counts  multiple sclerosis  an unusual or allergic reaction to influenza virus vaccine, latex, other medicines, foods, dyes, or preservatives. Different brands of vaccines contain different allergens. Some may contain latex or eggs. Talk to your doctor about your allergies to make sure that you get the right vaccine.  pregnant or trying to get pregnant  breast-feeding How should I use this medicine? This vaccine is for injection into a muscle or under the skin. It is given by a health care professional. A copy of Vaccine Information Statements will be given before each vaccination. Read this sheet carefully each time. The sheet may change frequently. Talk to your healthcare provider to see which vaccines are right for you. Some vaccines should not be used in all age groups. Overdosage: If you think you have taken too much of this medicine contact a poison control center or emergency room at once. NOTE: This medicine is only for you. Do not share this medicine with others. What if I miss a dose? This does not apply. What may interact with this medicine?  chemotherapy or radiation therapy  medicines that lower your immune system like etanercept, anakinra, infliximab, and adalimumab  medicines that treat or prevent blood clots like warfarin  phenytoin  steroid medicines like prednisone or cortisone  theophylline  vaccines This list may not describe all possible interactions. Give your health care provider a list of all the medicines, herbs, non-prescription drugs, or dietary supplements you use. Also tell them if you smoke, drink alcohol, or use illegal drugs. Some items may interact with  your medicine. What should I watch for while using this medicine? Report any side effects that do  not go away within 3 days to your doctor or health care professional. Call your health care provider if any unusual symptoms occur within 6 weeks of receiving this vaccine. You may still catch the flu, but the illness is not usually as bad. You cannot get the flu from the vaccine. The vaccine will not protect against colds or other illnesses that may cause fever. The vaccine is needed every year. What side effects may I notice from receiving this medicine? Side effects that you should report to your doctor or health care professional as soon as possible:  allergic reactions like skin rash, itching or hives, swelling of the face, lips, or tongue Side effects that usually do not require medical attention (report to your doctor or health care professional if they continue or are bothersome):  fever  headache  muscle aches and pains  pain, tenderness, redness, or swelling at the injection site  tiredness This list may not describe all possible side effects. Call your doctor for medical advice about side effects. You may report side effects to FDA at 1-800-FDA-1088. Where should I keep my medicine? The vaccine will be given by a health care professional in a clinic, pharmacy, doctor's office, or other health care setting. You will not be given vaccine doses to store at home. NOTE: This sheet is a summary. It may not cover all possible information. If you have questions about this medicine, talk to your doctor, pharmacist, or health care provider.  2020 Elsevier/Gold Standard (2018-04-21 08:45:43)

## 2020-04-24 NOTE — Patient Instructions (Signed)

## 2020-04-25 ENCOUNTER — Other Ambulatory Visit: Payer: Self-pay

## 2020-04-25 ENCOUNTER — Ambulatory Visit (HOSPITAL_COMMUNITY): Payer: Medicaid Other | Attending: Cardiovascular Disease

## 2020-04-25 ENCOUNTER — Ambulatory Visit
Admission: RE | Admit: 2020-04-25 | Discharge: 2020-04-25 | Disposition: A | Discharge status: Home / Self Care | Payer: Medicaid Other | Source: Ambulatory Visit | Attending: Radiation Oncology | Admitting: Radiation Oncology

## 2020-04-25 DIAGNOSIS — C50411 Malignant neoplasm of upper-outer quadrant of right female breast: Secondary | ICD-10-CM | POA: Diagnosis not present

## 2020-04-25 DIAGNOSIS — Z170 Estrogen receptor positive status [ER+]: Secondary | ICD-10-CM | POA: Diagnosis not present

## 2020-04-25 DIAGNOSIS — Z0189 Encounter for other specified special examinations: Secondary | ICD-10-CM | POA: Diagnosis not present

## 2020-04-25 LAB — ECHOCARDIOGRAM COMPLETE
Area-P 1/2: 2.73 cm2
S' Lateral: 2.5 cm

## 2020-04-26 ENCOUNTER — Ambulatory Visit
Admission: RE | Admit: 2020-04-26 | Discharge: 2020-04-26 | Disposition: A | Discharge status: Home / Self Care | Payer: Medicaid Other | Source: Ambulatory Visit | Attending: Radiation Oncology | Admitting: Radiation Oncology

## 2020-04-26 ENCOUNTER — Encounter: Payer: Self-pay | Admitting: Licensed Clinical Social Worker

## 2020-04-26 ENCOUNTER — Telehealth: Payer: Self-pay | Admitting: Oncology

## 2020-04-26 DIAGNOSIS — Z170 Estrogen receptor positive status [ER+]: Secondary | ICD-10-CM | POA: Diagnosis not present

## 2020-04-26 DIAGNOSIS — C50411 Malignant neoplasm of upper-outer quadrant of right female breast: Secondary | ICD-10-CM | POA: Diagnosis not present

## 2020-04-26 NOTE — Progress Notes (Signed)
[facility] CSW Progress Note  [facility] representative met with patient to follow-up on disability question and breast prostheses/forms.  After consulting with [facility], CSW informed patient that there is nothing to speed up the disability process. Patient can call the office to ensure that the medical records were requested and received, but then the disability determination office will have to follow their protocol.  For breast prostheses, there were no silicone or other material forms available in pt's size in our office. CSW was able to provide knitted knockers to the patient in her size today.    Christeen Douglas , LCSW

## 2020-04-26 NOTE — Telephone Encounter (Signed)
Scheduled appts per 11/17 los. Pt to get updated appt calendar per appt notes.

## 2020-04-27 ENCOUNTER — Other Ambulatory Visit: Payer: Self-pay

## 2020-04-27 ENCOUNTER — Ambulatory Visit: Payer: Medicaid Other | Admitting: Rehabilitation

## 2020-04-27 ENCOUNTER — Encounter: Payer: Self-pay | Admitting: Rehabilitation

## 2020-04-27 ENCOUNTER — Ambulatory Visit
Admission: RE | Admit: 2020-04-27 | Discharge: 2020-04-27 | Disposition: A | Discharge status: Home / Self Care | Payer: Medicaid Other | Source: Ambulatory Visit | Attending: Radiation Oncology | Admitting: Radiation Oncology

## 2020-04-27 DIAGNOSIS — Z483 Aftercare following surgery for neoplasm: Secondary | ICD-10-CM

## 2020-04-27 DIAGNOSIS — Z171 Estrogen receptor negative status [ER-]: Secondary | ICD-10-CM | POA: Diagnosis not present

## 2020-04-27 DIAGNOSIS — M25611 Stiffness of right shoulder, not elsewhere classified: Secondary | ICD-10-CM

## 2020-04-27 DIAGNOSIS — Z170 Estrogen receptor positive status [ER+]: Secondary | ICD-10-CM | POA: Diagnosis not present

## 2020-04-27 DIAGNOSIS — R293 Abnormal posture: Secondary | ICD-10-CM | POA: Diagnosis not present

## 2020-04-27 DIAGNOSIS — C50411 Malignant neoplasm of upper-outer quadrant of right female breast: Secondary | ICD-10-CM | POA: Diagnosis not present

## 2020-04-27 NOTE — Therapy (Signed)
[place], [place], [address] Phone: [phone]   Fax:  [phone]  Physical Therapy Treatment  Patient Details  Name: Alice [name] MRN: [id number] Date of Birth: 1976-06-07 Referring Provider (PT): Dr. Alphonsa Overall   Encounter Date: 04/27/2020   PT End of Session - 04/27/20 1500    Visit Number 3    Number of Visits 5    Date for PT Re-Evaluation 05/18/20    PT Start Time 1402    PT Stop Time 1450    PT Time Calculation (min) 48 min    Activity Tolerance Patient tolerated treatment well    Behavior During Therapy [facility] for tasks assessed/performed           Past Medical History:  Diagnosis Date  . Anemia   . Breast cancer ([place])    Right  . Hypertension     Past Surgical History:  Procedure Laterality Date  . BREAST BIOPSY    . CHOLECYSTECTOMY    . IR IMAGING GUIDED PORT INSERTION  10/13/2019  . MASTECTOMY MODIFIED RADICAL Right 03/06/2020   Procedure: MASTECTOMY MODIFIED RADICAL;  Surgeon: Alphonsa Overall, MD;  Location: [facility];  Service: General;  Laterality: Right;  RNFA    There were no vitals filed for this visit.   Subjective Assessment - 04/27/20 1400    Subjective I already had radiation today. I am not having that arm pain anymore    Patient is accompained by: Interpreter    Pertinent History Patient was diagnosed on 09/23/2019 with right grade III invasive ductal carcinoma breast cancer. Patient reports she underwent neoadjuvant chemotherapy from 10/19/2019-02/01/2020 followed by a right modified radical mastectomy with 7 negative nodes removed on 03/06/2020 It is ER/PR negative, HER2 positive with a Ki67 of 80%.    Currently in Pain? No/denies              [facility] PT Assessment - 04/27/20 0001      AROM   Right Shoulder Flexion 141 Degrees    Right Shoulder ABduction 156 Degrees                         [facility] Adult PT Treatment/Exercise - 04/27/20 0001       Self-Care   Self-Care Other Self-Care Comments    Other Self-Care Comments  gave pt risk reduction in spanish which interpretor stated was translated well with review of all principles       Exercises   Exercises Shoulder      Shoulder Exercises: Supine   Flexion AAROM;Both;10 reps    Flexion Limitations with cueing but easy for patient      Shoulder Exercises: Pulleys   Flexion 2 minutes    Flexion Limitations with cueing for initial performance    ABduction 2 minutes    ABduction Limitations with cueing for initial performance     Other Pulley Exercises pulleys overall easy       Shoulder Exercises: Therapy Ball   Flexion Both;5 reps      Manual Therapy   Manual Therapy Passive ROM    Passive ROM of the Rt shoulder into flexion, abduction, Er, and D2 with only mild limitation                  PT Education - 04/27/20 1500    Education Details risk reduction    Person(s) Educated Patient    Methods Explanation;Handout    Comprehension Verbalized understanding  PT Long Term Goals - 04/27/20 1549      PT LONG TERM GOAL #1   Title Patient will demostrate she has regained full shoulder ROM post operatively compared to baseline assessments.    Baseline Flexion 151, Abduction 141; on 04/27/20: Flexion 141, Abd: 156    Status Partially Met      PT LONG TERM GOAL #2   Title Patient will improve her DASH score to be </= 10 for improved overall upper extremity function.    Status On-going      PT LONG TERM GOAL #3   Title Patient will verbalize good understanding of lymphedema risk reduction practices.      PT LONG TERM GOAL #4   Title Patient will increase right shoulder active flexion to >/= 140 degrees and active abduction to >/= 140 degrees for increased ability to reach overhead.    Baseline 151    Status Achieved                 Plan - 04/27/20 1544    Clinical Impression Statement Pt presents today with much improved AROM, no  pain, and reporting that she feels very good.  Overall pt would like to not have any more PT visits but pt was agreeable to meeting one more time and possibly post radiation after education on effects of radiation.  Education today on lymphedema risk reduction with handout given as well as education on lymphedema surveillance.  Pt is very pleased about status but does have some mild AROM restruction and some latissimus region tightness with overhead flexion.    PT Next Visit Plan recheck AROM, any final exercises to give like supine scap? recheck post radiation or education on self care throughout radiation    Consulted and Agree with Plan of Care Patient           Patient will benefit from skilled therapeutic intervention in order to improve the following deficits and impairments:     Visit Diagnosis: Malignant neoplasm of upper-outer quadrant of right breast in female, estrogen receptor negative ([place])  Abnormal posture  Aftercare following surgery for neoplasm  Stiffness of right shoulder, not elsewhere classified     Problem List Patient Active Problem List   Diagnosis Date Noted  . Port-A-Cath in place 11/09/2019  . Genetic testing 10/14/2019  . Malignant neoplasm of upper-outer quadrant of right breast in female, estrogen receptor positive ([place]) 10/05/2019    [name] 04/27/2020, 3:54 PM  [name], [place], [address] Phone: [phone]   Fax:  [phone]  Name: Alice [name] MRN: [id number] Date of Birth: [birth year]

## 2020-04-27 NOTE — Progress Notes (Signed)
CSW was able to connect patient with [facility] to receive donated breast prosthesis. Appt 11/22 at 2pm. Patient aware.   Christeen Douglas, LCSW

## 2020-04-28 ENCOUNTER — Ambulatory Visit
Admission: RE | Admit: 2020-04-28 | Discharge: 2020-04-28 | Disposition: A | Discharge status: Home / Self Care | Payer: Medicaid Other | Source: Ambulatory Visit | Attending: Radiation Oncology | Admitting: Radiation Oncology

## 2020-04-28 DIAGNOSIS — Z170 Estrogen receptor positive status [ER+]: Secondary | ICD-10-CM | POA: Diagnosis not present

## 2020-04-28 DIAGNOSIS — C50411 Malignant neoplasm of upper-outer quadrant of right female breast: Secondary | ICD-10-CM | POA: Diagnosis not present

## 2020-04-30 ENCOUNTER — Ambulatory Visit
Admission: RE | Admit: 2020-04-30 | Discharge: 2020-04-30 | Disposition: A | Discharge status: Home / Self Care | Payer: Medicaid Other | Source: Ambulatory Visit | Attending: Radiation Oncology | Admitting: Radiation Oncology

## 2020-04-30 ENCOUNTER — Ambulatory Visit: Payer: Medicaid Other

## 2020-04-30 ENCOUNTER — Other Ambulatory Visit: Payer: Self-pay

## 2020-04-30 DIAGNOSIS — Z170 Estrogen receptor positive status [ER+]: Secondary | ICD-10-CM | POA: Diagnosis not present

## 2020-04-30 DIAGNOSIS — C50411 Malignant neoplasm of upper-outer quadrant of right female breast: Secondary | ICD-10-CM | POA: Diagnosis not present

## 2020-05-01 ENCOUNTER — Ambulatory Visit: Payer: Medicaid Other

## 2020-05-01 ENCOUNTER — Ambulatory Visit
Admission: RE | Admit: 2020-05-01 | Discharge: 2020-05-01 | Disposition: A | Discharge status: Home / Self Care | Payer: Medicaid Other | Source: Ambulatory Visit | Attending: Radiation Oncology | Admitting: Radiation Oncology

## 2020-05-01 ENCOUNTER — Other Ambulatory Visit: Payer: Self-pay

## 2020-05-01 DIAGNOSIS — Z170 Estrogen receptor positive status [ER+]: Secondary | ICD-10-CM | POA: Diagnosis not present

## 2020-05-01 DIAGNOSIS — C50411 Malignant neoplasm of upper-outer quadrant of right female breast: Secondary | ICD-10-CM | POA: Diagnosis not present

## 2020-05-02 ENCOUNTER — Encounter: Payer: Self-pay | Admitting: Rehabilitation

## 2020-05-02 ENCOUNTER — Other Ambulatory Visit: Payer: Self-pay | Admitting: *Deleted

## 2020-05-02 ENCOUNTER — Ambulatory Visit
Admission: RE | Admit: 2020-05-02 | Discharge: 2020-05-02 | Disposition: A | Discharge status: Home / Self Care | Payer: Medicaid Other | Source: Ambulatory Visit | Attending: Radiation Oncology | Admitting: Radiation Oncology

## 2020-05-02 DIAGNOSIS — Z170 Estrogen receptor positive status [ER+]: Secondary | ICD-10-CM | POA: Diagnosis not present

## 2020-05-02 DIAGNOSIS — C50411 Malignant neoplasm of upper-outer quadrant of right female breast: Secondary | ICD-10-CM | POA: Diagnosis not present

## 2020-05-02 NOTE — Patient Outreach (Signed)
Care Coordination  05/02/2020  Alice [name] 1976-02-12 [id number]   An unsuccessful telephone outreach was attempted today. The patient was referred to the case management team for assistance with care management and care coordination. Call was made using interpreter # [phone] through [facility].  Follow Up Plan: A HIPAA compliant phone message was left for the patient providing contact information and requesting a return call.  The Managed Medicaid care management team will reach out to the patient again over the next 7-14 days.   Lurena Joiner RN, BSN [place]  [employer] manager

## 2020-05-02 NOTE — Patient Instructions (Signed)
Visit Information  Ms. Adali Pennings  - as a part of your Medicaid benefit, you are eligible for care management and care coordination services at no cost or copay. I was unable to reach you by phone today but would be happy to help you with your health related needs. Please feel free to call me @ [phone].   A member of the Managed Medicaid care management team will reach out to you again over the next 7-14 days.   Lurena Joiner RN, BSN [place]  [employer] manager

## 2020-05-03 ENCOUNTER — Ambulatory Visit
Admission: RE | Admit: 2020-05-03 | Discharge: 2020-05-03 | Disposition: A | Discharge status: Home / Self Care | Payer: Medicaid Other | Source: Ambulatory Visit | Attending: Radiation Oncology | Admitting: Radiation Oncology

## 2020-05-03 DIAGNOSIS — Z170 Estrogen receptor positive status [ER+]: Secondary | ICD-10-CM | POA: Diagnosis not present

## 2020-05-03 DIAGNOSIS — C50411 Malignant neoplasm of upper-outer quadrant of right female breast: Secondary | ICD-10-CM | POA: Diagnosis not present

## 2020-05-08 ENCOUNTER — Ambulatory Visit
Admission: RE | Admit: 2020-05-08 | Discharge: 2020-05-08 | Disposition: A | Discharge status: Home / Self Care | Payer: Medicaid Other | Source: Ambulatory Visit | Attending: Radiation Oncology | Admitting: Radiation Oncology

## 2020-05-08 DIAGNOSIS — Z170 Estrogen receptor positive status [ER+]: Secondary | ICD-10-CM | POA: Diagnosis not present

## 2020-05-08 DIAGNOSIS — C50411 Malignant neoplasm of upper-outer quadrant of right female breast: Secondary | ICD-10-CM | POA: Diagnosis not present

## 2020-05-09 ENCOUNTER — Ambulatory Visit
Admission: RE | Admit: 2020-05-09 | Discharge: 2020-05-09 | Disposition: A | Discharge status: Home / Self Care | Payer: Medicaid Other | Source: Ambulatory Visit | Attending: Radiation Oncology | Admitting: Radiation Oncology

## 2020-05-09 ENCOUNTER — Encounter: Payer: Self-pay | Admitting: Rehabilitation

## 2020-05-09 DIAGNOSIS — Z170 Estrogen receptor positive status [ER+]: Secondary | ICD-10-CM | POA: Diagnosis not present

## 2020-05-09 DIAGNOSIS — C50411 Malignant neoplasm of upper-outer quadrant of right female breast: Secondary | ICD-10-CM | POA: Diagnosis not present

## 2020-05-10 ENCOUNTER — Ambulatory Visit
Admission: RE | Admit: 2020-05-10 | Discharge: 2020-05-10 | Disposition: A | Discharge status: Home / Self Care | Payer: Medicaid Other | Source: Ambulatory Visit | Attending: Radiation Oncology | Admitting: Radiation Oncology

## 2020-05-10 DIAGNOSIS — Z170 Estrogen receptor positive status [ER+]: Secondary | ICD-10-CM | POA: Insufficient documentation

## 2020-05-10 DIAGNOSIS — Z419 Encounter for procedure for purposes other than remedying health state, unspecified: Secondary | ICD-10-CM | POA: Diagnosis not present

## 2020-05-10 DIAGNOSIS — C50411 Malignant neoplasm of upper-outer quadrant of right female breast: Secondary | ICD-10-CM | POA: Diagnosis not present

## 2020-05-11 ENCOUNTER — Ambulatory Visit
Admission: RE | Admit: 2020-05-11 | Discharge: 2020-05-11 | Disposition: A | Discharge status: Home / Self Care | Payer: Medicaid Other | Source: Ambulatory Visit | Attending: Radiation Oncology | Admitting: Radiation Oncology

## 2020-05-11 ENCOUNTER — Ambulatory Visit: Payer: Medicaid Other | Admitting: Physical Therapy

## 2020-05-11 DIAGNOSIS — Z170 Estrogen receptor positive status [ER+]: Secondary | ICD-10-CM | POA: Diagnosis not present

## 2020-05-11 DIAGNOSIS — C50411 Malignant neoplasm of upper-outer quadrant of right female breast: Secondary | ICD-10-CM | POA: Diagnosis not present

## 2020-05-12 ENCOUNTER — Ambulatory Visit
Admission: RE | Admit: 2020-05-12 | Discharge: 2020-05-12 | Disposition: A | Discharge status: Home / Self Care | Payer: Medicaid Other | Source: Ambulatory Visit | Attending: Radiation Oncology | Admitting: Radiation Oncology

## 2020-05-12 ENCOUNTER — Ambulatory Visit: Payer: Medicaid Other | Admitting: Radiation Oncology

## 2020-05-12 DIAGNOSIS — Z170 Estrogen receptor positive status [ER+]: Secondary | ICD-10-CM | POA: Diagnosis not present

## 2020-05-12 DIAGNOSIS — C50411 Malignant neoplasm of upper-outer quadrant of right female breast: Secondary | ICD-10-CM | POA: Diagnosis not present

## 2020-05-15 ENCOUNTER — Other Ambulatory Visit: Payer: Self-pay

## 2020-05-15 ENCOUNTER — Ambulatory Visit
Admission: RE | Admit: 2020-05-15 | Discharge: 2020-05-15 | Disposition: A | Discharge status: Home / Self Care | Payer: Medicaid Other | Source: Ambulatory Visit | Attending: Radiation Oncology | Admitting: Radiation Oncology

## 2020-05-15 ENCOUNTER — Inpatient Hospital Stay: Payer: Medicaid Other | Attending: Oncology

## 2020-05-15 ENCOUNTER — Inpatient Hospital Stay: Payer: Medicaid Other

## 2020-05-15 ENCOUNTER — Inpatient Hospital Stay (HOSPITAL_BASED_OUTPATIENT_CLINIC_OR_DEPARTMENT_OTHER): Payer: Medicaid Other | Admitting: Medical

## 2020-05-15 ENCOUNTER — Other Ambulatory Visit: Payer: Self-pay | Admitting: Medical

## 2020-05-15 VITALS — BP 143/92 | HR 88 | Temp 98.5°F | Resp 17

## 2020-05-15 DIAGNOSIS — Z79899 Other long term (current) drug therapy: Secondary | ICD-10-CM | POA: Diagnosis not present

## 2020-05-15 DIAGNOSIS — Z170 Estrogen receptor positive status [ER+]: Secondary | ICD-10-CM

## 2020-05-15 DIAGNOSIS — C50411 Malignant neoplasm of upper-outer quadrant of right female breast: Secondary | ICD-10-CM

## 2020-05-15 DIAGNOSIS — C773 Secondary and unspecified malignant neoplasm of axilla and upper limb lymph nodes: Secondary | ICD-10-CM | POA: Diagnosis not present

## 2020-05-15 DIAGNOSIS — R21 Rash and other nonspecific skin eruption: Secondary | ICD-10-CM

## 2020-05-15 DIAGNOSIS — Z95828 Presence of other vascular implants and grafts: Secondary | ICD-10-CM

## 2020-05-15 DIAGNOSIS — Z5112 Encounter for antineoplastic immunotherapy: Secondary | ICD-10-CM | POA: Diagnosis not present

## 2020-05-15 LAB — CBC WITH DIFFERENTIAL/PLATELET
Abs Immature Granulocytes: 0.01 10*3/uL (ref 0.00–0.07)
Basophils Absolute: 0 10*3/uL (ref 0.0–0.1)
Basophils Relative: 0 %
Eosinophils Absolute: 0 10*3/uL (ref 0.0–0.5)
Eosinophils Relative: 1 %
HCT: 34.9 % — ABNORMAL LOW (ref 36.0–46.0)
Hemoglobin: 11.5 g/dL — ABNORMAL LOW (ref 12.0–15.0)
Immature Granulocytes: 0 %
Lymphocytes Relative: 17 %
Lymphs Abs: 0.8 10*3/uL (ref 0.7–4.0)
MCH: 30.3 pg (ref 26.0–34.0)
MCHC: 33 g/dL (ref 30.0–36.0)
MCV: 91.8 fL (ref 80.0–100.0)
Monocytes Absolute: 0.4 10*3/uL (ref 0.1–1.0)
Monocytes Relative: 9 %
Neutro Abs: 3.3 10*3/uL (ref 1.7–7.7)
Neutrophils Relative %: 73 %
Platelets: 242 10*3/uL (ref 150–400)
RBC: 3.8 MIL/uL — ABNORMAL LOW (ref 3.87–5.11)
RDW: 11.6 % (ref 11.5–15.5)
WBC: 4.5 10*3/uL (ref 4.0–10.5)
nRBC: 0 % (ref 0.0–0.2)

## 2020-05-15 LAB — COMPREHENSIVE METABOLIC PANEL
ALT: 37 U/L (ref 0–44)
AST: 24 U/L (ref 15–41)
Albumin: 3.7 g/dL (ref 3.5–5.0)
Alkaline Phosphatase: 95 U/L (ref 38–126)
Anion gap: 9 (ref 5–15)
BUN: 8 mg/dL (ref 6–20)
CO2: 26 mmol/L (ref 22–32)
Calcium: 9.6 mg/dL (ref 8.9–10.3)
Chloride: 104 mmol/L (ref 98–111)
Creatinine, Ser: 0.76 mg/dL (ref 0.44–1.00)
GFR, Estimated: >60 mL/min (ref >60)
Glucose, Bld: 101 mg/dL — ABNORMAL HIGH (ref 70–99)
Potassium: 3.9 mmol/L (ref 3.5–5.1)
Sodium: 139 mmol/L (ref 135–145)
Total Bilirubin: 0.5 mg/dL (ref 0.3–1.2)
Total Protein: 7.3 g/dL (ref 6.5–8.1)

## 2020-05-15 LAB — PREGNANCY, URINE: Preg Test, Ur: NEGATIVE

## 2020-05-15 MED ORDER — SODIUM CHLORIDE 0.9 % IV SOLN
420.0000 mg | Freq: Once | INTRAVENOUS | Status: AC
  Administered 2020-05-15: 420 mg via INTRAVENOUS
  Filled 2020-05-15: qty 14

## 2020-05-15 MED ORDER — TRIAMCINOLONE ACETONIDE 0.1 % EX LOTN: 1.0000 "application " | TOPICAL_LOTION | Freq: Four times a day (QID) | CUTANEOUS | 2 refills | Status: DC

## 2020-05-15 MED ORDER — HEPARIN SOD (PORK) LOCK FLUSH 100 UNIT/ML IV SOLN
500.0000 [IU] | Freq: Once | INTRAVENOUS | Status: AC | PRN
  Administered 2020-05-15: 500 [IU]
  Filled 2020-05-15: qty 5

## 2020-05-15 MED ORDER — DIPHENHYDRAMINE HCL 25 MG PO CAPS
50.0000 mg | ORAL_CAPSULE | Freq: Once | ORAL | Status: AC
  Administered 2020-05-15: 50 mg via ORAL

## 2020-05-15 MED ORDER — SODIUM CHLORIDE 0.9 % IV SOLN
Freq: Once | INTRAVENOUS | Status: AC
  Filled 2020-05-15: qty 250

## 2020-05-15 MED ORDER — ACETAMINOPHEN 325 MG PO TABS
ORAL_TABLET | ORAL | Status: AC
  Filled 2020-05-15: qty 2

## 2020-05-15 MED ORDER — ACETAMINOPHEN 325 MG PO TABS
650.0000 mg | ORAL_TABLET | Freq: Once | ORAL | Status: AC
  Administered 2020-05-15: 650 mg via ORAL

## 2020-05-15 MED ORDER — DIPHENHYDRAMINE HCL 25 MG PO CAPS
ORAL_CAPSULE | ORAL | Status: AC
  Filled 2020-05-15: qty 2

## 2020-05-15 MED ORDER — SODIUM CHLORIDE 0.9% FLUSH
10.0000 mL | INTRAVENOUS | Status: DC | PRN
  Administered 2020-05-15: 10 mL
  Filled 2020-05-15: qty 10

## 2020-05-15 MED ORDER — TRASTUZUMAB-DKST CHEMO 150 MG IV SOLR
6.0000 mg/kg | Freq: Once | INTRAVENOUS | Status: AC
  Administered 2020-05-15: 525 mg via INTRAVENOUS
  Filled 2020-05-15: qty 25

## 2020-05-15 NOTE — Progress Notes (Signed)
No blood return noted from port a cath up on discharge. Flushing well Dr. Jana Hakim notified. OK to discharge pt. No intervention ordered at this time.

## 2020-05-15 NOTE — Progress Notes (Signed)
Pt reports that for about 2 weeks she has been having a rash. She takes Benadryl at home for the rash almost nightly. Dr. Virgie Dad nurse notified. Dr. Jana Hakim asked for V. Tanner to see her while in she is in the infusion room. Rx sent to pharmacy

## 2020-05-15 NOTE — Progress Notes (Signed)
This patient was seen in the infusion room today with the assistance of an online interpreter.  She receives Herceptin and Perjeta and is receiving radiation therapy.  She has noted a rash over the past 2 to 3 weeks that occurs over various places on her body and is itching.  She reports that the rash occurs mainly at night.  She does take Benadryl with temporary relief lasting around 15 minutes.  She denies fevers, chills, sweats, or other issues of concern.  A prescription for triamcinolone lotion was sent to her pharmacy with instructions for the patient to use this as needed.  Sandi Mealy, MHS, PA-C Physician Assistant

## 2020-05-15 NOTE — Patient Instructions (Signed)
[facility] Discharge Instructions for Patients Receiving Chemotherapy  Today you received the following chemotherapy agents Trastuzumab-dkst (OGIVRI) & Pertuzumab (PERJETA).  To help prevent nausea and vomiting after your treatment, we encourage you to take your nausea medication as prescribed.   If you develop nausea and vomiting that is not controlled by your nausea medication, call the clinic.   BELOW ARE SYMPTOMS THAT SHOULD BE REPORTED IMMEDIATELY:  *FEVER GREATER THAN 100.5 F  *CHILLS WITH OR WITHOUT FEVER  NAUSEA AND VOMITING THAT IS NOT CONTROLLED WITH YOUR NAUSEA MEDICATION  *UNUSUAL SHORTNESS OF BREATH  *UNUSUAL BRUISING OR BLEEDING  TENDERNESS IN MOUTH AND THROAT WITH OR WITHOUT PRESENCE OF ULCERS  *URINARY PROBLEMS  *BOWEL PROBLEMS  UNUSUAL RASH Items with * indicate a potential emergency and should be followed up as soon as possible.  Feel free to call the clinic should you have any questions or concerns. The clinic phone number is [phone].  Please show the CHEMO ALERT CARD at check-in to the Emergency Department and triage nurse.   

## 2020-05-16 ENCOUNTER — Ambulatory Visit
Admission: RE | Admit: 2020-05-16 | Discharge: 2020-05-16 | Disposition: A | Discharge status: Home / Self Care | Payer: Medicaid Other | Source: Ambulatory Visit | Attending: Radiation Oncology | Admitting: Radiation Oncology

## 2020-05-16 DIAGNOSIS — C50411 Malignant neoplasm of upper-outer quadrant of right female breast: Secondary | ICD-10-CM | POA: Diagnosis not present

## 2020-05-16 DIAGNOSIS — Z170 Estrogen receptor positive status [ER+]: Secondary | ICD-10-CM | POA: Diagnosis not present

## 2020-05-17 ENCOUNTER — Other Ambulatory Visit: Payer: Self-pay

## 2020-05-17 ENCOUNTER — Ambulatory Visit
Admission: RE | Admit: 2020-05-17 | Discharge: 2020-05-17 | Disposition: A | Discharge status: Home / Self Care | Payer: Medicaid Other | Source: Ambulatory Visit | Attending: Radiation Oncology | Admitting: Radiation Oncology

## 2020-05-17 DIAGNOSIS — C50411 Malignant neoplasm of upper-outer quadrant of right female breast: Secondary | ICD-10-CM | POA: Diagnosis not present

## 2020-05-17 DIAGNOSIS — Z170 Estrogen receptor positive status [ER+]: Secondary | ICD-10-CM | POA: Diagnosis not present

## 2020-05-18 ENCOUNTER — Ambulatory Visit
Admission: RE | Admit: 2020-05-18 | Discharge: 2020-05-18 | Disposition: A | Discharge status: Home / Self Care | Payer: Medicaid Other | Source: Ambulatory Visit | Attending: Radiation Oncology | Admitting: Radiation Oncology

## 2020-05-18 DIAGNOSIS — Z170 Estrogen receptor positive status [ER+]: Secondary | ICD-10-CM | POA: Diagnosis not present

## 2020-05-18 DIAGNOSIS — C50411 Malignant neoplasm of upper-outer quadrant of right female breast: Secondary | ICD-10-CM | POA: Diagnosis not present

## 2020-05-19 ENCOUNTER — Ambulatory Visit
Admission: RE | Admit: 2020-05-19 | Discharge: 2020-05-19 | Disposition: A | Discharge status: Home / Self Care | Payer: Medicaid Other | Source: Ambulatory Visit | Attending: Radiation Oncology | Admitting: Radiation Oncology

## 2020-05-19 DIAGNOSIS — Z170 Estrogen receptor positive status [ER+]: Secondary | ICD-10-CM | POA: Diagnosis not present

## 2020-05-19 DIAGNOSIS — C50411 Malignant neoplasm of upper-outer quadrant of right female breast: Secondary | ICD-10-CM | POA: Diagnosis not present

## 2020-05-22 ENCOUNTER — Ambulatory Visit
Admission: RE | Admit: 2020-05-22 | Discharge: 2020-05-22 | Disposition: A | Discharge status: Home / Self Care | Payer: Medicaid Other | Source: Ambulatory Visit | Attending: Radiation Oncology | Admitting: Radiation Oncology

## 2020-05-22 ENCOUNTER — Other Ambulatory Visit: Payer: Self-pay | Admitting: *Deleted

## 2020-05-22 DIAGNOSIS — C50411 Malignant neoplasm of upper-outer quadrant of right female breast: Secondary | ICD-10-CM | POA: Diagnosis not present

## 2020-05-22 DIAGNOSIS — Z170 Estrogen receptor positive status [ER+]: Secondary | ICD-10-CM | POA: Diagnosis not present

## 2020-05-23 ENCOUNTER — Ambulatory Visit
Admission: RE | Admit: 2020-05-23 | Discharge: 2020-05-23 | Disposition: A | Discharge status: Home / Self Care | Payer: Medicaid Other | Source: Ambulatory Visit | Attending: Radiation Oncology | Admitting: Radiation Oncology

## 2020-05-23 DIAGNOSIS — C50411 Malignant neoplasm of upper-outer quadrant of right female breast: Secondary | ICD-10-CM | POA: Diagnosis not present

## 2020-05-23 DIAGNOSIS — Z170 Estrogen receptor positive status [ER+]: Secondary | ICD-10-CM | POA: Diagnosis not present

## 2020-05-24 ENCOUNTER — Ambulatory Visit
Admission: RE | Admit: 2020-05-24 | Discharge: 2020-05-24 | Disposition: A | Discharge status: Home / Self Care | Payer: Medicaid Other | Source: Ambulatory Visit | Attending: Radiation Oncology | Admitting: Radiation Oncology

## 2020-05-24 DIAGNOSIS — Z170 Estrogen receptor positive status [ER+]: Secondary | ICD-10-CM | POA: Diagnosis not present

## 2020-05-24 DIAGNOSIS — C50411 Malignant neoplasm of upper-outer quadrant of right female breast: Secondary | ICD-10-CM | POA: Diagnosis not present

## 2020-05-25 ENCOUNTER — Other Ambulatory Visit: Payer: Self-pay

## 2020-05-25 ENCOUNTER — Ambulatory Visit
Admission: RE | Admit: 2020-05-25 | Discharge: 2020-05-25 | Disposition: A | Discharge status: Home / Self Care | Payer: Medicaid Other | Source: Ambulatory Visit | Attending: Radiation Oncology | Admitting: Radiation Oncology

## 2020-05-25 DIAGNOSIS — C50411 Malignant neoplasm of upper-outer quadrant of right female breast: Secondary | ICD-10-CM | POA: Diagnosis not present

## 2020-05-25 DIAGNOSIS — Z170 Estrogen receptor positive status [ER+]: Secondary | ICD-10-CM | POA: Diagnosis not present

## 2020-05-26 ENCOUNTER — Ambulatory Visit
Admission: RE | Admit: 2020-05-26 | Discharge: 2020-05-26 | Disposition: A | Discharge status: Home / Self Care | Payer: Medicaid Other | Source: Ambulatory Visit | Attending: Radiation Oncology | Admitting: Radiation Oncology

## 2020-05-26 ENCOUNTER — Other Ambulatory Visit: Payer: Self-pay

## 2020-05-26 DIAGNOSIS — Z170 Estrogen receptor positive status [ER+]: Secondary | ICD-10-CM | POA: Diagnosis not present

## 2020-05-26 DIAGNOSIS — C50411 Malignant neoplasm of upper-outer quadrant of right female breast: Secondary | ICD-10-CM | POA: Diagnosis not present

## 2020-05-29 ENCOUNTER — Ambulatory Visit
Admission: RE | Admit: 2020-05-29 | Discharge: 2020-05-29 | Disposition: A | Discharge status: Home / Self Care | Payer: Medicaid Other | Source: Ambulatory Visit | Attending: Radiation Oncology | Admitting: Radiation Oncology

## 2020-05-29 ENCOUNTER — Encounter: Payer: Self-pay | Admitting: *Deleted

## 2020-05-29 DIAGNOSIS — C50411 Malignant neoplasm of upper-outer quadrant of right female breast: Secondary | ICD-10-CM | POA: Diagnosis not present

## 2020-05-29 DIAGNOSIS — Z170 Estrogen receptor positive status [ER+]: Secondary | ICD-10-CM | POA: Diagnosis not present

## 2020-05-30 ENCOUNTER — Ambulatory Visit
Admission: RE | Admit: 2020-05-30 | Discharge: 2020-05-30 | Disposition: A | Discharge status: Home / Self Care | Payer: Medicaid Other | Source: Ambulatory Visit | Attending: Radiation Oncology | Admitting: Radiation Oncology

## 2020-05-30 ENCOUNTER — Encounter: Payer: Self-pay | Admitting: Radiation Oncology

## 2020-05-30 DIAGNOSIS — C50411 Malignant neoplasm of upper-outer quadrant of right female breast: Secondary | ICD-10-CM | POA: Diagnosis not present

## 2020-05-30 DIAGNOSIS — Z170 Estrogen receptor positive status [ER+]: Secondary | ICD-10-CM | POA: Diagnosis not present

## 2020-05-31 ENCOUNTER — Ambulatory Visit: Payer: Medicaid Other

## 2020-06-05 ENCOUNTER — Other Ambulatory Visit: Payer: Medicaid Other

## 2020-06-05 ENCOUNTER — Ambulatory Visit: Payer: Medicaid Other | Admitting: Medical

## 2020-06-05 ENCOUNTER — Ambulatory Visit: Payer: Medicaid Other

## 2020-06-10 DIAGNOSIS — Z419 Encounter for procedure for purposes other than remedying health state, unspecified: Secondary | ICD-10-CM | POA: Diagnosis not present

## 2020-06-16 ENCOUNTER — Other Ambulatory Visit: Payer: Medicaid Other

## 2020-06-16 ENCOUNTER — Ambulatory Visit: Payer: Medicaid Other

## 2020-06-22 ENCOUNTER — Inpatient Hospital Stay: Payer: Medicaid Other

## 2020-06-22 ENCOUNTER — Inpatient Hospital Stay: Payer: Medicaid Other | Attending: Oncology

## 2020-06-22 ENCOUNTER — Inpatient Hospital Stay: Payer: Medicaid Other | Admitting: Adult Health

## 2020-06-22 ENCOUNTER — Telehealth: Payer: Self-pay | Admitting: *Deleted

## 2020-06-22 DIAGNOSIS — Z171 Estrogen receptor negative status [ER-]: Secondary | ICD-10-CM | POA: Insufficient documentation

## 2020-06-22 DIAGNOSIS — C773 Secondary and unspecified malignant neoplasm of axilla and upper limb lymph nodes: Secondary | ICD-10-CM | POA: Insufficient documentation

## 2020-06-22 DIAGNOSIS — Z79899 Other long term (current) drug therapy: Secondary | ICD-10-CM | POA: Insufficient documentation

## 2020-06-22 DIAGNOSIS — Z5112 Encounter for antineoplastic immunotherapy: Secondary | ICD-10-CM | POA: Insufficient documentation

## 2020-06-22 DIAGNOSIS — C50411 Malignant neoplasm of upper-outer quadrant of right female breast: Secondary | ICD-10-CM | POA: Insufficient documentation

## 2020-06-22 NOTE — Telephone Encounter (Signed)
Informed pt is currently not here for scheduled appts for lab/visit and treatment.  Noted pt was rescheduled from last week ( unsure why ) - this RN attempted to call pt - This RN attempted to return call to pt with no answer. General return call to nurse message left with this RN's name.

## 2020-06-26 ENCOUNTER — Other Ambulatory Visit: Payer: Medicaid Other

## 2020-06-26 ENCOUNTER — Ambulatory Visit: Payer: Medicaid Other | Admitting: Oncology

## 2020-06-28 ENCOUNTER — Telehealth: Payer: Self-pay | Admitting: Radiation Oncology

## 2020-06-28 NOTE — Telephone Encounter (Signed)
  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  Interval Since Last Radiation:  5 weeks   04/13/20-05/30/20: The right chest wall and regional nodes were treated to 50.4 Gy in 28 fractions, followed by a 10 Gy boost in 5 fractions.   Narrative:  The patient was contacted today with the help of pacific interpretor service for routine follow-up. During treatment she did very well with radiotherapy and did not have significant desquamation. She reports she is doing well and only has minimal discoloration at present.  Impression/Plan: 1. 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 The patient has been doing well since completion of radiotherapy. We discussed that we would be happy to continue to follow her as needed, but she will also continue to follow up with Dr. Jana Hakim in medical oncology. She was counseled on skin care as well as measures to avoid sun exposure to this area.     Carola Rhine, PAC

## 2020-06-29 ENCOUNTER — Inpatient Hospital Stay: Payer: Medicaid Other

## 2020-06-29 ENCOUNTER — Other Ambulatory Visit: Payer: Self-pay | Admitting: Oncology

## 2020-06-29 ENCOUNTER — Other Ambulatory Visit: Payer: Self-pay

## 2020-06-29 ENCOUNTER — Inpatient Hospital Stay (HOSPITAL_BASED_OUTPATIENT_CLINIC_OR_DEPARTMENT_OTHER): Payer: Medicaid Other | Admitting: Adult Health

## 2020-06-29 ENCOUNTER — Encounter: Payer: Self-pay | Admitting: Adult Health

## 2020-06-29 VITALS — BP 123/89 | HR 93 | Temp 97.8°F | Resp 17 | Ht 62.0 in | Wt 199.3 lb

## 2020-06-29 DIAGNOSIS — C50411 Malignant neoplasm of upper-outer quadrant of right female breast: Secondary | ICD-10-CM

## 2020-06-29 DIAGNOSIS — Z79899 Other long term (current) drug therapy: Secondary | ICD-10-CM | POA: Diagnosis not present

## 2020-06-29 DIAGNOSIS — C773 Secondary and unspecified malignant neoplasm of axilla and upper limb lymph nodes: Secondary | ICD-10-CM | POA: Diagnosis not present

## 2020-06-29 DIAGNOSIS — Z170 Estrogen receptor positive status [ER+]: Secondary | ICD-10-CM | POA: Diagnosis not present

## 2020-06-29 DIAGNOSIS — Z5112 Encounter for antineoplastic immunotherapy: Secondary | ICD-10-CM | POA: Diagnosis not present

## 2020-06-29 DIAGNOSIS — Z171 Estrogen receptor negative status [ER-]: Secondary | ICD-10-CM | POA: Diagnosis not present

## 2020-06-29 LAB — CBC WITH DIFFERENTIAL/PLATELET
Abs Immature Granulocytes: 0.02 10*3/uL (ref 0.00–0.07)
Basophils Absolute: 0 10*3/uL (ref 0.0–0.1)
Basophils Relative: 0 %
Eosinophils Absolute: 0.1 10*3/uL (ref 0.0–0.5)
Eosinophils Relative: 1 %
HCT: 36.1 % (ref 36.0–46.0)
Hemoglobin: 11.6 g/dL — ABNORMAL LOW (ref 12.0–15.0)
Immature Granulocytes: 0 %
Lymphocytes Relative: 23 %
Lymphs Abs: 1.4 10*3/uL (ref 0.7–4.0)
MCH: 30.1 pg (ref 26.0–34.0)
MCHC: 32.1 g/dL (ref 30.0–36.0)
MCV: 93.8 fL (ref 80.0–100.0)
Monocytes Absolute: 0.5 10*3/uL (ref 0.1–1.0)
Monocytes Relative: 8 %
Neutro Abs: 4 10*3/uL (ref 1.7–7.7)
Neutrophils Relative %: 68 %
Platelets: 280 10*3/uL (ref 150–400)
RBC: 3.85 MIL/uL — ABNORMAL LOW (ref 3.87–5.11)
RDW: 13.1 % (ref 11.5–15.5)
WBC: 5.9 10*3/uL (ref 4.0–10.5)
nRBC: 0 % (ref 0.0–0.2)

## 2020-06-29 LAB — COMPREHENSIVE METABOLIC PANEL
ALT: 35 U/L (ref 0–44)
AST: 40 U/L (ref 15–41)
Albumin: 3.9 g/dL (ref 3.5–5.0)
Alkaline Phosphatase: 135 U/L — ABNORMAL HIGH (ref 38–126)
Anion gap: 11 (ref 5–15)
BUN: 11 mg/dL (ref 6–20)
CO2: 27 mmol/L (ref 22–32)
Calcium: 9.9 mg/dL (ref 8.9–10.3)
Chloride: 102 mmol/L (ref 98–111)
Creatinine, Ser: 0.85 mg/dL (ref 0.44–1.00)
GFR, Estimated: >60 mL/min (ref >60)
Glucose, Bld: 114 mg/dL — ABNORMAL HIGH (ref 70–99)
Potassium: 4.3 mmol/L (ref 3.5–5.1)
Sodium: 140 mmol/L (ref 135–145)
Total Bilirubin: 0.8 mg/dL (ref 0.3–1.2)
Total Protein: 8.1 g/dL (ref 6.5–8.1)

## 2020-06-29 LAB — PREGNANCY, URINE: Preg Test, Ur: NEGATIVE

## 2020-06-29 MED ORDER — SODIUM CHLORIDE 0.9 % IV SOLN
Freq: Once | INTRAVENOUS | Status: AC
  Filled 2020-06-29: qty 250

## 2020-06-29 MED ORDER — LIDOCAINE-PRILOCAINE 2.5-2.5 % EX CREA: 1.0000 "application " | TOPICAL_CREAM | CUTANEOUS | 0 refills | Status: DC | PRN

## 2020-06-29 MED ORDER — ACETAMINOPHEN 325 MG PO TABS
650.0000 mg | ORAL_TABLET | Freq: Once | ORAL | Status: AC
  Administered 2020-06-29: 650 mg via ORAL

## 2020-06-29 MED ORDER — HEPARIN SOD (PORK) LOCK FLUSH 100 UNIT/ML IV SOLN
500.0000 [IU] | Freq: Once | INTRAVENOUS | Status: AC | PRN
  Administered 2020-06-29: 500 [IU]
  Filled 2020-06-29: qty 5

## 2020-06-29 MED ORDER — SODIUM CHLORIDE 0.9 % IV SOLN
840.0000 mg | Freq: Once | INTRAVENOUS | Status: AC
  Administered 2020-06-29: 840 mg via INTRAVENOUS
  Filled 2020-06-29: qty 28

## 2020-06-29 MED ORDER — TRASTUZUMAB-DKST CHEMO 150 MG IV SOLR
750.0000 mg | Freq: Once | INTRAVENOUS | Status: AC
  Administered 2020-06-29: 750 mg via INTRAVENOUS
  Filled 2020-06-29: qty 35.72

## 2020-06-29 MED ORDER — DIPHENHYDRAMINE HCL 25 MG PO CAPS
50.0000 mg | ORAL_CAPSULE | Freq: Once | ORAL | Status: AC
  Administered 2020-06-29: 50 mg via ORAL

## 2020-06-29 MED ORDER — ACETAMINOPHEN 325 MG PO TABS
ORAL_TABLET | ORAL | Status: AC
  Filled 2020-06-29: qty 2

## 2020-06-29 MED ORDER — DIPHENHYDRAMINE HCL 25 MG PO CAPS
ORAL_CAPSULE | ORAL | Status: AC
  Filled 2020-06-29: qty 2

## 2020-06-29 MED ORDER — SODIUM CHLORIDE 0.9% FLUSH
10.0000 mL | INTRAVENOUS | Status: DC | PRN
  Administered 2020-06-29: 10 mL
  Filled 2020-06-29: qty 10

## 2020-06-29 NOTE — Progress Notes (Signed)
[facility]  [phone] [phone]     ID: Alice [name] DOB: [birth year]  MR#: [id number]  NOI#:[id number]  Patient Care Team: Patient, No Pcp Per as PCP - General (General Practice) Pershing Proud, RN as Oncology Nurse Navigator Donnelly Angelica, RN as Oncology Nurse Navigator Ovidio Kin, MD as Consulting Physician (General Surgery) Magrinat, Valentino Hue, MD as Consulting Physician (Oncology) Dorothy Puffer, MD as Consulting Physician (Radiation Oncology) Laurey Morale, MD as Consulting Physician (Cardiology) Verlon Au, MD (Family Medicine) Noreene Filbert, NP OTHER MD:  CHIEF COMPLAINT: estrogen receptor negative, 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  She is accompanied by an interpreter Alice [name].    She continues on maintenance trastuzumab and pertuzumab, with a dose due today. Her most recent echocardiogram was 04/25/2020, with an ejection fraction in the 60-65%  She hasn't received Herceptin Perjeta since 05/15/2021.  She notes that she was traveling and that is why she missed her last couple of appointment.    REVIEW OF SYSTEMS: Alice [name] is doing quite well.  Her skin is recovering from radiation therapy, and she is still applying the cream.  She denies any new issues such as fever, chills, cough, shortness of breath, chest pain, palpitations, bowel/bladder changes, skin rash or any other concerns. A detailed ROS was otherwise non contributory.      COVID 19 VACCINATION STATUS: As of 04/24/2020 not yet vaccinated   HISTORY OF CURRENT ILLNESS: From the original intake note:  Alice [name] presented with right breast pain with swelling and skin changes (duskiness and dimpling) and left breast pain at 6 o'clock. She  underwent bilateral diagnostic mammography with tomography and bilateral breast ultrasonography at [facility] on 09/23/2019 showing: breast density category C; 5.2 cm irregular mass in right breast spanning 10-12 o'clock, with marked peau d'orange changes concerning for inflammatory component; 3.3 cm enlarged lymph node in right axilla; indeterminate 1 cm oval mass in left breast at 5 o'clock.  Accordingly on 09/30/2019 she proceeded to biopsy of the right breast area in question. The pathology from this procedure ([id number]) showed: invasive mammary carcinoma, grade 3, e-cadherin positive. Prognostic indicators significant for: estrogen receptor, 0% negative and progesterone receptor, 0% negative. Proliferation marker Ki67 at 80%. HER2 positive by immunohistochemistry (3+).  The biopsied right axillary lymph node was positive for metastatic carcinoma.  She also underwent cyst aspiration of the 1 cm mass in the left breast the same day.  The patient's subsequent history is as detailed below.   PAST MEDICAL HISTORY: Past Medical History:  Diagnosis Date  . Anemia   . Breast cancer ([facility])    Right  . Hypertension     PAST SURGICAL HISTORY: Past Surgical History:  Procedure Laterality Date  . BREAST BIOPSY    . CHOLECYSTECTOMY    . IR IMAGING GUIDED PORT INSERTION  10/13/2019  . MASTECTOMY MODIFIED RADICAL Right 03/06/2020   Procedure: MASTECTOMY MODIFIED RADICAL;  Surgeon: Ovidio Kin, MD;  Location: [facility];  Service: General;  Laterality: Right;  RNFA    FAMILY HISTORY: History reviewed. No pertinent family history.  As of 09/2019-- her father is 17 and her mother is 36. She has two brothers and one sister. There is no cancer in her family to her knowledge.    GYNECOLOGIC HISTORY:  No LMP recorded. Menarche: 45 years  old Age at first live birth: 45 years old GX P 2 LMP regular periods lasting 3 days; periods stopped after the first chemotherapy dose May 2021 Contraceptive: previously  used, has not used for the last 2 years HRT n/a  Hysterectomy? no BSO? no   SOCIAL HISTORY: (updated 09/2019)  Alice [name] is originally from [place]  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 Husband Alice [name] is currently unemployed but is a good Training and development officer. She lives at home with Alice [name] and their two children. Son Alice [name], age 62, has special needs.  Son Alice [name], age 18, is in high school.    ADVANCED DIRECTIVES: In the absence of any documentation to the contrary, the patient's spouse is their HCPOA.    HEALTH MAINTENANCE: Social History   Tobacco Use  . Smoking status: Never Smoker  . Smokeless tobacco: Never Used  Vaping Use  . Vaping Use: Never used  Substance Use Topics  . Alcohol use: Never  . Drug use: Never     Colonoscopy: n/a (age)  PAP: 2020  Bone density: n/a (age)   No Known Allergies  Current Outpatient Medications  Medication Sig Dispense Refill  . lidocaine-prilocaine (EMLA) cream Apply 1 application topically as needed. 30 g 0  . lisinopril (ZESTRIL) 10 MG tablet Take 10 mg by mouth daily.     No current facility-administered medications for this visit.    OBJECTIVE:   Vitals:   06/29/20 0941  BP: 123/89  Pulse: 93  Resp: 17  Temp: 97.8 F (36.6 C)  SpO2: 98%   Wt Readings from Last 3 Encounters:  06/29/20 199 lb 4.8 oz (90.4 kg)  04/24/20 201 lb 4.8 oz (91.3 kg)  04/03/20 200 lb 8 oz (90.9 kg)   Body mass index is 36.45 kg/m.    ECOG FS:1 - Symptomatic but completely ambulatory  GENERAL: Patient is a well appearing female in no acute distress HEENT:  Sclerae anicteric.Mask in place. Neck is supple.  NODES:  No cervical, supraclavicular, or axillary lymphadenopathy palpated.  BREAST EXAM:  Right breast s/p mastectomy and radiation.  Inspected only, radiation changes are present and healing well, no sign of infection or recurrence. LUNGS:  Clear to auscultation bilaterally.  No wheezes or rhonchi. HEART:  Regular  rate and rhythm. No murmur appreciated. ABDOMEN:  Soft, nontender.  Positive, normoactive bowel sounds. No organomegaly palpated. MSK:  No focal spinal tenderness to palpation. Full range of motion bilaterally in the upper extremities. EXTREMITIES:  No peripheral edema.   SKIN:  Clear with no obvious rashes or skin changes. No nail dyscrasia. NEURO:  Nonfocal. Well oriented.  Appropriate affect.     LAB RESULTS:  CMP     Component Value Date/Time   NA 140 06/29/2020 0900   K 4.3 06/29/2020 0900   CL 102 06/29/2020 0900   CO2 27 06/29/2020 0900   GLUCOSE 114 (H) 06/29/2020 0900   BUN 11 06/29/2020 0900   CREATININE 0.85 06/29/2020 0900   CREATININE 0.79 10/06/2019 0826   CALCIUM 9.9 06/29/2020 0900   PROT 8.1 06/29/2020 0900   ALBUMIN 3.9 06/29/2020 0900   AST 40 06/29/2020 0900   AST 26 10/06/2019 0826   ALT 35 06/29/2020 0900   ALT 47 (H) 10/06/2019 0826   ALKPHOS 135 (H) 06/29/2020 0900   BILITOT 0.8 06/29/2020 0900   BILITOT 0.7 10/06/2019 0826   GFRNONAA >60 06/29/2020 0900   GFRNONAA >60 10/06/2019 0826   GFRAA >60 03/13/2020 1142  GFRAA >60 10/06/2019 0826    No results found for: TOTALPROTELP, ALBUMINELP, A1GS, A2GS, BETS, BETA2SER, GAMS, MSPIKE, SPEI  Lab Results  Component Value Date   WBC 5.9 06/29/2020   NEUTROABS 4.0 06/29/2020   HGB 11.6 (L) 06/29/2020   HCT 36.1 06/29/2020   MCV 93.8 06/29/2020   PLT 280 06/29/2020    No results found for: LABCA2  No components found for: [id number]  No results for input(s): INR in the last 168 hours.  No results found for: LABCA2  No results found for: PEA835  No results found for: YVD732  No results found for: QVO720  No results found for: CA2729  No components found for: HGQUANT  No results found for: CEA1 / No results found for: CEA1   No results found for: AFPTUMOR  No results found for: CHROMOGRNA  No results found for: KPAFRELGTCHN, LAMBDASER, KAPLAMBRATIO (kappa/lambda light  chains)  No results found for: HGBA, HGBA2QUANT, HGBFQUANT, HGBSQUAN (Hemoglobinopathy evaluation)   No results found for: LDH  No results found for: IRON, TIBC, IRONPCTSAT (Iron and TIBC)  No results found for: FERRITIN  Urinalysis No results found for: COLORURINE, APPEARANCEUR, LABSPEC, PHURINE, GLUCOSEU, HGBUR, BILIRUBINUR, KETONESUR, PROTEINUR, UROBILINOGEN, NITRITE, LEUKOCYTESUR   STUDIES: No results found.   ELIGIBLE FOR AVAILABLE RESEARCH PROTOCOL: no  ASSESSMENT: 45 y.o. [place] Spanish speaker status post right breast upper outer quadrant sites biopsy and right axillary lymph node biopsy 09/30/2019 for a clinical T3 N2, stage IIIA invasive ductal carcinoma, grade 3, estrogen and progesterone receptor negative, HER-2 amplified, with an MIB-1 of 80%.  (a) CT chest abdomen and pelvis and bone scan 10/15/2019 showed no evidence of metastatic disease  (1) genetics testing 10/13/2019 through the Common Hereditary Cancers Panel offered by [facility] found no deleterious mutations in APC, ATM, AXIN2, BARD1, BMPR1A, BRCA1, BRCA2, BRIP1, CDH1, CDKN2A (p14ARF), CDKN2A (p16INK4a), CKD4, CHEK2, CTNNA1, DICER1, EPCAM (Deletion/duplication testing only), GREM1 (promoter region deletion/duplication testing only), KIT, MEN1, MLH1, MSH2, MSH3, MSH6, MUTYH, NBN, NF1, NHTL1, PALB2, PDGFRA, PMS2, POLD1, POLE, PTEN, RAD50, RAD51C, RAD51D, RNF43, SDHB, SDHC, SDHD, SMAD4, SMARCA4. STK11, TP53, TSC1, TSC2, and VHL.  The following genes were evaluated for sequence changes only: SDHA and HOXB13 c.251G>A variant only.  (2) neoadjuvant chemotherapy consisting of trastuzumab, pertuzumab, docetaxel and carboplatin every 21 days x 6 started 10/19/2019, completed 02/01/2020  (3) trastuzumab and pertuzumab to be continued to total 1 year (through May 2022).  (a) echo 10/15/2019 shows an ejection fraction in the 65-70% range.  (b) echo 04/25/2020  (4) status post right modified radical mastectomy on 03/06/2020  showing no residual cancer in the breast or lymph nodes (ypT0 ypN0)  (a) a total of 7 right axillary lymph nodes were removed  (5) adjuvant radiation 04/13/20-05/30/20:  The right chest wall and regional nodes were treated to 50.4 Gy in 28 fractions, followed by a 10 Gy boost in 5 fractions.    PLAN: Alena is here today for f/u of her HER-2 positive breast cancer.  She has no clinical signs of breast cancer recurrence.  She completed radiation therapy and her skin is healing well.  I recommended that she continue to apply her alra cream to the area.    She and I discussed the recommendation of her receiving Trastuzumab/Pertuzumab every 3 weeks through May, 2022.  She has missed 2 appointments.  We discussed that her risk of cancer recurrence increases if she doesn't return for her treatments as recommended.  She understands this.  I will set her  up with the remainder of her treatments today so that she can prepare.  She denies any needs at this time such as transportation, cost, child care, etc, that we can help her with.    K[place]ia will receive Trastuzumab/Pertuzumab today.  I refilled her Emla cream, and she will return in 3 weeks to receive her next treatment.  Her echocardiogram was ordered for February as well.    She knows to call for any questions that may arise between now and her next appointment.  We are happy to see her sooner if needed.  Total encounter time: 30 minutes*   Wilber Bihari, NP 06/29/20 9:57 AM Medical Oncology and Hematology [facility] [place], [place] [address] Tel. [phone]    Fax. [phone]   *Total Encounter Time as defined by the Centers for Medicare and Medicaid Services includes, in addition to the face-to-face time of a patient visit (documented in the note above) non-face-to-face time: obtaining and reviewing outside history, ordering and reviewing medications, tests or procedures, care coordination (communications with  other health care professionals or caregivers) and documentation in the medical record.

## 2020-06-29 NOTE — Addendum Note (Signed)
Addended by: Scot Dock on: 06/29/2020 10:44 AM   Modules accepted: Orders

## 2020-06-29 NOTE — Patient Instructions (Addendum)
[facility] Discharge Instructions for Patients Receiving Chemotherapy  Today you received the following chemotherapy agents Trastuzumab-dkst ([facility]) & Pertuzumab (PERJETA).  To help prevent nausea and vomiting after your treatment, we encourage you to take your nausea medication as prescribed.   If you develop nausea and vomiting that is not controlled by your nausea medication, call the clinic.   BELOW ARE SYMPTOMS THAT SHOULD BE REPORTED IMMEDIATELY:  *FEVER GREATER THAN 100.5 F  *CHILLS WITH OR WITHOUT FEVER  NAUSEA AND VOMITING THAT IS NOT CONTROLLED WITH YOUR NAUSEA MEDICATION  *UNUSUAL SHORTNESS OF BREATH  *UNUSUAL BRUISING OR BLEEDING  TENDERNESS IN MOUTH AND THROAT WITH OR WITHOUT PRESENCE OF ULCERS  *URINARY PROBLEMS  *BOWEL PROBLEMS  UNUSUAL RASH Items with * indicate a potential emergency and should be followed up as soon as possible.  Feel free to call the clinic should you have any questions or concerns. The clinic phone number is [phone].  Please show the CHEMO ALERT CARD at check-in to the Emergency Department and triage nurse.  Trastuzumab; Hyaluronidase injection Qu es este medicamento? TRASTUZUMAB; HIALURONIDASA se usa para tratar el cncer de mama y el cncer de estmago. El trastuzumab es un anticuerpo monoclonal. La hialuronidasa se usa para mejorar los efectos del trastuzumab. Este medicamento puede ser utilizado para otros usos; si tiene alguna pregunta consulte con su proveedor de atencin mdica o con su farmacutico. MARCAS COMUNES: HERCEPTIN HYLECTA Qu le debo informar a mi profesional de la salud antes de tomar este medicamento? Necesitan saber si usted presenta alguno de los siguientes problemas o situaciones: enfermedad cardiaca insuficiencia cardiaca enfermedad pulmonar o respiratoria, como asma una reaccin alrgica o inusual al trastuzumab, a otros medicamentos, alimentos, colorantes o conservantes si est embarazada  o buscando quedar embarazada si est amamantando a un beb Cmo debo utilizar este medicamento? Este medicamento se administra mediante una inyeccin por va subcutnea. Lo administra un profesional de la salud en un hospital o en un entorno clnico. Hable con su pediatra para informarse acerca del uso de este medicamento en nios. Este medicamento no est aprobado para uso en nios. Sobredosis: Pngase en contacto inmediatamente con un centro toxicolgico o una sala de urgencia si usted cree que haya tomado demasiado medicamento. ATENCIN: Este medicamento es solo para usted. No comparta este medicamento con nadie. Qu sucede si me olvido de una dosis? Es importante no olvidar ninguna dosis. Informe a su mdico o a su profesional de la salud si no puede asistir a una cita. Qu puede interactuar con este medicamento? Este medicamento podra interactuar con los siguientes frmacos: ciertos tipos de quimioterapia, tales como daunorubicina, doxorrubicina, epirubicina, e idarubicina Puede ser que esta lista no menciona todas las posibles interacciones. Informe a su profesional de la salud de todos los productos a base de hierbas, medicamentos de venta libre o suplementos nutritivos que est tomando. Si usted fuma, consume bebidas alcohlicas o si utiliza drogas ilegales, indqueselo tambin a su profesional de la salud. Algunas sustancias pueden interactuar con su medicamento. A qu debo estar atento al usar este medicamento? Visite a su mdico para que revise su evolucin. Si presenta algn efecto secundario, infrmelo. Contine con el tratamiento aun si se siente enfermo, a menos que su mdico le indique que lo suspenda. Consulte a su mdico o a su profesional de la salud si tiene fiebre, escalofros o dolor de garganta, o cualquier otro sntoma de resfro o gripe. No se trate usted mismo. Trate de no acercarse a personas que   estn enfermas. Es posible que tenga fiebre, escalofros y temblores  durante su primera infusin. Estos efectos generalmente son leves y pueden tratarse con otros medicamentos. Informe cualquier efecto secundario durante la infusin a su profesional de la salud. La fiebre y los escalofros por lo general no suceden con las infusiones posteriores. No debe quedar embarazada mientras est usando este medicamento o por 7 meses despus de dejar de usarlo. Las mujeres deben informar a su mdico si estn buscando quedar embarazadas o si creen que podran estar embarazadas. Las mujeres con la posibilidad de tener nios deben tener una prueba de embarazo negativa antes de empezar a tomar este medicamento. Existe la posibilidad de efectos secundarios graves en un beb sin nacer. Para obtener ms informacin, hable con su profesional de la salud o su farmacutico. No debe amamantar a un beb mientras est tomando este medicamento o por 7 meses despus de dejar de usarlo. Qu efectos secundarios puedo tener al utilizar este medicamento? Efectos secundarios que debe informar a su mdico o a su profesional de la salud tan pronto como sea posible: reacciones alrgicas, como erupcin cutnea, comezn/picazn o urticaria, e hinchazn de la cara, los labios o la lengua problemas respiratorios dolor en el pecho o palpitaciones tos fiebre sensacin general de estar enfermo o sntomas gripales signos de empeoramiento de la insuficiencia cardiaca, tales como problemas respiratorios; hinchazn en las piernas y los pies Efectos secundarios que generalmente no requieren atencin mdica (debe informarlos a su mdico o a su profesional de la salud si persisten o si son molestos): dolor de huesos cambios en el sentido del gusto diarrea dolor en las articulaciones nuseas, vmito cansancio o debilidad inusual prdida de peso Puede ser que esta lista no menciona todos los posibles efectos secundarios. Comunquese a su mdico por asesoramiento mdico sobre los efectos secundarios. Usted puede informar los  efectos secundarios a la FDA por telfono al 1-800-FDA-1088. Dnde debo guardar mi medicina? Este medicamento se administra en hospitales o clnicas, y no necesitar guardarlo en su domicilio. ATENCIN: Este folleto es un resumen. Puede ser que no cubra toda la posible informacin. Si usted tiene preguntas acerca de esta medicina, consulte con su mdico, su farmacutico o su profesional de la salud.  2021 Elsevier[employer] Standard (2019-11-30 00:00:00)  Pertuzumab injection Qu es este medicamento? El PERTUZUMAB es un anticuerpo monoclonal. Se utiliza para tratar el cncer de mama. Este medicamento puede ser utilizado para otros usos; si tiene alguna pregunta consulte con su proveedor de atencin mdica o con su farmacutico. MARCAS COMUNES: PERJETA Qu le debo informar a mi profesional de la salud antes de tomar este medicamento? Necesita saber si usted presenta alguno de los siguientes problemas o situaciones: enfermedad cardiaca insuficiencia cardiaca alta presin sangunea antecedentes de pulso cardiaca irregular radioterapia reciente o continuada una reaccin alrgica o inusual al pertuzumab, a otros medicamentos, alimentos, colorantes o conservantes si est embarazada o buscando quedar embarazada si est amamantando a un beb Cmo debo utilizar este medicamento? Este medicamento se administra mediante infusin por va intravenosa. Lo administra un profesional de la salud en un hospital o en un entorno clnico. Hable con su pediatra para informarse acerca del uso de este medicamento en nios. Puede requerir atencin especial. Sobredosis: Pngase en contacto inmediatamente con un centro toxicolgico o una sala de urgencia si usted cree que haya tomado demasiado medicamento. ATENCIN: Este medicamento es solo para usted. No comparta este medicamento con nadie. Qu sucede si me olvido de una dosis? Es importante no olvidar ninguna   dosis. Informe a su mdico o a su profesional de la salud si  no puede asistir a una cita. Qu puede interactuar con este medicamento? No se esperan interacciones. Puede ser que esta lista no menciona todas las posibles interacciones. Informe a su profesional de la salud de todos los productos a base de hierbas, medicamentos de venta libre o suplementos nutritivos que est tomando. Si usted fuma, consume bebidas alcohlicas o si utiliza drogas ilegales, indqueselo tambin a su profesional de la salud. Algunas sustancias pueden interactuar con su medicamento. A qu debo estar atento al usar este medicamento? Se supervisar su estado de salud atentamente mientras reciba este medicamento. Si presenta algn efecto secundario, infrmelo. Sin embargo, contine con el tratamiento aun si se siente enfermo, a menos que su mdico le indique que lo suspenda. No debe quedar embarazada mientras est tomando este medicamento o por 7 meses despus de dejar de usarlo. Las mujeres deben informar a su mdico si estn buscando quedar embarazadas o si creen que estn embarazadas. Las mujeres con la posibilidad de tener nios deben tener una prueba de embarazo negativa antes de empezar a tomar este medicamento. Existe la posibilidad de que ocurran efectos secundarios graves a un beb sin nacer. Para ms informacin hable con su profesional de la salud o su farmacutico. No debe amamantar a un beb mientras est tomando este medicamento o por 7 meses despus de dejar de usarlo. Las mujeres deben usar un mtodo anticonceptivo eficaz con este medicamento. Consulte a su mdico o a su profesional de la salud si tiene fiebre, escalofros, dolor de garganta, o cualquier otro sntoma de resfro o gripe. No se trate usted mismo. Trate de no acercarse a personas que estn enfermas. Es posible que tenga fiebre, escalofros y dolor de cabeza durante la infusin. Informe cualquier efecto secundario durante la infusin a su profesional de la salud. Qu efectos secundarios puedo tener al utilizar  este medicamento? Efectos secundarios que debe informar a su mdico o a su profesional de la salud tan pronto como sea posible: problemas respiratorios dolor en el pecho o palpitaciones mareos sensacin de desmayos o aturdimiento fiebre o escalofros erupcin cutnea, picazn o urticaria dolor de garganta hinchazn de la cara, labios o lengua hinchazn de piernas o tobillos cansancio o debilidad inusual Efectos secundarios que generalmente no requieren atencin mdica (infrmelos a su mdico o a su profesional de la salud si persisten o si son molestos): diarrea cada del cabello nuseas, vmito cansancio Puede ser que esta lista no menciona todos los posibles efectos secundarios. Comunquese a su mdico por asesoramiento mdico sobre los efectos secundarios. Usted puede informar los efectos secundarios a la FDA por telfono al 1-800-FDA-1088. Dnde debo guardar mi medicina? Este medicamento se administra en hospitales o clnicas y no necesitar guardarlo en su domicilio. ATENCIN: Este folleto es un resumen. Puede ser que no cubra toda la posible informacin. Si usted tiene preguntas acerca de esta medicina, consulte con su mdico, su farmacutico o su profesional de la salud.  2021 Elsevier/Gold Standard (2016-06-27 00:00:00)  

## 2020-07-03 ENCOUNTER — Ambulatory Visit: Payer: Medicaid Other | Attending: Family Medicine | Admitting: Physical Therapy

## 2020-07-04 NOTE — Progress Notes (Signed)
  Radiation Oncology         [phone] ________________________________  Name: Alice [name] MRN: [id number]  Date: 05/30/2020  DOB: 06/02/1976  End of Treatment Note  Diagnosis:   right-sided breast cancer     Indication for treatment:  Curative       Radiation treatment dates:   04/13/20 - 05/30/20  Site/dose:   The patient initially received a dose of 50.4 Gy in 28 fractions to the chest wall and supraclavicular region. This was delivered using a 3-D conformal, 4 field technique. The patient then received a boost to the mastectomy scar. This delivered an additional 10 Gy in 5 fractions using an en face electron field. The total dose was 60.4 Gy.   Narrative: The patient tolerated radiation treatment relatively well.   The patient had some expected skin irritation as she progressed during treatment.    Plan: The patient has completed radiation treatment. The patient will return to radiation oncology clinic for routine followup in one month. I advised the patient to call or return sooner if they have any questions or concerns related to their recovery or treatment. ________________________________  Jodelle Gross, M.D., Ph.D.

## 2020-07-05 ENCOUNTER — Ambulatory Visit: Payer: Self-pay | Admitting: Physical Therapy

## 2020-07-07 ENCOUNTER — Other Ambulatory Visit: Payer: Medicaid Other

## 2020-07-07 ENCOUNTER — Ambulatory Visit: Payer: Medicaid Other

## 2020-07-11 DIAGNOSIS — Z419 Encounter for procedure for purposes other than remedying health state, unspecified: Secondary | ICD-10-CM | POA: Diagnosis not present

## 2020-07-12 ENCOUNTER — Encounter (HOSPITAL_COMMUNITY): Payer: Self-pay

## 2020-07-13 ENCOUNTER — Other Ambulatory Visit: Payer: Medicaid Other

## 2020-07-13 ENCOUNTER — Ambulatory Visit: Payer: Medicaid Other

## 2020-07-14 ENCOUNTER — Encounter: Payer: Self-pay | Admitting: *Deleted

## 2020-07-14 ENCOUNTER — Telehealth: Payer: Self-pay | Admitting: Oncology

## 2020-07-14 NOTE — Telephone Encounter (Signed)
Scheduled appt per 2/3 sch msg - pt is aware of appt date and time   

## 2020-07-17 ENCOUNTER — Other Ambulatory Visit: Payer: Self-pay

## 2020-07-17 ENCOUNTER — Ambulatory Visit (HOSPITAL_COMMUNITY)
Admission: RE | Admit: 2020-07-17 | Discharge: 2020-07-17 | Disposition: A | Discharge status: Home / Self Care | Payer: Medicaid Other | Source: Ambulatory Visit | Attending: Adult Health | Admitting: Adult Health

## 2020-07-17 DIAGNOSIS — D649 Anemia, unspecified: Secondary | ICD-10-CM | POA: Diagnosis not present

## 2020-07-17 DIAGNOSIS — Z170 Estrogen receptor positive status [ER+]: Secondary | ICD-10-CM | POA: Insufficient documentation

## 2020-07-17 DIAGNOSIS — I10 Essential (primary) hypertension: Secondary | ICD-10-CM | POA: Diagnosis not present

## 2020-07-17 DIAGNOSIS — C50411 Malignant neoplasm of upper-outer quadrant of right female breast: Secondary | ICD-10-CM | POA: Insufficient documentation

## 2020-07-17 NOTE — Progress Notes (Signed)
  Echocardiogram 2D Echocardiogram has been performed.  Darlina Sicilian M 07/17/2020, 10:44 AM

## 2020-07-20 ENCOUNTER — Encounter: Payer: Self-pay | Admitting: *Deleted

## 2020-07-20 ENCOUNTER — Inpatient Hospital Stay: Payer: Medicaid Other | Attending: Oncology

## 2020-07-20 ENCOUNTER — Inpatient Hospital Stay: Payer: Medicaid Other

## 2020-07-20 ENCOUNTER — Other Ambulatory Visit: Payer: Self-pay

## 2020-07-20 VITALS — BP 108/87 | HR 86 | Temp 98.1°F | Resp 18 | Wt 201.5 lb

## 2020-07-20 DIAGNOSIS — Z5112 Encounter for antineoplastic immunotherapy: Secondary | ICD-10-CM | POA: Insufficient documentation

## 2020-07-20 DIAGNOSIS — Z171 Estrogen receptor negative status [ER-]: Secondary | ICD-10-CM | POA: Insufficient documentation

## 2020-07-20 DIAGNOSIS — C773 Secondary and unspecified malignant neoplasm of axilla and upper limb lymph nodes: Secondary | ICD-10-CM | POA: Insufficient documentation

## 2020-07-20 DIAGNOSIS — Z95828 Presence of other vascular implants and grafts: Secondary | ICD-10-CM

## 2020-07-20 DIAGNOSIS — Z79899 Other long term (current) drug therapy: Secondary | ICD-10-CM | POA: Insufficient documentation

## 2020-07-20 DIAGNOSIS — Z170 Estrogen receptor positive status [ER+]: Secondary | ICD-10-CM

## 2020-07-20 DIAGNOSIS — C50411 Malignant neoplasm of upper-outer quadrant of right female breast: Secondary | ICD-10-CM

## 2020-07-20 DIAGNOSIS — C50811 Malignant neoplasm of overlapping sites of right female breast: Secondary | ICD-10-CM | POA: Insufficient documentation

## 2020-07-20 LAB — COMPREHENSIVE METABOLIC PANEL
ALT: 36 U/L (ref 0–44)
AST: 30 U/L (ref 15–41)
Albumin: 3.6 g/dL (ref 3.5–5.0)
Alkaline Phosphatase: 134 U/L — ABNORMAL HIGH (ref 38–126)
Anion gap: 8 (ref 5–15)
BUN: 15 mg/dL (ref 6–20)
CO2: 25 mmol/L (ref 22–32)
Calcium: 9.1 mg/dL (ref 8.9–10.3)
Chloride: 103 mmol/L (ref 98–111)
Creatinine, Ser: 0.76 mg/dL (ref 0.44–1.00)
GFR, Estimated: >60 mL/min (ref >60)
Glucose, Bld: 107 mg/dL — ABNORMAL HIGH (ref 70–99)
Potassium: 3.6 mmol/L (ref 3.5–5.1)
Sodium: 136 mmol/L (ref 135–145)
Total Bilirubin: 0.5 mg/dL (ref 0.3–1.2)
Total Protein: 7.2 g/dL (ref 6.5–8.1)

## 2020-07-20 LAB — CBC WITH DIFFERENTIAL/PLATELET
Abs Immature Granulocytes: 0.01 10*3/uL (ref 0.00–0.07)
Basophils Absolute: 0 10*3/uL (ref 0.0–0.1)
Basophils Relative: 0 %
Eosinophils Absolute: 0.1 10*3/uL (ref 0.0–0.5)
Eosinophils Relative: 2 %
HCT: 32.4 % — ABNORMAL LOW (ref 36.0–46.0)
Hemoglobin: 10.4 g/dL — ABNORMAL LOW (ref 12.0–15.0)
Immature Granulocytes: 0 %
Lymphocytes Relative: 27 %
Lymphs Abs: 1.4 10*3/uL (ref 0.7–4.0)
MCH: 30.1 pg (ref 26.0–34.0)
MCHC: 32.1 g/dL (ref 30.0–36.0)
MCV: 93.6 fL (ref 80.0–100.0)
Monocytes Absolute: 0.4 10*3/uL (ref 0.1–1.0)
Monocytes Relative: 8 %
Neutro Abs: 3.4 10*3/uL (ref 1.7–7.7)
Neutrophils Relative %: 63 %
Platelets: 296 10*3/uL (ref 150–400)
RBC: 3.46 MIL/uL — ABNORMAL LOW (ref 3.87–5.11)
RDW: 13.2 % (ref 11.5–15.5)
WBC: 5.4 10*3/uL (ref 4.0–10.5)
nRBC: 0 % (ref 0.0–0.2)

## 2020-07-20 LAB — PREGNANCY, URINE: Preg Test, Ur: NEGATIVE

## 2020-07-20 LAB — ECHOCARDIOGRAM COMPLETE
Area-P 1/2: 3.27 cm2
S' Lateral: 2.17 cm

## 2020-07-20 MED ORDER — HEPARIN SOD (PORK) LOCK FLUSH 100 UNIT/ML IV SOLN
500.0000 [IU] | Freq: Once | INTRAVENOUS | Status: AC | PRN
  Administered 2020-07-20: 500 [IU]
  Filled 2020-07-20: qty 5

## 2020-07-20 MED ORDER — TRASTUZUMAB-DKST CHEMO 150 MG IV SOLR
6.0000 mg/kg | Freq: Once | INTRAVENOUS | Status: AC
  Administered 2020-07-20: 525 mg via INTRAVENOUS
  Filled 2020-07-20: qty 25

## 2020-07-20 MED ORDER — DIPHENHYDRAMINE HCL 25 MG PO CAPS
50.0000 mg | ORAL_CAPSULE | Freq: Once | ORAL | Status: AC
  Administered 2020-07-20: 50 mg via ORAL

## 2020-07-20 MED ORDER — SODIUM CHLORIDE 0.9% FLUSH
10.0000 mL | INTRAVENOUS | Status: DC | PRN
  Administered 2020-07-20: 10 mL
  Filled 2020-07-20: qty 10

## 2020-07-20 MED ORDER — ACETAMINOPHEN 325 MG PO TABS
650.0000 mg | ORAL_TABLET | Freq: Once | ORAL | Status: AC
  Administered 2020-07-20: 650 mg via ORAL

## 2020-07-20 MED ORDER — DIPHENHYDRAMINE HCL 25 MG PO CAPS
ORAL_CAPSULE | ORAL | Status: AC
  Filled 2020-07-20: qty 2

## 2020-07-20 MED ORDER — SODIUM CHLORIDE 0.9 % IV SOLN
Freq: Once | INTRAVENOUS | Status: AC
  Filled 2020-07-20: qty 250

## 2020-07-20 MED ORDER — SODIUM CHLORIDE 0.9 % IV SOLN
420.0000 mg | Freq: Once | INTRAVENOUS | Status: AC
  Administered 2020-07-20: 420 mg via INTRAVENOUS
  Filled 2020-07-20: qty 14

## 2020-07-20 MED ORDER — ACETAMINOPHEN 325 MG PO TABS
ORAL_TABLET | ORAL | Status: AC
  Filled 2020-07-20: qty 2

## 2020-07-20 NOTE — Patient Instructions (Signed)
[facility] Discharge Instructions for Patients Receiving Chemotherapy  Today you received the following chemotherapy agents Trastuzumab-dkst ([facility]) & Pertuzumab (PERJETA).  To help prevent nausea and vomiting after your treatment, we encourage you to take your nausea medication as prescribed.   If you develop nausea and vomiting that is not controlled by your nausea medication, call the clinic.   BELOW ARE SYMPTOMS THAT SHOULD BE REPORTED IMMEDIATELY:  *FEVER GREATER THAN 100.5 F  *CHILLS WITH OR WITHOUT FEVER  NAUSEA AND VOMITING THAT IS NOT CONTROLLED WITH YOUR NAUSEA MEDICATION  *UNUSUAL SHORTNESS OF BREATH  *UNUSUAL BRUISING OR BLEEDING  TENDERNESS IN MOUTH AND THROAT WITH OR WITHOUT PRESENCE OF ULCERS  *URINARY PROBLEMS  *BOWEL PROBLEMS  UNUSUAL RASH Items with * indicate a potential emergency and should be followed up as soon as possible.  Feel free to call the clinic should you have any questions or concerns. The clinic phone number is [phone].  Please show the [place] at check-in to the Emergency Department and triage nurse.

## 2020-08-08 DIAGNOSIS — Z419 Encounter for procedure for purposes other than remedying health state, unspecified: Secondary | ICD-10-CM | POA: Diagnosis not present

## 2020-08-09 NOTE — Progress Notes (Signed)
[facility]  [phone] Fax:([phone]) [phone]     ID: Alice [name] DOB: 03-Apr-1976  MR#: [id number]  EUM#:[id number]  Patient Care Team: Patient, No Pcp Per as PCP - General (General Practice) Alice Kaufmann, RN as Oncology Nurse Navigator Alice Germany, RN as Oncology Nurse Navigator Alice Overall, MD as Consulting Physician (General Surgery) [name], Alice Dad, MD as Consulting Physician (Oncology) Alice Rudd, MD as Consulting Physician (Radiation Oncology) Alice Dresser, MD as Consulting Physician (Cardiology) Alice Flirt, MD (Family Medicine) Alice Cruel, MD OTHER MD:  CHIEF COMPLAINT: estrogen receptor negative, 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  She is accompanied by an interpreter Alice [name].    She continues on maintenance trastuzumab and pertuzumab, with a dose due today.   Since her last visit, she underwent repeat echocardiogram on 07/17/2020 showing an ejection fraction of >75%.   REVIEW OF SYSTEMS: Alice [name]   is doing generally well.  She tells me she is not able to work because of discomfort in her right arm particularly the medial upper right arm but she was not granted disability.  She would like to have reconstruction on the right and a reduction on the left.  A detailed review of systems today was otherwise stable   COVID 19 VACCINATION STATUS: Refuses vaccination   HISTORY OF CURRENT ILLNESS: From the original intake note:  Alice [name] presented with right breast pain with swelling and skin changes (duskiness and dimpling) and left breast pain at 6 o'clock. She underwent bilateral diagnostic mammography with tomography and bilateral breast ultrasonography at [name] on 09/23/2019 showing: breast density category C; 5.2 cm  irregular mass in right breast spanning 10-12 o'clock, with marked peau d'orange changes concerning for inflammatory component; 3.3 cm enlarged lymph node in right axilla; indeterminate 1 cm oval mass in left breast at 5 o'clock.  Accordingly on 09/30/2019 she proceeded to biopsy of the right breast area in question. The pathology from this procedure ([id number]) showed: invasive mammary carcinoma, grade 3, e-cadherin positive. Prognostic indicators significant for: estrogen receptor, 0% negative and progesterone receptor, 0% negative. Proliferation marker Ki67 at 80%. HER2 positive by immunohistochemistry (3+).  The biopsied right axillary lymph node was positive for metastatic carcinoma.  She also underwent cyst aspiration of the 1 cm mass in the left breast the same day.  The patient's subsequent history is as detailed below.   PAST MEDICAL HISTORY: Past Medical History:  Diagnosis Date   Anemia    Breast cancer ([place])    Right   Hypertension     PAST SURGICAL HISTORY: Past Surgical History:  Procedure Laterality Date   BREAST BIOPSY     CHOLECYSTECTOMY     IR IMAGING GUIDED PORT INSERTION  10/13/2019   MASTECTOMY MODIFIED RADICAL Right 03/06/2020   Procedure: MASTECTOMY MODIFIED RADICAL;  Surgeon: Alice Overall, MD;  Location: [facility];  Service: General;  Laterality: Right;  RNFA    FAMILY HISTORY: No family history on file.  As of 09/2019-- her father is 64 and her mother is 36. She has two brothers and one sister. There is no cancer in her family to her knowledge.    GYNECOLOGIC HISTORY:  No LMP recorded. Menarche: 45 years old Age at first live birth: 45 years old GX P 2 LMP regular periods lasting 3 days; periods stopped after the first  chemotherapy dose May 2021 Contraceptive: previously used, has not used for the last 2 years HRT n/a  Hysterectomy? no BSO? no   SOCIAL HISTORY: (updated March 2022))  [name]  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 Husband Alice [name] works as a good Training and development officer. She lives at home with Alice [name] and their two children. Son Alice [name], age 24, has special needs.  Son Alice [name], age 78, is in high school.    ADVANCED DIRECTIVES: In the absence of any documentation to the contrary, the patient's spouse is their HCPOA.    HEALTH MAINTENANCE: Social History   Tobacco Use   Smoking status: Never Smoker   Smokeless tobacco: Never Used  Vaping Use   Vaping Use: Never used  Substance Use Topics   Alcohol use: Never   Drug use: Never     Colonoscopy: n/a (age)  PAP: 2020  Bone density: n/a (age)   No Known Allergies  Current Outpatient Medications  Medication Sig Dispense Refill   lidocaine-prilocaine (EMLA) cream Apply 1 application topically as needed. 30 g 0   lisinopril (ZESTRIL) 10 MG tablet Take 10 mg by mouth daily.     No current facility-administered medications for this visit.    OBJECTIVE: Spanish speaker who appears stated age  45:   08/10/20 1001  BP: 109/66  Pulse: (!) 123  Resp: 18  Temp: 97.7 F (36.5 C)  SpO2: 97%   Wt Readings from Last 3 Encounters:  08/10/20 201 lb 12.8 oz (91.5 kg)  07/20/20 201 lb 8 oz (91.4 kg)  06/29/20 199 lb 4.8 oz (90.4 kg)   Body mass index is 36.91 kg/m.    ECOG FS:1 - Symptomatic but completely ambulatory   Sclerae unicteric, EOMs intact Wearing a mask No cervical or supraclavicular adenopathy Lungs no rales or rhonchi Heart regular rate and rhythm Abd soft, nontender, positive bowel sounds MSK no focal spinal tenderness, no upper extremity lymphedema Neuro: nonfocal, well oriented, appropriate affect Breasts: The right breast is status post mastectomy and radiation.  Associated with the incision towards the middle portion of the incision is a 1-1/2 inch area of induration which requires evaluation.  The left breast is benign.  Both axillae are benign.   LAB RESULTS:  CMP      Component Value Date/Time   NA 136 08/10/2020 0944   K 3.6 08/10/2020 0944   CL 103 08/10/2020 0944   CO2 23 08/10/2020 0944   GLUCOSE 108 (H) 08/10/2020 0944   BUN 13 08/10/2020 0944   CREATININE 0.80 08/10/2020 0944   CREATININE 0.79 10/06/2019 0826   CALCIUM 9.1 08/10/2020 0944   PROT 7.3 08/10/2020 0944   ALBUMIN 3.7 08/10/2020 0944   AST 36 08/10/2020 0944   AST 26 10/06/2019 0826   ALT 41 08/10/2020 0944   ALT 47 (H) 10/06/2019 0826   ALKPHOS 125 08/10/2020 0944   BILITOT 0.7 08/10/2020 0944   BILITOT 0.7 10/06/2019 0826   GFRNONAA >60 08/10/2020 0944   GFRNONAA >60 10/06/2019 0826   GFRAA >60 03/13/2020 1142   GFRAA >60 10/06/2019 0826    No results found for: TOTALPROTELP, ALBUMINELP, A1GS, A2GS, BETS, BETA2SER, GAMS, MSPIKE, SPEI  Lab Results  Component Value Date   WBC 4.7 08/10/2020   NEUTROABS 2.9 08/10/2020   HGB 10.9 (L) 08/10/2020   HCT 33.1 (L) 08/10/2020   MCV 91.7 08/10/2020   PLT 296 08/10/2020    No results found for: LABCA2  No components found for: [id number]  No results for input(s): INR in the last 168 hours.  No results found for: LABCA2  No results found for: JKK938  No results found for: HWE993  No results found for: ZJI967  No results found for: CA2729  No components found for: HGQUANT  No results found for: CEA1 / No results found for: CEA1   No results found for: AFPTUMOR  No results found for: CHROMOGRNA  No results found for: KPAFRELGTCHN, LAMBDASER, KAPLAMBRATIO (kappa/lambda light chains)  No results found for: HGBA, HGBA2QUANT, HGBFQUANT, HGBSQUAN (Hemoglobinopathy evaluation)   No results found for: LDH  No results found for: IRON, TIBC, IRONPCTSAT (Iron and TIBC)  No results found for: FERRITIN  Urinalysis No results found for: COLORURINE, APPEARANCEUR, LABSPEC, PHURINE, GLUCOSEU, HGBUR, BILIRUBINUR, KETONESUR, PROTEINUR, UROBILINOGEN, NITRITE, LEUKOCYTESUR   STUDIES: ECHOCARDIOGRAM COMPLETE  Result  Date: 07/20/2020    ECHOCARDIOGRAM REPORT   Patient Name:   Alice [name] Date of Exam: 07/17/2020 Medical Rec #:  [id number]      Height:       62.0 in Accession #:    [id number]     Weight:       199.3 lb Date of Birth:  1975-10-11      BSA:          1.909 m Patient Age:    55 years       BP:           123/89 mmHg Patient Gender: F              HR:           85 bpm. Exam Location:  Outpatient Procedure: 2D Echo, Strain Analysis, Color Doppler and Cardiac Doppler Indications:    Chemo Z09  History:        Patient has prior history of Echocardiogram examinations, most                 recent 04/25/2020. Risk Factors:Hypertension. Breast Cancer.                 Anemia.  Referring Phys: [id number] [place]  1. LV strain recalulated by technician (images not fully captured) Global longitudinal strain recalculated at -18% This is improved from previous echo of 04/25/20.Marland Kitchen Left ventricular ejection fraction, by estimation, is >75%. The left ventricle has hyperdynamic function. The left ventricle has no regional wall motion abnormalities. Left ventricular diastolic parameters were normal.  2. Right ventricular systolic function is normal. The right ventricular size is normal.  3. The mitral valve is normal in structure. Trivial mitral valve regurgitation.  4. The aortic valve is normal in structure. Aortic valve regurgitation is not visualized. FINDINGS  Left Ventricle: LV strain recalulated by technician (images not fully captured) Global longitudinal strain recalculated at -18% This is improved from previous echo of 04/25/20. Left ventricular ejection fraction, by estimation, is >75%. The left ventricle has hyperdynamic function. The left ventricle has no regional wall motion abnormalities. The left ventricular internal cavity size was small. There is no left ventricular hypertrophy. Left ventricular diastolic parameters were normal. Right Ventricle: The right ventricular size is normal. Right  vetricular wall thickness was not assessed. Right ventricular systolic function is normal. Left Atrium: Left atrial size was normal in size. Right Atrium: Right atrial size was normal in size. Pericardium: There is no evidence of pericardial effusion. Mitral Valve: The mitral valve is normal in structure. Trivial mitral valve regurgitation. Tricuspid Valve: The tricuspid valve is  normal in structure. Tricuspid valve regurgitation is trivial. Aortic Valve: The aortic valve is normal in structure. Aortic valve regurgitation is not visualized. Pulmonic Valve: The pulmonic valve was grossly normal. Pulmonic valve regurgitation is trivial. Aorta: The aortic root and ascending aorta are structurally normal, with no evidence of dilitation. IAS/Shunts: No atrial level shunt detected by color flow Doppler.  LEFT VENTRICLE PLAX 2D LVIDd:         4.30 cm  Diastology LVIDs:         2.17 cm  LV e' medial:    6.42 cm/s LV PW:         0.90 cm  LV E/e' medial:  10.0 LV IVS:        0.80 cm  LV e' lateral:   11.20 cm/s LVOT diam:     1.50 cm  LV E/e' lateral: 5.7 LV SV:         25 LV SV Index:   13 LVOT Area:     1.77 cm  RIGHT VENTRICLE RV S prime:     10.90 cm/s TAPSE (M-mode): 1.5 cm LEFT ATRIUM             Index       RIGHT ATRIUM          Index LA diam:        2.80 cm 1.47 cm/m  RA Area:     7.27 cm LA Vol (A2C):   23.9 ml 12.52 ml/m RA Volume:   9.53 ml  4.99 ml/m LA Vol (A4C):   29.6 ml 15.51 ml/m LA Biplane Vol: 27.5 ml 14.41 ml/m  AORTIC VALVE LVOT Vmax:   71.80 cm/s LVOT Vmean:  56.600 cm/s LVOT VTI:    0.141 m  AORTA Ao Root diam: 2.80 cm Ao Asc diam:  3.00 cm MITRAL VALVE MV Area (PHT): 3.27 cm    SHUNTS MV Decel Time: 232 msec    Systemic VTI:  0.14 m MV E velocity: 64.30 cm/s  Systemic Diam: 1.50 cm MV A velocity: 67.30 cm/s MV E/A ratio:  0.96 Dietrich Pates MD Electronically signed by Dietrich Pates MD Signature Date/Time: 07/20/2020/9:26:14 AM    Final      ELIGIBLE FOR AVAILABLE RESEARCH PROTOCOL:  no  ASSESSMENT: 45 y.o. Alice [name] Spanish speaker status post right breast upper outer quadrant sites biopsy and right axillary lymph node biopsy 09/30/2019 for a clinical T3 N2, stage IIIA invasive ductal carcinoma, grade 3, estrogen and progesterone receptor negative, HER-2 amplified, with an MIB-1 of 80%.  (a) CT chest abdomen and pelvis and bone scan 10/15/2019 showed no evidence of metastatic disease  (1) genetics testing 10/13/2019 through the Common Hereditary Cancers Panel offered by [facility] found no deleterious mutations in APC, ATM, AXIN2, BARD1, BMPR1A, BRCA1, BRCA2, BRIP1, CDH1, CDKN2A (p14ARF), CDKN2A (p16INK4a), CKD4, CHEK2, CTNNA1, DICER1, EPCAM (Deletion/duplication testing only), GREM1 (promoter region deletion/duplication testing only), KIT, MEN1, MLH1, MSH2, MSH3, MSH6, MUTYH, NBN, NF1, NHTL1, PALB2, PDGFRA, PMS2, POLD1, POLE, PTEN, RAD50, RAD51C, RAD51D, RNF43, SDHB, SDHC, SDHD, SMAD4, SMARCA4. STK11, TP53, TSC1, TSC2, and VHL.  The following genes were evaluated for sequence changes only: SDHA and HOXB13 c.251G>A variant only.  (2) neoadjuvant chemotherapy consisting of trastuzumab, pertuzumab, docetaxel and carboplatin every 21 days x 6 started 10/19/2019, completed 02/01/2020  (3) trastuzumab and pertuzumab to be continued to total 1 year (through May 2022).  (a) echo 10/15/2019 shows an ejection fraction in the 65-70% range.  (b) echo 04/25/2020 shows an ejection fraction in the  60-65% range  (c) echo 07/17/2020 shows an ejection fraction in excess of 75%  (4) status post right modified radical mastectomy on 03/06/2020 showing no residual cancer in the breast or lymph nodes (ypT0 ypN0)  (a) a total of 7 right axillary lymph nodes were removed  (5) adjuvant radiation 04/13/20-05/30/20:  The right chest wall and regional nodes were treated to 50.4 Gy in 28 fractions, followed by a 10 Gy boost in 5 fractions.    PLAN: David is now half a year out from definitive surgery for  her breast cancer with no evidence of disease recurrence.  This is favorable.  She is tolerating trastuzumab well and the plan is to continue that every 3 weeks through May.  There is a mass associated with the right chest wall incision which is likely to be scar tissue but I am setting her up for an ultrasound of that area and she may well need a biopsy to prove that it is not locally recurrent disease.  She would benefit from left reduction mammoplasty and right implant reconstruction.  I have placed the plastic surgery referral to see if we can get that accomplished.  Total encounter time 25 minutes.Sarajane Jews C. Magrinat, MD 08/10/20 10:32 AM Medical Oncology and Hematology [facility] [place], [place] [address] Tel. [phone]    Fax. [phone]   I, Wilburn Mylar, am acting as scribe for Dr. Virgie Dad. [name].  I, Lurline Del MD, have reviewed the above documentation for accuracy and completeness, and I agree with the above.    *Total Encounter Time as defined by the Centers for Medicare and Medicaid Services includes, in addition to the face-to-face time of a patient visit (documented in the note above) non-face-to-face time: obtaining and reviewing outside history, ordering and reviewing medications, tests or procedures, care coordination (communications with other health care professionals or caregivers) and documentation in the medical record.

## 2020-08-10 ENCOUNTER — Inpatient Hospital Stay: Payer: Medicaid Other

## 2020-08-10 ENCOUNTER — Other Ambulatory Visit: Payer: Self-pay

## 2020-08-10 ENCOUNTER — Inpatient Hospital Stay (HOSPITAL_BASED_OUTPATIENT_CLINIC_OR_DEPARTMENT_OTHER): Payer: Medicaid Other | Admitting: Oncology

## 2020-08-10 ENCOUNTER — Inpatient Hospital Stay: Payer: Medicaid Other | Attending: Oncology

## 2020-08-10 VITALS — HR 89

## 2020-08-10 VITALS — BP 109/66 | HR 123 | Temp 97.7°F | Resp 18 | Ht 62.0 in | Wt 201.8 lb

## 2020-08-10 DIAGNOSIS — Z170 Estrogen receptor positive status [ER+]: Secondary | ICD-10-CM | POA: Diagnosis not present

## 2020-08-10 DIAGNOSIS — Z79899 Other long term (current) drug therapy: Secondary | ICD-10-CM | POA: Insufficient documentation

## 2020-08-10 DIAGNOSIS — Z95828 Presence of other vascular implants and grafts: Secondary | ICD-10-CM

## 2020-08-10 DIAGNOSIS — C773 Secondary and unspecified malignant neoplasm of axilla and upper limb lymph nodes: Secondary | ICD-10-CM | POA: Diagnosis not present

## 2020-08-10 DIAGNOSIS — Z171 Estrogen receptor negative status [ER-]: Secondary | ICD-10-CM | POA: Diagnosis not present

## 2020-08-10 DIAGNOSIS — C50411 Malignant neoplasm of upper-outer quadrant of right female breast: Secondary | ICD-10-CM | POA: Insufficient documentation

## 2020-08-10 DIAGNOSIS — Z5112 Encounter for antineoplastic immunotherapy: Secondary | ICD-10-CM | POA: Insufficient documentation

## 2020-08-10 LAB — CBC WITH DIFFERENTIAL/PLATELET
Abs Immature Granulocytes: 0.02 10*3/uL (ref 0.00–0.07)
Basophils Absolute: 0 10*3/uL (ref 0.0–0.1)
Basophils Relative: 0 %
Eosinophils Absolute: 0.1 10*3/uL (ref 0.0–0.5)
Eosinophils Relative: 2 %
HCT: 33.1 % — ABNORMAL LOW (ref 36.0–46.0)
Hemoglobin: 10.9 g/dL — ABNORMAL LOW (ref 12.0–15.0)
Immature Granulocytes: 0 %
Lymphocytes Relative: 27 %
Lymphs Abs: 1.2 10*3/uL (ref 0.7–4.0)
MCH: 30.2 pg (ref 26.0–34.0)
MCHC: 32.9 g/dL (ref 30.0–36.0)
MCV: 91.7 fL (ref 80.0–100.0)
Monocytes Absolute: 0.4 10*3/uL (ref 0.1–1.0)
Monocytes Relative: 8 %
Neutro Abs: 2.9 10*3/uL (ref 1.7–7.7)
Neutrophils Relative %: 63 %
Platelets: 296 10*3/uL (ref 150–400)
RBC: 3.61 MIL/uL — ABNORMAL LOW (ref 3.87–5.11)
RDW: 13 % (ref 11.5–15.5)
WBC: 4.7 10*3/uL (ref 4.0–10.5)
nRBC: 0 % (ref 0.0–0.2)

## 2020-08-10 LAB — COMPREHENSIVE METABOLIC PANEL
ALT: 41 U/L (ref 0–44)
AST: 36 U/L (ref 15–41)
Albumin: 3.7 g/dL (ref 3.5–5.0)
Alkaline Phosphatase: 125 U/L (ref 38–126)
Anion gap: 10 (ref 5–15)
BUN: 13 mg/dL (ref 6–20)
CO2: 23 mmol/L (ref 22–32)
Calcium: 9.1 mg/dL (ref 8.9–10.3)
Chloride: 103 mmol/L (ref 98–111)
Creatinine, Ser: 0.8 mg/dL (ref 0.44–1.00)
GFR, Estimated: >60 mL/min (ref >60)
Glucose, Bld: 108 mg/dL — ABNORMAL HIGH (ref 70–99)
Potassium: 3.6 mmol/L (ref 3.5–5.1)
Sodium: 136 mmol/L (ref 135–145)
Total Bilirubin: 0.7 mg/dL (ref 0.3–1.2)
Total Protein: 7.3 g/dL (ref 6.5–8.1)

## 2020-08-10 LAB — PREGNANCY, URINE: Preg Test, Ur: NEGATIVE

## 2020-08-10 MED ORDER — ACETAMINOPHEN 325 MG PO TABS
650.0000 mg | ORAL_TABLET | Freq: Once | ORAL | Status: AC
  Administered 2020-08-10: 650 mg via ORAL

## 2020-08-10 MED ORDER — ACETAMINOPHEN 325 MG PO TABS
ORAL_TABLET | ORAL | Status: AC
  Filled 2020-08-10: qty 2

## 2020-08-10 MED ORDER — PERTUZUMAB CHEMO INJECTION 420 MG/14ML
420.0000 mg | Freq: Once | INTRAVENOUS | Status: AC
  Administered 2020-08-10: 420 mg via INTRAVENOUS
  Filled 2020-08-10: qty 14

## 2020-08-10 MED ORDER — SODIUM CHLORIDE 0.9% FLUSH
10.0000 mL | INTRAVENOUS | Status: DC | PRN
  Administered 2020-08-10: 10 mL
  Filled 2020-08-10: qty 10

## 2020-08-10 MED ORDER — TRASTUZUMAB-DKST CHEMO 150 MG IV SOLR
6.0000 mg/kg | Freq: Once | INTRAVENOUS | Status: AC
  Administered 2020-08-10: 525 mg via INTRAVENOUS
  Filled 2020-08-10: qty 25

## 2020-08-10 MED ORDER — DIPHENHYDRAMINE HCL 25 MG PO CAPS
ORAL_CAPSULE | ORAL | Status: AC
  Filled 2020-08-10: qty 2

## 2020-08-10 MED ORDER — ZINC 220 (50 ZN) MG PO CAPS: 220.0000 mg | ORAL_CAPSULE | ORAL | 1 refills | Status: AC

## 2020-08-10 MED ORDER — DIPHENHYDRAMINE HCL 25 MG PO CAPS
50.0000 mg | ORAL_CAPSULE | Freq: Once | ORAL | Status: AC
  Administered 2020-08-10: 50 mg via ORAL

## 2020-08-10 MED ORDER — SODIUM CHLORIDE 0.9 % IV SOLN
Freq: Once | INTRAVENOUS | Status: AC
  Filled 2020-08-10: qty 250

## 2020-08-10 NOTE — Progress Notes (Signed)
Pt discharged in no apparent distress. Pt left ambulatory without assistance.  Pt aware of discharge instructions and verbalized understanding and had no further questions.   Pt waited post perjeta 30 minute wait with no issues

## 2020-08-10 NOTE — Patient Instructions (Signed)
Trastuzumab injection for infusion Qu es este medicamento? El TRASTUZUMAB es un anticuerpo monoclonal. Genene Churn para tratar el cncer de mama y de [place]. Este medicamento puede ser utilizado para otros usos; si tiene alguna pregunta consulte con su proveedor de atencin mdica o con su farmacutico. MARCAS COMUNES: Herceptin, Belenda Cruise, Ogivri, Ontruzant, Trazimera Qu le debo informar a mi profesional de la salud antes de tomar este medicamento? Necesitan saber si usted presenta alguno de los siguientes problemas o situaciones: enfermedad cardiaca insuficiencia cardiaca enfermedad pulmonar o respiratoria, como asma una reaccin alrgica o inusual al trastuzumab, al alcohol benclico, a otros medicamentos, alimentos, colorantes o conservantes si est embarazada o buscando quedar embarazada si est amamantando a un beb Cmo debo utilizar este medicamento? Este medicamento se administra mediante infusin por va intravenosa. Lo administra un profesional de la salud calificado en un hospital o en un entorno clnico. Hable con su pediatra para informarse acerca del uso de este medicamento en nios. Este medicamento no est aprobado para uso en nios. Sobredosis: Pngase en contacto inmediatamente con un centro toxicolgico o una sala de urgencia si usted cree que haya tomado demasiado medicamento. ATENCIN: [employer] es solo para usted. No comparta este medicamento con nadie. Qu sucede si me olvido de una dosis? Es importante no olvidar ninguna dosis. Informe a su mdico o a su profesional de la salud si no puede asistir a Photographer. Qu puede interactuar con este medicamento? Este medicamento podra interactuar con los siguientes frmacos: ciertos tipos de quimioterapia, tales como daunorubicina, [place], [place], e idarubicina Puede ser que esta lista no menciona todas las posibles interacciones. Informe a su profesional de [employer]	[place] de [employer] productos a base  de hierbas, medicamentos de [place] o suplementos nutritivos que est tomando. Si usted fuma, consume bebidas alcohlicas o si utiliza drogas ilegales, indqueselo tambin a su profesional de [employer]	[place]. Algunas sustancias pueden interactuar con su medicamento. A qu debo estar atento al usar [employer]? Visite a su mdico para chequear su evolucin peridicamente. Si presenta algn efecto secundario, infrmelo. Sin embargo, contine con el tratamiento aun si se siente enfermo, a menos que su mdico le indique que lo suspenda. Consulte a su mdico o a su profesional de la salud si tiene fiebre, escalofros, dolor de garganta, o cualquier otro sntoma de resfro o gripe. No se trate usted mismo. Trate de no acercarse a personas que estn enfermas. Es posible que tenga [place], escalofros y temblores durante su primera infusin. Estos efectos generalmente son leves y pueden tratarse con otros medicamentos. Informe cualquier efecto secundario durante la infusin a su profesional de [employer]	[place]. La fiebre y los escalofros por lo general no suceden con las infusiones posteriores. No debe quedar embarazada mientras est tomando este medicamento o por 7 meses despus de dejar de usarlo. Las mujeres deben informar a su mdico si estn buscando quedar embarazadas o si creen que estn embarazadas. Las mujeres con la posibilidad de Best boy nios deben tener una prueba de embarazo negativa antes de Art gallery manager a tomar este medicamento. Existe la posibilidad de que ocurran efectos secundarios graves a un beb sin nacer. Para ms informacin hable con su profesional de la salud o su farmacutico. No debe amamantar a un beb mientras est tomando este medicamento o por 7 meses despus de dejar de usarlo. Las [employer] deben usar un mtodo anticonceptivo eficaz con este medicamento. Qu efectos secundarios puedo tener al [employer] este medicamento? Efectos secundarios que debe informar a su mdico o  a su profesional de la  salud tan pronto como sea posible: reacciones alrgicas, como erupcin cutnea, comezn/picazn o urticaria, e hinchazn de la cara, los labios o la lengua dolor en el pecho o palpitaciones tos mareos sensacin de desmayos o aturdimiento, cadas fiebre sensacin general de estar enfermo o sntomas gripales signos de empeoramiento de la insuficiencia cardiaca, tales como problemas respiratorios; hinchazn en las piernas y los pies cansancio o debilidad inusual Efectos secundarios que generalmente no requieren atencin mdica (infrmelos a su mdico o a su profesional de la salud si persisten o si son molestos): dolor de huesos cambios en el sentido del gusto diarrea dolor en las articulaciones nuseas, vmito prdida de peso Puede ser que esta lista no menciona todos los posibles efectos secundarios. Comunquese a su mdico por asesoramiento mdico sobre los efectos secundarios. Usted puede informar los efectos secundarios a la FDA por telfono al 1-800-FDA-1088. Dnde debo guardar mi medicina? Este medicamento se administra en hospitales o clnicas y no necesitar guardarlo en su domicilio. ATENCIN: Este folleto es un resumen. Puede ser que no cubra toda la posible informacin. Si usted tiene preguntas acerca de esta medicina, consulte con su mdico, su farmacutico o su profesional de la salud.  2021 Elsevier/Gold Standard (2019-11-30 00:00:00)  Pertuzumab injection Qu es este medicamento? El PERTUZUMAB es un anticuerpo monoclonal. Se utiliza para tratar el cncer de mama. Este medicamento puede ser utilizado para otros usos; si tiene alguna pregunta consulte con su proveedor de atencin mdica o con su farmacutico. MARCAS COMUNES: PERJETA Qu le debo informar a mi profesional de la salud antes de tomar este medicamento? Necesita saber si usted presenta alguno de los siguientes problemas o situaciones: enfermedad cardiaca insuficiencia cardiaca alta presin sangunea antecedentes de pulso  cardiaca irregular radioterapia reciente o continuada una reaccin alrgica o inusual al pertuzumab, a otros medicamentos, alimentos, colorantes o conservantes si est embarazada o buscando quedar embarazada si est amamantando a un beb Cmo debo utilizar este medicamento? Este medicamento se administra mediante infusin por va intravenosa. Lo administra un profesional de la salud en un hospital o en un entorno clnico. Hable con su pediatra para informarse acerca del uso de este medicamento en nios. Puede requerir atencin especial. Sobredosis: Pngase en contacto inmediatamente con un centro toxicolgico o una sala de urgencia si usted cree que haya tomado demasiado medicamento. ATENCIN: Este medicamento es solo para usted. No comparta este medicamento con nadie. Qu sucede si me olvido de una dosis? Es importante no olvidar ninguna dosis. Informe a su mdico o a su profesional de la salud si no puede asistir a una cita. Qu puede interactuar con este medicamento? No se esperan interacciones. Puede ser que esta lista no menciona todas las posibles interacciones. Informe a su profesional de la salud de todos los productos a base de hierbas, medicamentos de venta libre o suplementos nutritivos que est tomando. Si usted fuma, consume bebidas alcohlicas o si utiliza drogas ilegales, indqueselo tambin a su profesional de la salud. Algunas sustancias pueden interactuar con su medicamento. A qu debo estar atento al usar este medicamento? Se supervisar su estado de salud atentamente mientras reciba este medicamento. Si presenta algn efecto secundario, infrmelo. Sin embargo, contine con el tratamiento aun si se siente enfermo, a menos que su mdico le indique que lo suspenda. No debe quedar embarazada mientras est tomando este medicamento o por 7 meses despus de dejar de usarlo. Las mujeres deben informar a su mdico si estn buscando quedar embarazadas o si creen   que estn embarazadas. Las  mujeres con la posibilidad de tener nios deben tener una prueba de embarazo negativa antes de empezar a tomar este medicamento. Existe la posibilidad de que ocurran efectos secundarios graves a un beb sin nacer. Para ms informacin hable con su profesional de la salud o su farmacutico. No debe amamantar a un beb mientras est tomando este medicamento o por 7 meses despus de dejar de usarlo. Las mujeres deben usar un mtodo anticonceptivo eficaz con este medicamento. Consulte a su mdico o a su profesional de la salud si tiene fiebre, escalofros, dolor de garganta, o cualquier otro sntoma de resfro o gripe. No se trate usted mismo. Trate de no acercarse a personas que estn enfermas. Es posible que tenga fiebre, escalofros y dolor de cabeza durante la infusin. Informe cualquier efecto secundario durante la infusin a su profesional de la salud. Qu efectos secundarios puedo tener al utilizar este medicamento? Efectos secundarios que debe informar a su mdico o a su profesional de la salud tan pronto como sea posible: problemas respiratorios dolor en el pecho o palpitaciones mareos sensacin de desmayos o aturdimiento fiebre o escalofros erupcin cutnea, picazn o urticaria dolor de garganta hinchazn de la cara, labios o lengua hinchazn de piernas o tobillos cansancio o debilidad inusual Efectos secundarios que generalmente no requieren atencin mdica (infrmelos a su mdico o a su profesional de la salud si persisten o si son molestos): diarrea cada del cabello nuseas, vmito cansancio Puede ser que esta lista no menciona todos los posibles efectos secundarios. Comunquese a su mdico por asesoramiento mdico sobre los efectos secundarios. Usted puede informar los efectos secundarios a la FDA por telfono al 1-800-FDA-1088. Dnde debo guardar mi medicina? Este medicamento se administra en hospitales o clnicas y no necesitar guardarlo en su domicilio. ATENCIN: Este folleto es un  resumen. Puede ser que no cubra toda la posible informacin. Si usted tiene preguntas acerca de esta medicina, consulte con su mdico, su farmacutico o su profesional de la salud.  2021 Elsevier/Gold Standard (2016-06-27 00:00:00)  

## 2020-08-10 NOTE — Patient Instructions (Signed)
Implanted Port Insertion, Care After This sheet gives you information about how to care for yourself after your procedure. Your health care provider may also give you more specific instructions. If you have problems or questions, contact your health care provider. What can I expect after the procedure? After the procedure, it is common to have:  Discomfort at the port insertion site.  Bruising on the skin over the port. This should improve over 3-4 days. Follow these instructions at home: Port care  After your port is placed, you will get a manufacturer's information card. The card has information about your port. Keep this card with you at all times.  Take care of the port as told by your health care provider. Ask your health care provider if you or a family member can get training for taking care of the port at home. A home health care nurse may also take care of the port.  Make sure to remember what type of port you have. Incision care  Follow instructions from your health care provider about how to take care of your port insertion site. Make sure you: ? Wash your hands with soap and water before and after you change your bandage (dressing). If soap and water are not available, use hand sanitizer. ? Change your dressing as told by your health care provider. ? Leave stitches (sutures), skin glue, or adhesive strips in place. These skin closures may need to stay in place for 2 weeks or longer. If adhesive strip edges start to loosen and curl up, you may trim the loose edges. Do not remove adhesive strips completely unless your health care provider tells you to do that.  Check your port insertion site every day for signs of infection. Check for: ? Redness, swelling, or pain. ? Fluid or blood. ? Warmth. ? Pus or a bad smell.      Activity  Return to your normal activities as told by your health care provider. Ask your health care provider what activities are safe for you.  Do not  lift anything that is heavier than 10 lb (4.5 kg), or the limit that you are told, until your health care provider says that it is safe. General instructions  Take over-the-counter and prescription medicines only as told by your health care provider.  Do not take baths, swim, or use a hot tub until your health care provider approves. Ask your health care provider if you may take showers. You may only be allowed to take sponge baths.  Do not drive for 24 hours if you were given a sedative during your procedure.  Wear a medical alert bracelet in case of an emergency. This will tell any health care providers that you have a port.  Keep all follow-up visits as told by your health care provider. This is important. Contact a health care provider if:  You cannot flush your port with saline as directed, or you cannot draw blood from the port.  You have a fever or chills.  You have redness, swelling, or pain around your port insertion site.  You have fluid or blood coming from your port insertion site.  Your port insertion site feels warm to the touch.  You have pus or a bad smell coming from the port insertion site. Get help right away if:  You have chest pain or shortness of breath.  You have bleeding from your port that you cannot control. Summary  Take care of the port as told by your   health care provider. Keep the manufacturer's information card with you at all times.  Change your dressing as told by your health care provider.  Contact a health care provider if you have a fever or chills or if you have redness, swelling, or pain around your port insertion site.  Keep all follow-up visits as told by your health care provider. This information is not intended to replace advice given to you by your health care provider. Make sure you discuss any questions you have with your health care provider. Document Revised: 12/23/2017 Document Reviewed: 12/23/2017 Elsevier Patient Education   2021 Elsevier Inc.  

## 2020-08-17 ENCOUNTER — Other Ambulatory Visit: Payer: Self-pay | Admitting: Oncology

## 2020-08-17 ENCOUNTER — Other Ambulatory Visit: Payer: Medicaid Other

## 2020-08-17 ENCOUNTER — Other Ambulatory Visit: Payer: Self-pay

## 2020-08-17 ENCOUNTER — Ambulatory Visit
Admission: RE | Admit: 2020-08-17 | Discharge: 2020-08-17 | Disposition: A | Discharge status: Home / Self Care | Payer: Medicaid Other | Source: Ambulatory Visit | Attending: Oncology | Admitting: Oncology

## 2020-08-17 ENCOUNTER — Ambulatory Visit: Payer: Medicaid Other | Admitting: Oncology

## 2020-08-17 ENCOUNTER — Ambulatory Visit: Payer: Medicaid Other

## 2020-08-17 DIAGNOSIS — Z170 Estrogen receptor positive status [ER+]: Secondary | ICD-10-CM

## 2020-08-24 ENCOUNTER — Other Ambulatory Visit: Payer: Self-pay | Admitting: Oncology

## 2020-08-24 ENCOUNTER — Other Ambulatory Visit: Payer: Self-pay

## 2020-08-24 ENCOUNTER — Ambulatory Visit
Admission: RE | Admit: 2020-08-24 | Discharge: 2020-08-24 | Disposition: A | Discharge status: Home / Self Care | Payer: Medicaid Other | Source: Ambulatory Visit | Attending: Oncology | Admitting: Oncology

## 2020-08-24 DIAGNOSIS — C50411 Malignant neoplasm of upper-outer quadrant of right female breast: Secondary | ICD-10-CM

## 2020-08-24 DIAGNOSIS — Z170 Estrogen receptor positive status [ER+]: Secondary | ICD-10-CM

## 2020-08-24 DIAGNOSIS — R222 Localized swelling, mass and lump, trunk: Secondary | ICD-10-CM | POA: Diagnosis not present

## 2020-08-24 DIAGNOSIS — N641 Fat necrosis of breast: Secondary | ICD-10-CM | POA: Diagnosis not present

## 2020-08-31 ENCOUNTER — Inpatient Hospital Stay: Payer: Medicaid Other

## 2020-08-31 ENCOUNTER — Other Ambulatory Visit: Payer: Self-pay

## 2020-08-31 VITALS — BP 132/93 | HR 99 | Temp 98.2°F | Resp 18

## 2020-08-31 DIAGNOSIS — C50411 Malignant neoplasm of upper-outer quadrant of right female breast: Secondary | ICD-10-CM

## 2020-08-31 DIAGNOSIS — C773 Secondary and unspecified malignant neoplasm of axilla and upper limb lymph nodes: Secondary | ICD-10-CM | POA: Diagnosis not present

## 2020-08-31 DIAGNOSIS — Z171 Estrogen receptor negative status [ER-]: Secondary | ICD-10-CM | POA: Diagnosis not present

## 2020-08-31 DIAGNOSIS — Z5112 Encounter for antineoplastic immunotherapy: Secondary | ICD-10-CM | POA: Diagnosis not present

## 2020-08-31 DIAGNOSIS — Z95828 Presence of other vascular implants and grafts: Secondary | ICD-10-CM

## 2020-08-31 DIAGNOSIS — Z79899 Other long term (current) drug therapy: Secondary | ICD-10-CM | POA: Diagnosis not present

## 2020-08-31 LAB — CBC WITH DIFFERENTIAL/PLATELET
Abs Immature Granulocytes: 0.01 10*3/uL (ref 0.00–0.07)
Basophils Absolute: 0 10*3/uL (ref 0.0–0.1)
Basophils Relative: 0 %
Eosinophils Absolute: 0.1 10*3/uL (ref 0.0–0.5)
Eosinophils Relative: 3 %
HCT: 32.7 % — ABNORMAL LOW (ref 36.0–46.0)
Hemoglobin: 11.1 g/dL — ABNORMAL LOW (ref 12.0–15.0)
Immature Granulocytes: 0 %
Lymphocytes Relative: 26 %
Lymphs Abs: 1.3 10*3/uL (ref 0.7–4.0)
MCH: 31.2 pg (ref 26.0–34.0)
MCHC: 33.9 g/dL (ref 30.0–36.0)
MCV: 91.9 fL (ref 80.0–100.0)
Monocytes Absolute: 0.4 10*3/uL (ref 0.1–1.0)
Monocytes Relative: 7 %
Neutro Abs: 3.1 10*3/uL (ref 1.7–7.7)
Neutrophils Relative %: 64 %
Platelets: 288 10*3/uL (ref 150–400)
RBC: 3.56 MIL/uL — ABNORMAL LOW (ref 3.87–5.11)
RDW: 12.5 % (ref 11.5–15.5)
WBC: 4.9 10*3/uL (ref 4.0–10.5)
nRBC: 0 % (ref 0.0–0.2)

## 2020-08-31 LAB — COMPREHENSIVE METABOLIC PANEL
ALT: 64 U/L — ABNORMAL HIGH (ref 0–44)
AST: 57 U/L — ABNORMAL HIGH (ref 15–41)
Albumin: 3.8 g/dL (ref 3.5–5.0)
Alkaline Phosphatase: 129 U/L — ABNORMAL HIGH (ref 38–126)
Anion gap: 12 (ref 5–15)
BUN: 13 mg/dL (ref 6–20)
CO2: 27 mmol/L (ref 22–32)
Calcium: 9.6 mg/dL (ref 8.9–10.3)
Chloride: 102 mmol/L (ref 98–111)
Creatinine, Ser: 0.79 mg/dL (ref 0.44–1.00)
GFR, Estimated: >60 mL/min (ref >60)
Glucose, Bld: 106 mg/dL — ABNORMAL HIGH (ref 70–99)
Potassium: 3.8 mmol/L (ref 3.5–5.1)
Sodium: 141 mmol/L (ref 135–145)
Total Bilirubin: 0.6 mg/dL (ref 0.3–1.2)
Total Protein: 7.5 g/dL (ref 6.5–8.1)

## 2020-08-31 LAB — PREGNANCY, URINE: Preg Test, Ur: NEGATIVE

## 2020-08-31 MED ORDER — SODIUM CHLORIDE 0.9 % IV SOLN
Freq: Once | INTRAVENOUS | Status: AC
  Filled 2020-08-31: qty 250

## 2020-08-31 MED ORDER — TRASTUZUMAB-DKST CHEMO 150 MG IV SOLR
6.0000 mg/kg | Freq: Once | INTRAVENOUS | Status: AC
  Administered 2020-08-31: 525 mg via INTRAVENOUS
  Filled 2020-08-31: qty 25

## 2020-08-31 MED ORDER — DIPHENHYDRAMINE HCL 25 MG PO CAPS
ORAL_CAPSULE | ORAL | Status: AC
  Filled 2020-08-31: qty 2

## 2020-08-31 MED ORDER — SODIUM CHLORIDE 0.9 % IV SOLN
420.0000 mg | Freq: Once | INTRAVENOUS | Status: AC
  Administered 2020-08-31: 420 mg via INTRAVENOUS
  Filled 2020-08-31: qty 14

## 2020-08-31 MED ORDER — SODIUM CHLORIDE 0.9% FLUSH
10.0000 mL | INTRAVENOUS | Status: DC | PRN
  Administered 2020-08-31: 10 mL
  Filled 2020-08-31: qty 10

## 2020-08-31 MED ORDER — ACETAMINOPHEN 325 MG PO TABS
ORAL_TABLET | ORAL | Status: AC
  Filled 2020-08-31: qty 2

## 2020-08-31 MED ORDER — ACETAMINOPHEN 325 MG PO TABS
650.0000 mg | ORAL_TABLET | Freq: Once | ORAL | Status: AC
  Administered 2020-08-31: 650 mg via ORAL

## 2020-08-31 MED ORDER — DIPHENHYDRAMINE HCL 25 MG PO CAPS
50.0000 mg | ORAL_CAPSULE | Freq: Once | ORAL | Status: AC
  Administered 2020-08-31: 50 mg via ORAL

## 2020-08-31 NOTE — Patient Instructions (Signed)
[facility] Discharge Instructions for Patients Receiving Chemotherapy  Today you received the following chemotherapy agents Trastuzumab-dkst ([place]) & Pertuzumab (PERJETA).  To help prevent nausea and vomiting after your treatment, we encourage you to take your nausea medication as prescribed.   If you develop nausea and vomiting that is not controlled by your nausea medication, call the clinic.   BELOW ARE SYMPTOMS THAT SHOULD BE REPORTED IMMEDIATELY:  *FEVER GREATER THAN 100.5 F  *CHILLS WITH OR WITHOUT FEVER  NAUSEA AND VOMITING THAT IS NOT CONTROLLED WITH YOUR NAUSEA MEDICATION  *UNUSUAL SHORTNESS OF BREATH  *UNUSUAL BRUISING OR BLEEDING  TENDERNESS IN MOUTH AND THROAT WITH OR WITHOUT PRESENCE OF ULCERS  *URINARY PROBLEMS  *BOWEL PROBLEMS  UNUSUAL RASH Items with * indicate a potential emergency and should be followed up as soon as possible.  Feel free to call the clinic should you have any questions or concerns. The clinic phone number is [phone].  Please show the [place] at check-in to the Emergency Department and triage nurse.  Trastuzumab; Hyaluronidase injection Qu es este medicamento? TRASTUZUMAB; HIALURONIDASA se [place] para tratar el cncer de mama y el cncer de estmago. El trastuzumab es un anticuerpo monoclonal. La hialuronidasa se [place] para mejorar los efectos del trastuzumab. Este medicamento puede ser utilizado para otros usos; si tiene alguna pregunta consulte con su proveedor de atencin mdica o con su farmacutico. MARCAS COMUNES: HERCEPTIN HYLECTA Qu le debo informar a mi profesional de la salud antes de tomar este medicamento? Necesitan saber si usted presenta alguno de los siguientes problemas o situaciones: enfermedad cardiaca insuficiencia cardiaca enfermedad pulmonar o respiratoria, como asma una reaccin alrgica o inusual al trastuzumab, a otros medicamentos, alimentos, colorantes o conservantes si est embarazada  o buscando quedar embarazada si est amamantando a un beb Cmo debo utilizar este medicamento? Este medicamento se administra mediante una inyeccin por va subcutnea. Lo administra un profesional de Technical sales engineer en un hospital o en un entorno clnico. Hable con su pediatra para informarse acerca del uso de este medicamento en nios. Este medicamento no est aprobado para uso en nios. Sobredosis: Pngase en contacto inmediatamente con un centro toxicolgico o una sala de urgencia si usted cree que haya tomado demasiado medicamento. ATENCIN: [employer] es solo para usted. No comparta este medicamento con nadie. Qu sucede si me olvido de una dosis? Es importante no olvidar ninguna dosis. Informe a su mdico o a su profesional de la salud si no puede asistir a Photographer. Qu puede interactuar con este medicamento? Este medicamento podra interactuar con los siguientes frmacos: ciertos tipos de quimioterapia, tales como daunorubicina, [place], [place], e idarubicina Puede ser que esta lista no menciona todas las posibles interacciones. Informe a su profesional de [employer]	[place] de [employer] productos a base de hierbas, medicamentos de [place] o suplementos nutritivos que est tomando. Si usted fuma, consume bebidas alcohlicas o si utiliza drogas ilegales, indqueselo tambin a su profesional de KB Home	[place]. Algunas sustancias pueden interactuar con su medicamento. A qu debo estar atento al usar [employer]? Visite a su mdico para que revise su evolucin. Si presenta algn efecto secundario, infrmelo. Contine con el tratamiento aun si se siente enfermo, a menos que su mdico le indique que lo suspenda. Consulte a su mdico o a su profesional de la salud si tiene fiebre, escalofros o dolor de garganta, o cualquier otro sntoma de resfro o gripe. No se trate usted mismo. Trate de no acercarse a Advertising copywriter  estn enfermas. Es posible que tenga [place], escalofros y temblores  durante su primera infusin. Estos efectos generalmente son leves y pueden tratarse con otros medicamentos. Informe cualquier efecto secundario durante la infusin a su profesional de [employer]	[place]. La fiebre y los escalofros por lo general no suceden con las infusiones posteriores. No debe quedar embarazada mientras est usando este medicamento o por 7 meses despus de dejar de usarlo. Las mujeres deben informar a su mdico si estn buscando quedar embarazadas o si creen que podran estar embarazadas. Las mujeres con la posibilidad de Best boy nios deben tener una prueba de embarazo negativa antes de Art gallery manager a tomar este medicamento. Existe la posibilidad de efectos secundarios graves en un beb sin nacer. Para obtener ms informacin, hable con su profesional de la salud o su farmacutico. No debe amamantar a un beb mientras est tomando este medicamento o por 7 meses despus de dejar de usarlo. Qu efectos secundarios puedo tener al [employer] este medicamento? Efectos secundarios que debe informar a su mdico o a Barrister's clerk de la salud tan pronto como sea posible: Chief of Staff, como erupcin cutnea, comezn/picazn o urticaria, e hinchazn de la cara, los labios o la lengua problemas respiratorios dolor en el pecho o palpitaciones tos fiebre sensacin general de estar enfermo o sntomas gripales signos de empeoramiento de la insuficiencia cardiaca, tales como problemas respiratorios; hinchazn en las piernas y los pies Efectos secundarios que generalmente no requieren atencin mdica (debe informarlos a su mdico o a Barrister's clerk de la salud si persisten o si son molestos): dolor de Psychiatric nurse sentido del gusto diarrea dolor en las articulaciones nuseas, vmito cansancio o debilidad inusual prdida de peso Puede ser que esta lista no menciona todos los posibles efectos secundarios. Comunquese a su mdico por asesoramiento mdico [employer] Usted puede informar los  efectos secundarios a la FDA por telfono al 1-800-FDA-1088. Dnde debo guardar mi medicina? Este medicamento se administra en hospitales o clnicas, y no necesitar guardarlo en su domicilio. ATENCIN: Este folleto es un resumen. Puede ser que no cubra toda la posible informacin. Si usted tiene preguntas acerca de esta medicina, consulte con su mdico, su farmacutico o su profesional de Technical sales engineer.  2021 Elsevier/Gold Standard (2019-11-30 00:00:00)  Pertuzumab injection Qu es este medicamento? El PERTUZUMAB es un anticuerpo monoclonal. Genene Churn para tratar el cncer de mama. Este medicamento puede ser utilizado para otros usos; si tiene alguna pregunta consulte con su proveedor de atencin mdica o con su farmacutico. MARCAS COMUNES: PERJETA Qu le debo informar a mi profesional de la salud antes de tomar este medicamento? Necesita saber si usted presenta alguno de los siguientes problemas o situaciones: enfermedad cardiaca insuficiencia cardiaca alta presin sangunea antecedentes de pulso cardiaca irregular radioterapia reciente o continuada una reaccin alrgica o inusual al pertuzumab, a otros medicamentos, alimentos, colorantes o conservantes si est embarazada o buscando quedar embarazada si est amamantando a un beb Cmo debo utilizar este medicamento? Este medicamento se administra mediante infusin por va intravenosa. Lo administra un profesional de Technical sales engineer en un hospital o en un entorno clnico. Hable con su pediatra para informarse acerca del uso de este medicamento en nios. Puede requerir atencin especial. Sobredosis: Pngase en contacto inmediatamente con un centro toxicolgico o una sala de urgencia si usted cree que haya tomado demasiado medicamento. ATENCIN: [employer] es solo para usted. No comparta este medicamento con nadie. Qu sucede si me olvido de una dosis? Es importante no olvidar ninguna  dosis. Informe a su mdico o a su profesional de la salud si  no puede asistir a Photographer. Qu puede interactuar con este medicamento? No se esperan interacciones. Puede ser que esta lista no menciona todas las posibles interacciones. Informe a su profesional de KB Home	[place] de [employer] productos a base de hierbas, medicamentos de [place] o suplementos nutritivos que est tomando. Si usted fuma, consume bebidas alcohlicas o si utiliza drogas ilegales, indqueselo tambin a su profesional de [employer]	[place]. Algunas sustancias pueden interactuar con su medicamento. A qu debo estar atento al usar [employer]? Se supervisar su estado de salud atentamente mientras reciba este medicamento. Si presenta algn efecto secundario, infrmelo. Sin embargo, contine con el tratamiento aun si se siente enfermo, a menos que su mdico le indique que lo suspenda. No debe quedar embarazada mientras est tomando este medicamento o por 7 meses despus de dejar de usarlo. Las mujeres deben informar a su mdico si estn buscando quedar embarazadas o si creen que estn embarazadas. Las mujeres con la posibilidad de Best boy nios deben tener una prueba de embarazo negativa antes de Art gallery manager a tomar este medicamento. Existe la posibilidad de que ocurran efectos secundarios graves a un beb sin nacer. Para ms informacin hable con su profesional de la salud o su farmacutico. No debe amamantar a un beb mientras est tomando este medicamento o por 7 meses despus de dejar de usarlo. Las [employer] deben usar un mtodo anticonceptivo eficaz con este medicamento. Consulte a su mdico o a su profesional de la salud si tiene fiebre, escalofros, dolor de garganta, o cualquier otro sntoma de resfro o gripe. No se trate usted mismo. Trate de no acercarse a personas que estn enfermas. Es posible que tenga [place], escalofros y dolor de cabeza durante la infusin. Informe cualquier efecto secundario durante la infusin a su profesional de [employer]	[place]. Qu efectos secundarios puedo tener al [employer]  este medicamento? Efectos secundarios que debe informar a su mdico o a Barrister's clerk de la salud tan pronto como sea posible: Arboriculturist o palpitaciones mareos sensacin de Youth worker o aturdimiento fiebre o escalofros erupcin cutnea, picazn o urticaria dolor de garganta hinchazn de la cara, labios o lengua hinchazn de piernas o tobillos cansancio o debilidad inusual Efectos secundarios que generalmente no requieren atencin mdica (infrmelos a su mdico o a su profesional de la salud si persisten o si son molestos): diarrea cada del cabello nuseas, vmito cansancio Puede ser que esta lista no menciona todos los posibles efectos secundarios. Comunquese a su mdico por asesoramiento mdico [employer] Usted puede informar los efectos secundarios a la FDA por telfono al 1-800-FDA-1088. Dnde debo guardar mi medicina? Este medicamento se administra en hospitales o clnicas y no necesitar guardarlo en su domicilio. ATENCIN: Este folleto es un resumen. Puede ser que no cubra toda la posible informacin. Si usted tiene preguntas acerca de esta medicina, consulte con su mdico, su farmacutico o su profesional de Technical sales engineer.  2021 Elsevier/Gold Standard (2016-06-27 00:00:00)

## 2020-09-01 ENCOUNTER — Encounter: Payer: Self-pay | Admitting: Licensed Clinical Social Worker

## 2020-09-01 NOTE — Progress Notes (Signed)
[facility] CSW Progress Note  Clinical Education officer, museum received TC from patient as she has been denied disability and wants to know what to do. Utilized telephonic interpreter ID [phone] to complete the call.    Per patient, she received a denial letter for disability but still cannot work and needs help. Unfortunately she has utilized much of the other assistance available. CSW advised to work with a Chief Executive Officer, such as through Scientist, research (physical sciences) Aid, to assist with completing the appeals process. Patient did not want to do this and only wants a letter supporting her need to be out of work. She stated that she will ask her doctor on Monday. CSW reiterated that the SSA has a particular process for appeals and that her case can be helped by working with a lawyer who knows that process. Pt still declined. Stated she will call CSW back if she wants the contact number.     Christeen Douglas , LCSW

## 2020-09-04 DIAGNOSIS — I10 Essential (primary) hypertension: Secondary | ICD-10-CM | POA: Diagnosis not present

## 2020-09-04 DIAGNOSIS — I972 Postmastectomy lymphedema syndrome: Secondary | ICD-10-CM | POA: Diagnosis not present

## 2020-09-04 DIAGNOSIS — G8928 Other chronic postprocedural pain: Secondary | ICD-10-CM | POA: Diagnosis not present

## 2020-09-04 DIAGNOSIS — C50919 Malignant neoplasm of unspecified site of unspecified female breast: Secondary | ICD-10-CM | POA: Diagnosis not present

## 2020-09-08 DIAGNOSIS — Z419 Encounter for procedure for purposes other than remedying health state, unspecified: Secondary | ICD-10-CM | POA: Diagnosis not present

## 2020-09-11 DIAGNOSIS — C50911 Malignant neoplasm of unspecified site of right female breast: Secondary | ICD-10-CM | POA: Diagnosis not present

## 2020-09-20 ENCOUNTER — Encounter: Payer: Self-pay | Admitting: *Deleted

## 2020-09-20 NOTE — Progress Notes (Signed)
Alice [name]  [phone] [phone]     ID: Alice [name] DOB: [birth year]  MR#: [id number]  IRS#:[id number]  Patient Care Team: Patient, No Pcp Per (Inactive) as PCP - General (General Practice) Mauro Kaufmann, RN as Oncology Nurse Navigator Rockwell Germany, RN as Oncology Nurse Navigator Alphonsa Overall, Alice [name] as Consulting Physician (General Surgery) Alice [name], Alice Dad, Alice [name] as Consulting Physician (Oncology) Kyung Rudd, Alice [name] as Consulting Physician (Radiation Oncology) Larey Dresser, Alice [name] as Consulting Physician (Cardiology) Drue Flirt, Alice [name] (Family Medicine) Chauncey Cruel, Alice [name] OTHER Alice [name]:  CHIEF COMPLAINT: estrogen receptor negative, 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    She continues on maintenance trastuzumab and pertuzumab, with a dose due today.  She tolerates this with no side effects that she is aware of.  Her most recent echocardiogram on 07/17/2020 showing an ejection fraction of >75%.  At her last visit, she was noted to have a palpable abnormality on her right chest wall at the surgical site. She underwent right breast ultrasound on 08/17/2020 showing: indeterminate palpable 1.5 cm area of concern within outer right chest wall.  She proceeded to biopsy of the area on 08/24/2020. 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   REVIEW OF SYSTEMS: Alice [name] is depressed because she was not granted disability.  The reason for disability was low back pain.  We discussed the possible causes of low back pain and I offered to put her in physical therapy but she prefers not to.  I am giving her some exercises that she can follow-up to see if that helps her.  A detailed review of systems today was otherwise stable.   COVID 19  VACCINATION STATUS: Refuses vaccination   HISTORY OF CURRENT ILLNESS: From the original intake note:  Alice [name] presented with right breast pain with swelling and skin changes (duskiness and dimpling) and left breast pain at 6 o'clock. She underwent bilateral diagnostic mammography with tomography and bilateral breast ultrasonography at [facility] on 09/23/2019 showing: breast density category C; 5.2 cm irregular mass in right breast spanning 10-12 o'clock, with marked peau d'orange changes concerning for inflammatory component; 3.3 cm enlarged lymph node in right axilla; indeterminate 1 cm oval mass in left breast at 5 o'clock.  Accordingly on 09/30/2019 she proceeded to biopsy of the right breast area in question. The pathology from this procedure ([id number]) showed: invasive mammary carcinoma, grade 3, e-cadherin positive. Prognostic indicators significant for: estrogen receptor, 0% negative and progesterone receptor, 0% negative. Proliferation marker Ki67 at 80%. HER2 positive by immunohistochemistry (3+).  The biopsied right axillary lymph node was positive for metastatic carcinoma.  She also underwent cyst aspiration of the 1 cm mass in the left breast the same day.  The patient's subsequent history is as detailed below.   PAST MEDICAL HISTORY: Past Medical History:  Diagnosis Date  . Anemia   . Breast cancer ([place])    Right  . Hypertension     PAST SURGICAL HISTORY: Past Surgical History:  Procedure Laterality Date  . BREAST BIOPSY    . CHOLECYSTECTOMY    . IR IMAGING GUIDED PORT INSERTION  10/13/2019  . MASTECTOMY MODIFIED RADICAL Right 03/06/2020   Procedure: MASTECTOMY MODIFIED RADICAL;  Surgeon: Alphonsa Overall, Alice [name];  Location: [facility];  Service: General;  Laterality: Right;  RNFA    FAMILY HISTORY: No  family history on file.  As of 09/2019-- her father is 38 and her mother is 62. She has two brothers and one sister. There is no cancer in her family to her knowledge.     GYNECOLOGIC HISTORY:  No LMP recorded. Menarche: 45 years old Age at first live birth: 45 years old GX P 2 LMP regular periods lasting 3 days; periods stopped after the first chemotherapy dose May 2021 Contraceptive: previously used, has not used for the last 2 years HRT n/a  Hysterectomy? no BSO? no   SOCIAL HISTORY: (updated March 2022))  Alice [name] [place]  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 Husband Alice [name] works as a Training and development officer.  He has some health issues as well, including diabetes.  She lives at home with Alice [name] and their two children. Son Alice [name], age 45, has special needs.  Son Alice [name], age 56, is in high school.    ADVANCED DIRECTIVES: In the absence of any documentation to the contrary, the patient's spouse is their HCPOA.    HEALTH MAINTENANCE: Social History   Tobacco Use  . Smoking status: Never Smoker  . Smokeless tobacco: Never Used  Vaping Use  . Vaping Use: Never used  Substance Use Topics  . Alcohol use: Never  . Drug use: Never     Colonoscopy: n/a (age)  PAP: 2020  Bone density: n/a (age)   No Known Allergies  Current Outpatient Medications  Medication Sig Dispense Refill  . lidocaine-prilocaine (EMLA) cream Apply 1 application topically as needed. 30 g 0  . lisinopril (ZESTRIL) 10 MG tablet Take 10 mg by mouth daily.     No current facility-administered medications for this visit.    OBJECTIVE: Spanish speaker who appears stated age  14:   09/21/20 0957  BP: 131/83  Pulse: 93  Resp: 16  Temp: 97.7 F (36.5 C)  SpO2: 100%   Wt Readings from Last 3 Encounters:  09/21/20 207 lb 14.4 oz (94.3 kg)  08/10/20 201 lb 12.8 oz (91.5 kg)  07/20/20 201 lb 8 oz (91.4 kg)   Body mass index is 38.03 kg/m.    ECOG FS:1 - Symptomatic but completely ambulatory   Sclerae unicteric, EOMs intact Wearing a mask No cervical or supraclavicular adenopathy Lungs no rales or rhonchi Heart  regular rate and rhythm Abd soft, nontender, positive bowel sounds MSK no focal spinal tenderness, no upper extremity lymphedema Neuro: nonfocal, well oriented, appropriate affect Breasts: The right breast has undergone mastectomy and radiation.  I do not palpate any areas of concern.  The left breast and both axillae are benign.   LAB RESULTS:  CMP     Component Value Date/Time   NA 142 09/21/2020 0800   K 3.7 09/21/2020 0800   CL 105 09/21/2020 0800   CO2 24 09/21/2020 0800   GLUCOSE 127 (H) 09/21/2020 0800   BUN 13 09/21/2020 0800   CREATININE 0.83 09/21/2020 0800   CREATININE 0.79 10/06/2019 0826   CALCIUM 9.3 09/21/2020 0800   PROT 7.4 09/21/2020 0800   ALBUMIN 3.8 09/21/2020 0800   AST 21 09/21/2020 0800   AST 26 10/06/2019 0826   ALT 36 09/21/2020 0800   ALT 47 (H) 10/06/2019 0826   ALKPHOS 147 (H) 09/21/2020 0800   BILITOT 0.4 09/21/2020 0800   BILITOT 0.7 10/06/2019 0826   GFRNONAA >60 09/21/2020 0800   GFRNONAA >60 10/06/2019 0826   GFRAA >60 03/13/2020 1142   GFRAA >60 10/06/2019 9326  No results found for: TOTALPROTELP, ALBUMINELP, A1GS, A2GS, BETS, BETA2SER, GAMS, MSPIKE, SPEI  Lab Results  Component Value Date   WBC 5.2 09/21/2020   NEUTROABS 3.2 09/21/2020   HGB 11.1 (L) 09/21/2020   HCT 33.9 (L) 09/21/2020   MCV 92.6 09/21/2020   PLT 276 09/21/2020    No results found for: LABCA2  No components found for: ZWCHEN277  No results for input(s): INR in the last 168 hours.  No results found for: LABCA2  No results found for: OEU235  No results found for: TIR443  No results found for: XVQ008  No results found for: CA2729  No components found for: HGQUANT  No results found for: CEA1 / No results found for: CEA1   No results found for: AFPTUMOR  No results found for: CHROMOGRNA  No results found for: KPAFRELGTCHN, LAMBDASER, KAPLAMBRATIO (kappa/lambda light chains)  No results found for: HGBA, HGBA2QUANT, HGBFQUANT,  HGBSQUAN (Hemoglobinopathy evaluation)   No results found for: LDH  No results found for: IRON, TIBC, IRONPCTSAT (Iron and TIBC)  No results found for: FERRITIN  Urinalysis No results found for: COLORURINE, APPEARANCEUR, LABSPEC, PHURINE, GLUCOSEU, HGBUR, BILIRUBINUR, KETONESUR, PROTEINUR, UROBILINOGEN, NITRITE, LEUKOCYTESUR   STUDIES: [place] RT BREAST BX W LOC DEV 1ST LESION IMG BX SPEC US GUIDE  Addendum Date: 08/30/2020   ADDENDUM REPORT: 08/29/2020 10:51 ADDENDUM: The patient was instructed to return for annual screening mammography, LEFT, the patient had a RIGHT mastectomy, due in April 2022. Alice Purser, RN on 08/29/2020. Electronically Signed   By: Lovey Newcomer M.D.   On: 08/29/2020 10:51   Addendum Date: 08/28/2020   ADDENDUM REPORT: 08/28/2020 08:43 ADDENDUM: Pathology revealed ORGANIZING FAT NECROSIS of the Right breast, lateral. This was found to be concordant by Dr. Lovey Newcomer. Pathology results were discussed with the patient by telephone by Kathrine Haddock, Bilingual Patient Services Representative. The patient reported doing well after the biopsy with tenderness at the site. Post biopsy instructions and care were reviewed and questions were answered. The patient was encouraged to call [facility] for any additional concerns. The patient was instructed to continue with monthly self breast examinations, clinical follow-up as needed, and to return for MRI as clinically indicated. Pathology results reported by Alice Purser, RN on 08/28/2020. Electronically Signed   By: Lovey Newcomer M.D.   On: 08/28/2020 08:43   Result Date: 08/30/2020 CLINICAL DATA:  Patient with indeterminate palpable right chest wall mass. EXAM: ULTRASOUND GUIDED RIGHT BREAST CORE NEEDLE BIOPSY COMPARISON:  Previous exam(s). PROCEDURE: I met with the patient and we discussed the procedure of ultrasound-guided biopsy, including benefits and alternatives. We discussed the high likelihood of a  successful procedure. We discussed the risks of the procedure, including infection, bleeding, tissue injury, clip migration, and inadequate sampling. Informed written consent was given. The usual time-out protocol was performed immediately prior to the procedure. Lesion quadrant: Upper outer quadrant Using sterile technique and 1% Lidocaine as local anesthetic, under direct ultrasound visualization, a 14 gauge spring-loaded device was used to perform biopsy of palpable right chest wall mass laterally using a lateral approach. At the conclusion of the procedure heart shaped tissue marker clip was deployed into the biopsy cavity. Follow up 2 view mammogram was performed and dictated separately. IMPRESSION: Ultrasound guided biopsy of palpable right chest wall mass. No apparent complications. Electronically Signed: By: Lovey Newcomer M.D. On: 08/24/2020 14:28     ELIGIBLE FOR AVAILABLE RESEARCH PROTOCOL: no  ASSESSMENT: 45 y.o. [place] speaker  status post right breast upper outer quadrant sites biopsy and right axillary lymph node biopsy 09/30/2019 for a clinical T3 N2, stage IIIA invasive ductal carcinoma, grade 3, estrogen and progesterone receptor negative, HER-2 amplified, with an MIB-1 of 80%.  (a) CT chest abdomen and pelvis and bone scan 10/15/2019 showed no evidence of metastatic disease  (1) genetics testing 10/13/2019 through the Common Hereditary Cancers Panel offered by [facility] found no deleterious mutations in APC, ATM, AXIN2, BARD1, BMPR1A, BRCA1, BRCA2, BRIP1, CDH1, CDKN2A (p14ARF), CDKN2A (p16INK4a), CKD4, CHEK2, CTNNA1, DICER1, EPCAM (Deletion/duplication testing only), GREM1 (promoter region deletion/duplication testing only), KIT, MEN1, MLH1, MSH2, MSH3, MSH6, MUTYH, NBN, NF1, NHTL1, PALB2, PDGFRA, PMS2, POLD1, POLE, PTEN, RAD50, RAD51C, RAD51D, RNF43, SDHB, SDHC, SDHD, SMAD4, SMARCA4. STK11, TP53, TSC1, TSC2, and VHL.  The following genes were evaluated for sequence changes only: SDHA  and HOXB13 c.251G>A variant only.  (2) neoadjuvant chemotherapy consisting of trastuzumab, pertuzumab, docetaxel and carboplatin every 21 days x 6 started 10/19/2019, completed 02/01/2020  (3) trastuzumab and pertuzumab to be continued to total 1 year (through May 2022).  (a) echo 10/15/2019 shows an ejection fraction in the 65-70% range.  (b) echo 04/25/2020 shows an ejection fraction in the 60-65% range  (c) echo 07/17/2020 shows an ejection fraction in excess of 75%  (4) status post right modified radical mastectomy on 03/06/2020 showing no residual cancer in the breast or lymph nodes (ypT0 ypN0)  (a) a total of 7 right axillary lymph nodes were removed  (5) adjuvant radiation 04/13/20-05/30/20:   The right chest wall and regional nodes were treated to 50.4 Gy in 28 fractions, followed by a 10 Gy boost in 5 fractions.    PLAN: Aysha is now just over half a year from definitive surgery for her breast cancer with no evidence of disease recurrence.  This is favorable.  She is continuing anti-HER2 immunotherapy and has 2 more doses after today.  I will see her with the final dose.  After that we can remove the port.  I am giving her some exercises for her back which I hope will be helpful to her.  She knows to call for any other issue that may develop before the next visit, which will be in 6 weeks.  She tells me her primary care physician has started her on gabapentin 100 mg 3 times a day but this makes her too sleepy.  She is only taking it at that time.  Encounter time 20 minutes.  Alice [name]. Alice Bozzi, Alice [name] 09/21/20 10:15 AM Medical Oncology and Hematology [facility] [place], [place] [address] Tel. [phone]    Fax. [phone]   I, Alice [name], am acting as scribe for Dr. Virgie [name]. Elyshia Kumagai.  I, Alice [name], have reviewed the above documentation for accuracy and completeness, and I agree with the above.   *Total Encounter Time as  defined by the Centers for Medicare and Medicaid Services includes, in addition to the face-to-face time of a patient visit (documented in the note above) non-face-to-face time: obtaining and reviewing outside history, ordering and reviewing medications, tests or procedures, care coordination (communications with other health care professionals or caregivers) and documentation in the medical record.

## 2020-09-21 ENCOUNTER — Inpatient Hospital Stay: Payer: Medicaid Other

## 2020-09-21 ENCOUNTER — Inpatient Hospital Stay (HOSPITAL_BASED_OUTPATIENT_CLINIC_OR_DEPARTMENT_OTHER): Payer: Medicaid Other | Admitting: Oncology

## 2020-09-21 ENCOUNTER — Inpatient Hospital Stay: Payer: Medicaid Other | Attending: Oncology

## 2020-09-21 ENCOUNTER — Other Ambulatory Visit: Payer: Self-pay

## 2020-09-21 VITALS — BP 131/83 | HR 93 | Temp 97.7°F | Resp 16 | Ht 62.0 in | Wt 207.9 lb

## 2020-09-21 DIAGNOSIS — Z171 Estrogen receptor negative status [ER-]: Secondary | ICD-10-CM | POA: Insufficient documentation

## 2020-09-21 DIAGNOSIS — C50411 Malignant neoplasm of upper-outer quadrant of right female breast: Secondary | ICD-10-CM | POA: Diagnosis not present

## 2020-09-21 DIAGNOSIS — Z170 Estrogen receptor positive status [ER+]: Secondary | ICD-10-CM

## 2020-09-21 DIAGNOSIS — Z5112 Encounter for antineoplastic immunotherapy: Secondary | ICD-10-CM | POA: Insufficient documentation

## 2020-09-21 DIAGNOSIS — Z79899 Other long term (current) drug therapy: Secondary | ICD-10-CM | POA: Insufficient documentation

## 2020-09-21 DIAGNOSIS — C773 Secondary and unspecified malignant neoplasm of axilla and upper limb lymph nodes: Secondary | ICD-10-CM | POA: Insufficient documentation

## 2020-09-21 LAB — CBC WITH DIFFERENTIAL/PLATELET
Abs Immature Granulocytes: 0.02 10*3/uL (ref 0.00–0.07)
Basophils Absolute: 0 10*3/uL (ref 0.0–0.1)
Basophils Relative: 0 %
Eosinophils Absolute: 0.1 10*3/uL (ref 0.0–0.5)
Eosinophils Relative: 2 %
HCT: 33.9 % — ABNORMAL LOW (ref 36.0–46.0)
Hemoglobin: 11.1 g/dL — ABNORMAL LOW (ref 12.0–15.0)
Immature Granulocytes: 0 %
Lymphocytes Relative: 28 %
Lymphs Abs: 1.5 10*3/uL (ref 0.7–4.0)
MCH: 30.3 pg (ref 26.0–34.0)
MCHC: 32.7 g/dL (ref 30.0–36.0)
MCV: 92.6 fL (ref 80.0–100.0)
Monocytes Absolute: 0.4 10*3/uL (ref 0.1–1.0)
Monocytes Relative: 7 %
Neutro Abs: 3.2 10*3/uL (ref 1.7–7.7)
Neutrophils Relative %: 63 %
Platelets: 276 10*3/uL (ref 150–400)
RBC: 3.66 MIL/uL — ABNORMAL LOW (ref 3.87–5.11)
RDW: 11.9 % (ref 11.5–15.5)
WBC: 5.2 10*3/uL (ref 4.0–10.5)
nRBC: 0 % (ref 0.0–0.2)

## 2020-09-21 LAB — COMPREHENSIVE METABOLIC PANEL
ALT: 36 U/L (ref 0–44)
AST: 21 U/L (ref 15–41)
Albumin: 3.8 g/dL (ref 3.5–5.0)
Alkaline Phosphatase: 147 U/L — ABNORMAL HIGH (ref 38–126)
Anion gap: 13 (ref 5–15)
BUN: 13 mg/dL (ref 6–20)
CO2: 24 mmol/L (ref 22–32)
Calcium: 9.3 mg/dL (ref 8.9–10.3)
Chloride: 105 mmol/L (ref 98–111)
Creatinine, Ser: 0.83 mg/dL (ref 0.44–1.00)
GFR, Estimated: >60 mL/min (ref >60)
Glucose, Bld: 127 mg/dL — ABNORMAL HIGH (ref 70–99)
Potassium: 3.7 mmol/L (ref 3.5–5.1)
Sodium: 142 mmol/L (ref 135–145)
Total Bilirubin: 0.4 mg/dL (ref 0.3–1.2)
Total Protein: 7.4 g/dL (ref 6.5–8.1)

## 2020-09-21 LAB — PREGNANCY, URINE: Preg Test, Ur: NEGATIVE

## 2020-09-21 MED ORDER — SODIUM CHLORIDE 0.9 % IV SOLN
Freq: Once | INTRAVENOUS | Status: AC
Start: 2020-09-21 — End: 2020-09-21
  Filled 2020-09-21: qty 250

## 2020-09-21 MED ORDER — TRASTUZUMAB-DKST CHEMO 150 MG IV SOLR
6.0000 mg/kg | Freq: Once | INTRAVENOUS | Status: AC
  Administered 2020-09-21: 525 mg via INTRAVENOUS
  Filled 2020-09-21: qty 25

## 2020-09-21 MED ORDER — SODIUM CHLORIDE 0.9 % IV SOLN
420.0000 mg | Freq: Once | INTRAVENOUS | Status: AC
  Administered 2020-09-21: 420 mg via INTRAVENOUS
  Filled 2020-09-21: qty 14

## 2020-09-21 MED ORDER — ACETAMINOPHEN 325 MG PO TABS
ORAL_TABLET | ORAL | Status: AC
  Filled 2020-09-21: qty 2

## 2020-09-21 MED ORDER — HEPARIN SOD (PORK) LOCK FLUSH 100 UNIT/ML IV SOLN
500.0000 [IU] | Freq: Once | INTRAVENOUS | Status: AC | PRN
  Administered 2020-09-21: 500 [IU]
  Filled 2020-09-21: qty 5

## 2020-09-21 MED ORDER — DIPHENHYDRAMINE HCL 25 MG PO CAPS
ORAL_CAPSULE | ORAL | Status: AC
  Filled 2020-09-21: qty 2

## 2020-09-21 MED ORDER — DIPHENHYDRAMINE HCL 25 MG PO CAPS
50.0000 mg | ORAL_CAPSULE | Freq: Once | ORAL | Status: AC
  Administered 2020-09-21: 50 mg via ORAL

## 2020-09-21 MED ORDER — SODIUM CHLORIDE 0.9% FLUSH
10.0000 mL | INTRAVENOUS | Status: DC | PRN
  Administered 2020-09-21: 10 mL
  Filled 2020-09-21: qty 10

## 2020-09-21 MED ORDER — ACETAMINOPHEN 325 MG PO TABS
650.0000 mg | ORAL_TABLET | Freq: Once | ORAL | Status: AC
  Administered 2020-09-21: 650 mg via ORAL

## 2020-09-21 NOTE — Patient Instructions (Signed)
Trastuzumab injection for infusion Qu es este medicamento? El TRASTUZUMAB es un anticuerpo monoclonal. Genene Churn para tratar el cncer de mama y de [place]. Este medicamento puede ser utilizado para otros usos; si tiene alguna pregunta consulte con su proveedor de atencin mdica o con su farmacutico. MARCAS COMUNES: Herceptin, Belenda Cruise, [place], Ontruzant, Trazimera Qu le debo informar a mi profesional de la salud antes de tomar este medicamento? Necesitan saber si usted presenta alguno de los siguientes problemas o situaciones: enfermedad cardiaca insuficiencia cardiaca enfermedad pulmonar o respiratoria, como asma una reaccin alrgica o inusual al trastuzumab, al alcohol benclico, a otros medicamentos, alimentos, colorantes o conservantes si est embarazada o buscando quedar embarazada si est amamantando a un beb Cmo debo utilizar este medicamento? Este medicamento se administra mediante infusin por va intravenosa. Lo administra un profesional de la salud calificado en un hospital o en un entorno clnico. Hable con su pediatra para informarse acerca del uso de este medicamento en nios. Este medicamento no est aprobado para uso en nios. Sobredosis: Pngase en contacto inmediatamente con un centro toxicolgico o una sala de urgencia si usted cree que haya tomado demasiado medicamento. ATENCIN: [employer] es solo para usted. No comparta este medicamento con nadie. Qu sucede si me olvido de una dosis? Es importante no olvidar ninguna dosis. Informe a su mdico o a su profesional de la salud si no puede asistir a Photographer. Qu puede interactuar con este medicamento? Este medicamento podra interactuar con los siguientes frmacos: ciertos tipos de quimioterapia, tales como daunorubicina, [place], [place], e idarubicina Puede ser que esta lista no menciona todas las posibles interacciones. Informe a su profesional de KB Home	[place] de [employer] productos a base  de hierbas, medicamentos de [place] o suplementos nutritivos que est tomando. Si usted fuma, consume bebidas alcohlicas o si utiliza drogas ilegales, indqueselo tambin a su profesional de KB Home	[place]. Algunas sustancias pueden interactuar con su medicamento. A qu debo estar atento al usar Coca-Cola? Visite a su mdico para chequear su evolucin peridicamente. Si presenta algn efecto secundario, infrmelo. Sin embargo, contine con el tratamiento aun si se siente enfermo, a menos que su mdico le indique que lo suspenda. Consulte a su mdico o a su profesional de la salud si tiene fiebre, escalofros, dolor de garganta, o cualquier otro sntoma de resfro o gripe. No se trate usted mismo. Trate de no acercarse a personas que estn enfermas. Es posible que tenga [place], escalofros y temblores durante su primera infusin. Estos efectos generalmente son leves y pueden tratarse con otros medicamentos. Informe cualquier efecto secundario durante la infusin a su profesional de KB Home	[place]. La fiebre y los escalofros por lo general no suceden con las infusiones posteriores. No debe quedar embarazada mientras est tomando este medicamento o por 7 meses despus de dejar de usarlo. Las mujeres deben informar a su mdico si estn buscando quedar embarazadas o si creen que estn embarazadas. Las mujeres con la posibilidad de Best boy nios deben tener una prueba de embarazo negativa antes de Art gallery manager a tomar este medicamento. Existe la posibilidad de que ocurran efectos secundarios graves a un beb sin nacer. Para ms informacin hable con su profesional de la salud o su farmacutico. No debe amamantar a un beb mientras est tomando este medicamento o por 7 meses despus de dejar de usarlo. Las [employer] deben usar un mtodo anticonceptivo eficaz con este medicamento. Qu efectos secundarios puedo tener al [employer] este medicamento? Efectos secundarios que debe informar a su mdico o  a su profesional de la  salud tan pronto como sea posible: Chief of Staff, como erupcin cutnea, comezn/picazn o urticaria, e hinchazn de la cara, los labios o la lengua dolor en el pecho o palpitaciones tos mareos sensacin de Youth worker o aturdimiento, cadas fiebre sensacin general de estar enfermo o sntomas gripales signos de empeoramiento de la insuficiencia cardiaca, tales como problemas respiratorios; hinchazn en las piernas y los pies cansancio o debilidad inusual Efectos secundarios que generalmente no requieren atencin mdica (infrmelos a su mdico o a Barrister's clerk de la salud si persisten o si son molestos): dolor de Psychiatric nurse sentido del gusto diarrea dolor en las articulaciones nuseas, vmito prdida de peso Puede ser que esta lista no menciona todos los posibles efectos secundarios. Comunquese a su mdico por asesoramiento mdico [employer]. Usted puede informar los efectos secundarios a la FDA por telfono al 1-800-FDA-1088. Dnde debo guardar mi medicina? Este medicamento se administra en hospitales o clnicas y no necesitar guardarlo en su domicilio. ATENCIN: Este folleto es un resumen. Puede ser que no cubra toda la posible informacin. Si usted tiene preguntas acerca de esta medicina, consulte con su mdico, su farmacutico o su profesional de Technical sales engineer.  2021 Elsevier/Gold Standard (2019-11-30 00:00:00)  Pertuzumab injection Qu es este medicamento? El PERTUZUMAB es un anticuerpo monoclonal. Genene Churn para tratar el cncer de mama. Este medicamento puede ser utilizado para otros usos; si tiene alguna pregunta consulte con su proveedor de atencin mdica o con su farmacutico. MARCAS COMUNES: PERJETA Qu le debo informar a mi profesional de la salud antes de tomar este medicamento? Necesita saber si usted presenta alguno de los siguientes problemas o situaciones: enfermedad cardiaca insuficiencia cardiaca alta presin sangunea antecedentes de pulso  cardiaca irregular radioterapia reciente o continuada una reaccin alrgica o inusual al pertuzumab, a otros medicamentos, alimentos, colorantes o conservantes si est embarazada o buscando quedar embarazada si est amamantando a un beb Cmo debo utilizar este medicamento? Este medicamento se administra mediante infusin por va intravenosa. Lo administra un profesional de Technical sales engineer en un hospital o en un entorno clnico. Hable con su pediatra para informarse acerca del uso de este medicamento en nios. Puede requerir atencin especial. Sobredosis: Pngase en contacto inmediatamente con un centro toxicolgico o una sala de urgencia si usted cree que haya tomado demasiado medicamento. ATENCIN: [employer] es solo para usted. No comparta este medicamento con nadie. Qu sucede si me olvido de una dosis? Es importante no olvidar ninguna dosis. Informe a su mdico o a su profesional de la salud si no puede asistir a Photographer. Qu puede interactuar con este medicamento? No se esperan interacciones. Puede ser que esta lista no menciona todas las posibles interacciones. Informe a su profesional de KB Home	[place] de [employer] productos a base de hierbas, medicamentos de [place] o suplementos nutritivos que est tomando. Si usted fuma, consume bebidas alcohlicas o si utiliza drogas ilegales, indqueselo tambin a su profesional de KB Home	[place]. Algunas sustancias pueden interactuar con su medicamento. A qu debo estar atento al usar Coca-Cola? Se supervisar su estado de salud atentamente mientras reciba este medicamento. Si presenta algn efecto secundario, infrmelo. Sin embargo, contine con el tratamiento aun si se siente enfermo, a menos que su mdico le indique que lo suspenda. No debe quedar embarazada mientras est tomando este medicamento o por 7 meses despus de dejar de usarlo. Las mujeres deben informar a su mdico si estn buscando quedar embarazadas o si creen  que estn embarazadas. Las  mujeres con la posibilidad de Best boy nios deben tener una prueba de embarazo negativa antes de Art gallery manager a tomar este medicamento. Existe la posibilidad de que ocurran efectos secundarios graves a un beb sin nacer. Para ms informacin hable con su profesional de la salud o su farmacutico. No debe amamantar a un beb mientras est tomando este medicamento o por 7 meses despus de dejar de usarlo. Las [employer] deben usar un mtodo anticonceptivo eficaz con este medicamento. Consulte a su mdico o a su profesional de la salud si tiene fiebre, escalofros, dolor de garganta, o cualquier otro sntoma de resfro o gripe. No se trate usted mismo. Trate de no acercarse a personas que estn enfermas. Es posible que tenga [place], escalofros y dolor de cabeza durante la infusin. Informe cualquier efecto secundario durante la infusin a su profesional de KB Home	[place]. Qu efectos secundarios puedo tener al [employer] este medicamento? Efectos secundarios que debe informar a su mdico o a Barrister's clerk de la salud tan pronto como sea posible: Arboriculturist o palpitaciones mareos sensacin de Youth worker o aturdimiento fiebre o escalofros erupcin cutnea, picazn o urticaria dolor de garganta hinchazn de la cara, labios o lengua hinchazn de piernas o tobillos cansancio o debilidad inusual Efectos secundarios que generalmente no requieren atencin mdica (infrmelos a su mdico o a su profesional de la salud si persisten o si son molestos): diarrea cada del cabello nuseas, vmito cansancio Puede ser que esta lista no menciona todos los posibles efectos secundarios. Comunquese a su mdico por asesoramiento mdico [employer]. Usted puede informar los efectos secundarios a la FDA por telfono al 1-800-FDA-1088. Dnde debo guardar mi medicina? Este medicamento se administra en hospitales o clnicas y no necesitar guardarlo en su domicilio. ATENCIN: Este folleto es un  resumen. Puede ser que no cubra toda la posible informacin. Si usted tiene preguntas acerca de esta medicina, consulte con su mdico, su farmacutico o su profesional de Technical sales engineer.  2021 Elsevier/Gold Standard (2016-06-27 00:00:00)

## 2020-10-08 DIAGNOSIS — Z419 Encounter for procedure for purposes other than remedying health state, unspecified: Secondary | ICD-10-CM | POA: Diagnosis not present

## 2020-10-12 ENCOUNTER — Other Ambulatory Visit: Payer: Medicaid Other

## 2020-10-12 ENCOUNTER — Inpatient Hospital Stay: Payer: Medicaid Other

## 2020-10-12 ENCOUNTER — Inpatient Hospital Stay: Payer: Medicaid Other | Attending: Oncology

## 2020-10-12 ENCOUNTER — Other Ambulatory Visit: Payer: Self-pay

## 2020-10-12 VITALS — BP 134/72 | HR 68 | Temp 98.1°F | Resp 18

## 2020-10-12 DIAGNOSIS — Z5112 Encounter for antineoplastic immunotherapy: Secondary | ICD-10-CM | POA: Insufficient documentation

## 2020-10-12 DIAGNOSIS — Z170 Estrogen receptor positive status [ER+]: Secondary | ICD-10-CM

## 2020-10-12 DIAGNOSIS — Z95828 Presence of other vascular implants and grafts: Secondary | ICD-10-CM

## 2020-10-12 DIAGNOSIS — C773 Secondary and unspecified malignant neoplasm of axilla and upper limb lymph nodes: Secondary | ICD-10-CM | POA: Diagnosis not present

## 2020-10-12 DIAGNOSIS — C50411 Malignant neoplasm of upper-outer quadrant of right female breast: Secondary | ICD-10-CM | POA: Diagnosis not present

## 2020-10-12 DIAGNOSIS — Z79899 Other long term (current) drug therapy: Secondary | ICD-10-CM | POA: Insufficient documentation

## 2020-10-12 DIAGNOSIS — Z171 Estrogen receptor negative status [ER-]: Secondary | ICD-10-CM | POA: Insufficient documentation

## 2020-10-12 LAB — PREGNANCY, URINE: Preg Test, Ur: NEGATIVE

## 2020-10-12 LAB — COMPREHENSIVE METABOLIC PANEL
ALT: 36 U/L (ref 0–44)
AST: 28 U/L (ref 15–41)
Albumin: 3.8 g/dL (ref 3.5–5.0)
Alkaline Phosphatase: 135 U/L — ABNORMAL HIGH (ref 38–126)
Anion gap: 10 (ref 5–15)
BUN: 11 mg/dL (ref 6–20)
CO2: 26 mmol/L (ref 22–32)
Calcium: 9.6 mg/dL (ref 8.9–10.3)
Chloride: 104 mmol/L (ref 98–111)
Creatinine, Ser: 0.81 mg/dL (ref 0.44–1.00)
GFR, Estimated: >60 mL/min (ref >60)
Glucose, Bld: 118 mg/dL — ABNORMAL HIGH (ref 70–99)
Potassium: 3.6 mmol/L (ref 3.5–5.1)
Sodium: 140 mmol/L (ref 135–145)
Total Bilirubin: 0.8 mg/dL (ref 0.3–1.2)
Total Protein: 7.6 g/dL (ref 6.5–8.1)

## 2020-10-12 LAB — CBC WITH DIFFERENTIAL/PLATELET
Abs Immature Granulocytes: 0.01 10*3/uL (ref 0.00–0.07)
Basophils Absolute: 0 10*3/uL (ref 0.0–0.1)
Basophils Relative: 1 %
Eosinophils Absolute: 0.2 10*3/uL (ref 0.0–0.5)
Eosinophils Relative: 3 %
HCT: 34.5 % — ABNORMAL LOW (ref 36.0–46.0)
Hemoglobin: 11.2 g/dL — ABNORMAL LOW (ref 12.0–15.0)
Immature Granulocytes: 0 %
Lymphocytes Relative: 21 %
Lymphs Abs: 1.1 10*3/uL (ref 0.7–4.0)
MCH: 29.9 pg (ref 26.0–34.0)
MCHC: 32.5 g/dL (ref 30.0–36.0)
MCV: 92.2 fL (ref 80.0–100.0)
Monocytes Absolute: 0.4 10*3/uL (ref 0.1–1.0)
Monocytes Relative: 7 %
Neutro Abs: 3.5 10*3/uL (ref 1.7–7.7)
Neutrophils Relative %: 68 %
Platelets: 286 10*3/uL (ref 150–400)
RBC: 3.74 MIL/uL — ABNORMAL LOW (ref 3.87–5.11)
RDW: 12 % (ref 11.5–15.5)
WBC: 5.2 10*3/uL (ref 4.0–10.5)
nRBC: 0 % (ref 0.0–0.2)

## 2020-10-12 MED ORDER — SODIUM CHLORIDE 0.9% FLUSH
10.0000 mL | INTRAVENOUS | Status: DC | PRN
  Administered 2020-10-12: 10 mL
  Filled 2020-10-12: qty 10

## 2020-10-12 MED ORDER — DIPHENHYDRAMINE HCL 25 MG PO CAPS
50.0000 mg | ORAL_CAPSULE | Freq: Once | ORAL | Status: AC
  Administered 2020-10-12: 50 mg via ORAL

## 2020-10-12 MED ORDER — DIPHENHYDRAMINE HCL 25 MG PO CAPS
ORAL_CAPSULE | ORAL | Status: AC
  Filled 2020-10-12: qty 2

## 2020-10-12 MED ORDER — TRASTUZUMAB-DKST CHEMO 150 MG IV SOLR
6.0000 mg/kg | Freq: Once | INTRAVENOUS | Status: AC
  Administered 2020-10-12: 525 mg via INTRAVENOUS
  Filled 2020-10-12: qty 25

## 2020-10-12 MED ORDER — ACETAMINOPHEN 325 MG PO TABS
650.0000 mg | ORAL_TABLET | Freq: Once | ORAL | Status: AC
  Administered 2020-10-12: 650 mg via ORAL

## 2020-10-12 MED ORDER — ACETAMINOPHEN 325 MG PO TABS
ORAL_TABLET | ORAL | Status: AC
  Filled 2020-10-12: qty 2

## 2020-10-12 MED ORDER — SODIUM CHLORIDE 0.9 % IV SOLN
Freq: Once | INTRAVENOUS | Status: AC
Start: 2020-10-12 — End: 2020-10-12
  Filled 2020-10-12: qty 250

## 2020-10-12 MED ORDER — SODIUM CHLORIDE 0.9 % IV SOLN
420.0000 mg | Freq: Once | INTRAVENOUS | Status: AC
  Administered 2020-10-12: 420 mg via INTRAVENOUS
  Filled 2020-10-12: qty 14

## 2020-10-12 NOTE — Patient Instructions (Signed)
[facility] MEDICAL [facility]COLOGY  Discharge Instructions: Thank you for choosing [facility] to provide your oncology and hematology care.   If you have a lab appointment with the Cancer Center, please go directly to the Cancer Center and check in at the registration area.   Wear comfortable clothing and clothing appropriate for easy access to any Portacath or PICC line.   We strive to give you quality time with your provider. You may need to reschedule your appointment if you arrive late (15 or more minutes).  Arriving late affects you and other patients whose appointments are after yours.  Also, if you miss three or more appointments without notifying the office, you may be dismissed from the clinic at the provider's discretion.      For prescription refill requests, have your pharmacy contact our office and allow 72 hours for refills to be completed.    Today you received the following chemotherapy and/or immunotherapy agents: Trastuzumab, Pertuzumab.      To help prevent nausea and vomiting after your treatment, we encourage you to take your nausea medication as directed.  BELOW ARE SYMPTOMS THAT SHOULD BE REPORTED IMMEDIATELY: *FEVER GREATER THAN 100.4 F (38 C) OR HIGHER *CHILLS OR SWEATING *NAUSEA AND VOMITING THAT IS NOT CONTROLLED WITH YOUR NAUSEA MEDICATION *UNUSUAL SHORTNESS OF BREATH *UNUSUAL BRUISING OR BLEEDING *URINARY PROBLEMS (pain or burning when urinating, or frequent urination) *BOWEL PROBLEMS (unusual diarrhea, constipation, pain near the anus) TENDERNESS IN MOUTH AND THROAT WITH OR WITHOUT PRESENCE OF ULCERS (sore throat, sores in mouth, or a toothache) UNUSUAL RASH, SWELLING OR PAIN  UNUSUAL VAGINAL DISCHARGE OR ITCHING   Items with * indicate a potential emergency and should be followed up as soon as possible or go to the Emergency Department if any problems should occur.  Please show the CHEMOTHERAPY ALERT CARD or IMMUNOTHERAPY ALERT CARD  at check-in to the Emergency Department and triage nurse.  Should you have questions after your visit or need to cancel or reschedule your appointment, please contact [facility]  Dept: [phone]  and follow the prompts.  Office hours are 8:00 a.m. to 4:30 p.m. Monday - Friday. Please note that voicemails left after 4:00 p.m. may not be returned until the following business day.  We are closed weekends and major holidays. You have access to a nurse at all times for urgent questions. Please call the main number to the clinic Dept: [phone] and follow the prompts.   For any non-urgent questions, you may also contact your provider using MyChart. We now offer e-Visits for anyone 18 and older to request care online for non-urgent symptoms. For details visit [email or web address].   Also download the MyChart app! Go to the app store, search "MyChart", open the app, select , and log in with your MyChart username and password.  Due to Covid, a mask is required upon entering the hospital/clinic. If you do not have a mask, one will be given to you upon arrival. For doctor visits, patients may have 1 support person aged 18 or older with them. For treatment visits, patients cannot have anyone with them due to current Covid guidelines and our immunocompromised population.  

## 2020-10-12 NOTE — Progress Notes (Signed)
Patient declined 30 minute post-observation. Vital signs stable upon discharge. 

## 2020-10-24 ENCOUNTER — Other Ambulatory Visit: Payer: Self-pay

## 2020-10-24 ENCOUNTER — Ambulatory Visit
Admission: RE | Admit: 2020-10-24 | Discharge: 2020-10-24 | Disposition: A | Discharge status: Home / Self Care | Payer: Medicaid Other | Source: Ambulatory Visit | Attending: Oncology | Admitting: Oncology

## 2020-10-24 DIAGNOSIS — C50411 Malignant neoplasm of upper-outer quadrant of right female breast: Secondary | ICD-10-CM

## 2020-10-24 DIAGNOSIS — Z1231 Encounter for screening mammogram for malignant neoplasm of breast: Secondary | ICD-10-CM | POA: Diagnosis not present

## 2020-11-01 NOTE — Progress Notes (Signed)
[facility]  [phone] Fax:([phone]) [phone]    ID: Alice [name] DOB: 1975/10/21  MR#: [id number]  YIR#:[id number]  Patient Care Team: Patient, No Pcp Per (Inactive) as PCP - General (General Practice) Mauro Kaufmann, RN as Oncology Nurse Navigator Rockwell Germany, RN as Oncology Nurse Navigator Alphonsa Overall, MD as Consulting Physician (General Surgery) Adolphus Hanf, Virgie Dad, MD as Consulting Physician (Oncology) Kyung Rudd, MD as Consulting Physician (Radiation Oncology) Larey Dresser, MD as Consulting Physician (Cardiology) Drue Flirt, MD (Family Medicine) Chauncey Cruel, MD OTHER MD:  CHIEF COMPLAINT: estrogen receptor negative, 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    She continues on maintenance trastuzumab and pertuzumab, with her final dose due today.  She tolerates this with no side effects that she is aware of.  Her most recent echocardiogram on 07/17/2020 showing an ejection fraction of >75%.  Since her last visit, she underwent left screening mammography with tomography at [facility] on 10/24/2020 showing: breast density category C; no evidence of malignancy.    REVIEW OF SYSTEMS: Lindsay feels she is getting back to normal.  Her hair has come back nice and curly.  She has a couple of thin spots at the temples which she is treating with over-the-counter lotion.  She says things are going generally well for her family.  Her husband is working.  Her 34 year old is in school.  Her 45 year old is "slow" and he helps around the house.  A detailed review of systems today was otherwise stable.   COVID 19 VACCINATION STATUS: Refuses vaccination; has not had COVID   HISTORY OF CURRENT ILLNESS: From the original intake  note:  Alice [name] presented with right breast pain with swelling and skin changes (duskiness and dimpling) and left breast pain at 6 o'clock. She underwent bilateral diagnostic mammography with tomography and bilateral breast ultrasonography at [facility] on 09/23/2019 showing: breast density category C; 5.2 cm irregular mass in right breast spanning 10-12 o'clock, with marked peau d'orange changes concerning for inflammatory component; 3.3 cm enlarged lymph node in right axilla; indeterminate 1 cm oval mass in left breast at 5 o'clock.  Accordingly on 09/30/2019 she proceeded to biopsy of the right breast area in question. The pathology from this procedure ([id number]) showed: invasive mammary carcinoma, grade 3, e-cadherin positive. Prognostic indicators significant for: estrogen receptor, 0% negative and progesterone receptor, 0% negative. Proliferation marker Ki67 at 80%. HER2 positive by immunohistochemistry (3+).  The biopsied right axillary lymph node was positive for metastatic carcinoma.  She also underwent cyst aspiration of the 1 cm mass in the left breast the same day.  The patient's subsequent history is as detailed below.   PAST MEDICAL HISTORY: Past Medical History:  Diagnosis Date  . Anemia   . Breast cancer ([place])    Right  . Hypertension     PAST SURGICAL HISTORY: Past Surgical History:  Procedure Laterality Date  . BREAST BIOPSY    . CHOLECYSTECTOMY    . IR IMAGING GUIDED PORT INSERTION  10/13/2019  . MASTECTOMY MODIFIED RADICAL Right 03/06/2020   Procedure: MASTECTOMY MODIFIED RADICAL;  Surgeon: Alphonsa Overall, MD;  Location: [facility];  Service: General;  Laterality: Right;  RNFA    FAMILY HISTORY: No family history on file.  As of 09/2019-- her father is 45 and her mother is  45. She has two brothers and one sister. There is no cancer in her family to her knowledge.    GYNECOLOGIC HISTORY:  No LMP recorded. Patient is premenopausal. Menarche: 45 years old Age at first  live birth: 45 years old GX P 2 LMP regular periods lasting 3 days; periods stopped after the first chemotherapy dose May 2021 Contraceptive: previously used, has not used for the last 2 years HRT n/a  Hysterectomy? no BSO? no   SOCIAL HISTORY: (updated March 2022))  Sruthi is originally from [place]  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 Husband [name] works as a Training and development officer.  He has some health issues as well, including diabetes.  She lives at home with [name] and their two children. Son Alice [name], age 19, has special needs.  Son Alice [name], age 34, is in high school.    ADVANCED DIRECTIVES: In the absence of any documentation to the contrary, the patient's spouse is their HCPOA.    HEALTH MAINTENANCE: Social History   Tobacco Use  . Smoking status: Never Smoker  . Smokeless tobacco: Never Used  Vaping Use  . Vaping Use: Never used  Substance Use Topics  . Alcohol use: Never  . Drug use: Never     Colonoscopy: n/a (age)  PAP: 2020  Bone density: n/a (age)   No Known Allergies  Current Outpatient Medications  Medication Sig Dispense Refill  . Multiple Vitamins-Minerals (B COMPLEX-C-E-ZINC) tablet Take 1 tablet by mouth daily. 90 tablet 4  . gabapentin (NEURONTIN) 100 MG capsule Take 1 capsule (100 mg total) by mouth 3 (three) times daily.    Marland Kitchen lisinopril (ZESTRIL) 10 MG tablet Take 1 tablet (10 mg total) by mouth daily. 90 tablet 4   No current facility-administered medications for this visit.    OBJECTIVE: Spanish speaker who appears stated age  22:   11/02/20 0827  BP: 117/70  Pulse: (!) 117  Resp: 18  Temp: (!) 97.5 F (36.4 C)  SpO2: 98%   Wt Readings from Last 3 Encounters:  11/02/20 209 lb 1.6 oz (94.8 kg)  09/21/20 207 lb 14.4 oz (94.3 kg)  08/10/20 201 lb 12.8 oz (91.5 kg)   Body mass index is 38.24 kg/m.    ECOG FS:1 - Symptomatic but completely ambulatory   Sclerae unicteric, EOMs intact Wearing a mask No  cervical or supraclavicular adenopathy Lungs no rales or rhonchi Heart regular rate and rhythm Abd soft, nontender, positive bowel sounds MSK no focal spinal tenderness, no upper extremity lymphedema Neuro: nonfocal, well oriented, appropriate affect Breasts: The right breast is status post mastectomy and radiation.  There is no evidence of local recurrence.  Left breast is benign.  Both axillae are benign.   LAB RESULTS:  CMP     Component Value Date/Time   NA 140 10/12/2020 0945   K 3.6 10/12/2020 0945   CL 104 10/12/2020 0945   CO2 26 10/12/2020 0945   GLUCOSE 118 (H) 10/12/2020 0945   BUN 11 10/12/2020 0945   CREATININE 0.81 10/12/2020 0945   CREATININE 0.79 10/06/2019 0826   CALCIUM 9.6 10/12/2020 0945   PROT 7.6 10/12/2020 0945   ALBUMIN 3.8 10/12/2020 0945   AST 28 10/12/2020 0945   AST 26 10/06/2019 0826   ALT 36 10/12/2020 0945   ALT 47 (H) 10/06/2019 0826   ALKPHOS 135 (H) 10/12/2020 0945   BILITOT 0.8 10/12/2020 0945   BILITOT 0.7 10/06/2019 0826   GFRNONAA >60 10/12/2020 0945  GFRNONAA >60 10/06/2019 0826   GFRAA >60 03/13/2020 1142   GFRAA >60 10/06/2019 0826    No results found for: TOTALPROTELP, ALBUMINELP, A1GS, A2GS, BETS, BETA2SER, GAMS, MSPIKE, SPEI  Lab Results  Component Value Date   WBC 4.8 11/02/2020   NEUTROABS 3.0 11/02/2020   HGB 11.2 (L) 11/02/2020   HCT 34.6 (L) 11/02/2020   MCV 91.3 11/02/2020   PLT 256 11/02/2020    No results found for: LABCA2  No components found for: QJJHER740  No results for input(s): INR in the last 168 hours.  No results found for: LABCA2  No results found for: CXK481  No results found for: EHU314  No results found for: HFW263  No results found for: CA2729  No components found for: HGQUANT  No results found for: CEA1 / No results found for: CEA1   No results found for: AFPTUMOR  No results found for: CHROMOGRNA  No results found for: KPAFRELGTCHN, LAMBDASER, KAPLAMBRATIO (kappa/lambda  light chains)  No results found for: HGBA, HGBA2QUANT, HGBFQUANT, HGBSQUAN (Hemoglobinopathy evaluation)   No results found for: LDH  No results found for: IRON, TIBC, IRONPCTSAT (Iron and TIBC)  No results found for: FERRITIN  Urinalysis No results found for: COLORURINE, APPEARANCEUR, LABSPEC, PHURINE, GLUCOSEU, HGBUR, BILIRUBINUR, KETONESUR, PROTEINUR, UROBILINOGEN, NITRITE, LEUKOCYTESUR   STUDIES: MM 3D SCREEN BREAST UNI LEFT  Result Date: 10/24/2020 CLINICAL DATA:  Screening. EXAM: DIGITAL SCREENING UNILATERAL LEFT MAMMOGRAM WITH CAD AND TOMOSYNTHESIS TECHNIQUE: Left screening digital craniocaudal and mediolateral oblique mammograms were obtained. Left screening digital breast tomosynthesis was performed. The images were evaluated with computer-aided detection. COMPARISON:  Previous exam(s). ACR Breast Density Category c: The breast tissue is heterogeneously dense, which may obscure small masses. FINDINGS: The patient has had a right mastectomy. There are no findings suspicious for malignancy. IMPRESSION: No mammographic evidence of malignancy. A result letter of this screening mammogram will be mailed directly to the patient. RECOMMENDATION: Screening mammogram in one year.  (Code:SM-L-37M) The American Cancer Society recommends annual MRI and mammography in patients with an estimated lifetime risk of developing breast cancer greater than 20 - 25%, or who are known or suspected to be positive for the breast cancer gene. BI-RADS CATEGORY  1: Negative. Electronically Signed   By: Valentino Saxon MD   On: 10/24/2020 13:41     ELIGIBLE FOR AVAILABLE RESEARCH PROTOCOL: no  ASSESSMENT: 45 y.o. [place] speaker status post right breast upper outer quadrant sites biopsy and right axillary lymph node biopsy 09/30/2019 for a clinical T3 N2, stage IIIA invasive ductal carcinoma, grade 3, estrogen and progesterone receptor negative, HER-2 amplified, with an MIB-1 of 80%.  (a) CT chest  abdomen and pelvis and bone scan 10/15/2019 showed no evidence of metastatic disease  (1) genetics testing 10/13/2019 through the Common Hereditary Cancers Panel offered by [facility] found no deleterious mutations in APC, ATM, AXIN2, BARD1, BMPR1A, BRCA1, BRCA2, BRIP1, CDH1, CDKN2A (p14ARF), CDKN2A (p16INK4a), CKD4, CHEK2, CTNNA1, DICER1, EPCAM (Deletion/duplication testing only), GREM1 (promoter region deletion/duplication testing only), KIT, MEN1, MLH1, MSH2, MSH3, MSH6, MUTYH, NBN, NF1, NHTL1, PALB2, PDGFRA, PMS2, POLD1, POLE, PTEN, RAD50, RAD51C, RAD51D, RNF43, SDHB, SDHC, SDHD, SMAD4, SMARCA4. STK11, TP53, TSC1, TSC2, and VHL.  The following genes were evaluated for sequence changes only: SDHA and HOXB13 c.251G>A variant only.  (2) neoadjuvant chemotherapy consisting of trastuzumab, pertuzumab, docetaxel and carboplatin every 21 days x 6 started 10/19/2019, completed 02/01/2020  (3) trastuzumab and pertuzumab to be continued to total 1 year (through May 2022).  (a)  echo 10/15/2019 shows an ejection fraction in the 65-70% range.  (b) echo 04/25/2020 shows an ejection fraction in the 60-65% range  (c) echo 07/17/2020 shows an ejection fraction in excess of 75%  (4) status post right modified radical mastectomy on 03/06/2020 showing no residual cancer in the breast or lymph nodes (ypT0 ypN0)  (a) a total of 7 right axillary lymph nodes were removed  (5) adjuvant radiation 04/13/20-05/30/20:   The right chest wall and regional nodes were treated to 50.4 Gy in 28 fractions, followed by a 10 Gy boost in 5 fractions.    PLAN: Tanairy will soon be a year out from definitive surgery for her breast cancer with no evidence of disease recurrence.  This is favorable.  She completes her anti-HER2 treatment today.  She has tolerated it well.  I do not think she needs further echocardiography.  I have sent's the surgeon group a note requesting that she have her port removed--her prior surgeon Dr. Lucia Gaskins has  retired and she will need to be reassigned  She is going to see me again in 6 months.  She knows to call for any other issue that may develop before that visit  Total encounter time 20 minutes.Sarajane Jews C. Peony Barner, MD 11/02/20 8:42 AM Medical Oncology and Hematology [facility] [place], [place] [address] Tel. [phone]    Fax. [phone]   I, Wilburn Mylar, am acting as scribe for Dr. Virgie Dad. Sharion Grieves.  I, Lurline Del MD, have reviewed the above documentation for accuracy and completeness, and I agree with the above.   *Total Encounter Time as defined by the Centers for Medicare and Medicaid Services includes, in addition to the face-to-face time of a patient visit (documented in the note above) non-face-to-face time: obtaining and reviewing outside history, ordering and reviewing medications, tests or procedures, care coordination (communications with other health care professionals or caregivers) and documentation in the medical record.

## 2020-11-02 ENCOUNTER — Inpatient Hospital Stay (HOSPITAL_BASED_OUTPATIENT_CLINIC_OR_DEPARTMENT_OTHER): Payer: Medicaid Other | Admitting: Oncology

## 2020-11-02 ENCOUNTER — Encounter: Payer: Self-pay | Admitting: *Deleted

## 2020-11-02 ENCOUNTER — Telehealth: Payer: Self-pay | Admitting: Oncology

## 2020-11-02 ENCOUNTER — Inpatient Hospital Stay: Payer: Medicaid Other

## 2020-11-02 ENCOUNTER — Other Ambulatory Visit: Payer: Self-pay

## 2020-11-02 ENCOUNTER — Other Ambulatory Visit: Payer: Self-pay | Admitting: Oncology

## 2020-11-02 ENCOUNTER — Other Ambulatory Visit: Payer: Medicaid Other

## 2020-11-02 VITALS — BP 117/70 | HR 117 | Temp 97.5°F | Resp 18 | Ht 62.0 in | Wt 209.1 lb

## 2020-11-02 DIAGNOSIS — C50411 Malignant neoplasm of upper-outer quadrant of right female breast: Secondary | ICD-10-CM

## 2020-11-02 DIAGNOSIS — Z170 Estrogen receptor positive status [ER+]: Secondary | ICD-10-CM

## 2020-11-02 DIAGNOSIS — C773 Secondary and unspecified malignant neoplasm of axilla and upper limb lymph nodes: Secondary | ICD-10-CM | POA: Diagnosis not present

## 2020-11-02 DIAGNOSIS — Z5112 Encounter for antineoplastic immunotherapy: Secondary | ICD-10-CM | POA: Diagnosis not present

## 2020-11-02 DIAGNOSIS — Z95828 Presence of other vascular implants and grafts: Secondary | ICD-10-CM

## 2020-11-02 DIAGNOSIS — Z79899 Other long term (current) drug therapy: Secondary | ICD-10-CM | POA: Diagnosis not present

## 2020-11-02 DIAGNOSIS — Z171 Estrogen receptor negative status [ER-]: Secondary | ICD-10-CM | POA: Diagnosis not present

## 2020-11-02 LAB — COMPREHENSIVE METABOLIC PANEL
ALT: 44 U/L (ref 0–44)
AST: 36 U/L (ref 15–41)
Albumin: 3.7 g/dL (ref 3.5–5.0)
Alkaline Phosphatase: 140 U/L — ABNORMAL HIGH (ref 38–126)
Anion gap: 11 (ref 5–15)
BUN: 11 mg/dL (ref 6–20)
CO2: 27 mmol/L (ref 22–32)
Calcium: 9.6 mg/dL (ref 8.9–10.3)
Chloride: 102 mmol/L (ref 98–111)
Creatinine, Ser: 0.86 mg/dL (ref 0.44–1.00)
GFR, Estimated: >60 mL/min (ref >60)
Glucose, Bld: 111 mg/dL — ABNORMAL HIGH (ref 70–99)
Potassium: 3.8 mmol/L (ref 3.5–5.1)
Sodium: 140 mmol/L (ref 135–145)
Total Bilirubin: 0.9 mg/dL (ref 0.3–1.2)
Total Protein: 7.4 g/dL (ref 6.5–8.1)

## 2020-11-02 LAB — CBC WITH DIFFERENTIAL/PLATELET
Abs Immature Granulocytes: 0 10*3/uL (ref 0.00–0.07)
Basophils Absolute: 0 10*3/uL (ref 0.0–0.1)
Basophils Relative: 1 %
Eosinophils Absolute: 0.1 10*3/uL (ref 0.0–0.5)
Eosinophils Relative: 3 %
HCT: 34.6 % — ABNORMAL LOW (ref 36.0–46.0)
Hemoglobin: 11.2 g/dL — ABNORMAL LOW (ref 12.0–15.0)
Immature Granulocytes: 0 %
Lymphocytes Relative: 27 %
Lymphs Abs: 1.3 10*3/uL (ref 0.7–4.0)
MCH: 29.6 pg (ref 26.0–34.0)
MCHC: 32.4 g/dL (ref 30.0–36.0)
MCV: 91.3 fL (ref 80.0–100.0)
Monocytes Absolute: 0.4 10*3/uL (ref 0.1–1.0)
Monocytes Relative: 8 %
Neutro Abs: 3 10*3/uL (ref 1.7–7.7)
Neutrophils Relative %: 61 %
Platelets: 256 10*3/uL (ref 150–400)
RBC: 3.79 MIL/uL — ABNORMAL LOW (ref 3.87–5.11)
RDW: 12.1 % (ref 11.5–15.5)
WBC: 4.8 10*3/uL (ref 4.0–10.5)
nRBC: 0 % (ref 0.0–0.2)

## 2020-11-02 LAB — PREGNANCY, URINE: Preg Test, Ur: NEGATIVE

## 2020-11-02 MED ORDER — SODIUM CHLORIDE 0.9% FLUSH
10.0000 mL | INTRAVENOUS | Status: DC | PRN
  Administered 2020-11-02: 10 mL
  Filled 2020-11-02: qty 10

## 2020-11-02 MED ORDER — TRASTUZUMAB-DKST CHEMO 150 MG IV SOLR
6.0000 mg/kg | Freq: Once | INTRAVENOUS | Status: AC
  Administered 2020-11-02: 525 mg via INTRAVENOUS
  Filled 2020-11-02: qty 25

## 2020-11-02 MED ORDER — DIPHENHYDRAMINE HCL 25 MG PO CAPS
ORAL_CAPSULE | ORAL | Status: AC
  Filled 2020-11-02: qty 2

## 2020-11-02 MED ORDER — LISINOPRIL 10 MG PO TABS: 10.0000 mg | ORAL_TABLET | Freq: Every day | ORAL | 4 refills | Status: AC

## 2020-11-02 MED ORDER — ACETAMINOPHEN 325 MG PO TABS
ORAL_TABLET | ORAL | Status: AC
  Filled 2020-11-02: qty 2

## 2020-11-02 MED ORDER — ACETAMINOPHEN 325 MG PO TABS
650.0000 mg | ORAL_TABLET | Freq: Once | ORAL | Status: AC
  Administered 2020-11-02: 650 mg via ORAL

## 2020-11-02 MED ORDER — STRESS FORMULA/ZINC PO TABS: 1.0000 | ORAL_TABLET | Freq: Every day | ORAL | 4 refills | Status: DC

## 2020-11-02 MED ORDER — DIPHENHYDRAMINE HCL 25 MG PO CAPS
50.0000 mg | ORAL_CAPSULE | Freq: Once | ORAL | Status: AC
  Administered 2020-11-02: 50 mg via ORAL

## 2020-11-02 MED ORDER — HEPARIN SOD (PORK) LOCK FLUSH 100 UNIT/ML IV SOLN
500.0000 [IU] | Freq: Once | INTRAVENOUS | Status: AC | PRN
  Administered 2020-11-02: 500 [IU]
  Filled 2020-11-02: qty 5

## 2020-11-02 MED ORDER — PERTUZUMAB CHEMO INJECTION 420 MG/14ML
420.0000 mg | Freq: Once | INTRAVENOUS | Status: AC
  Administered 2020-11-02: 420 mg via INTRAVENOUS
  Filled 2020-11-02: qty 14

## 2020-11-02 MED ORDER — SODIUM CHLORIDE 0.9 % IV SOLN
Freq: Once | INTRAVENOUS | Status: AC
  Filled 2020-11-02: qty 250

## 2020-11-02 NOTE — Patient Instructions (Signed)
[name]  Discharge Instructions: Thank you for choosing [facility] to provide your oncology and hematology care.   If you have a lab appointment with the [facility], please go directly to the [facility] and check in at the registration area.   Wear comfortable clothing and clothing appropriate for easy access to any Portacath or PICC line.   We strive to give you quality time with your provider. You may need to reschedule your appointment if you arrive late (15 or more minutes).  Arriving late affects you and other patients whose appointments are after yours.  Also, if you miss three or more appointments without notifying the office, you may be dismissed from the clinic at the provider's discretion.      For prescription refill requests, have your pharmacy contact our office and allow 72 hours for refills to be completed.    Today you received the following chemotherapy and/or immunotherapy agents herceptin and perjeta     To help prevent nausea and vomiting after your treatment, we encourage you to take your nausea medication as directed.  BELOW ARE SYMPTOMS THAT SHOULD BE REPORTED IMMEDIATELY: . *FEVER GREATER THAN 100.4 F (38 C) OR HIGHER . *CHILLS OR SWEATING . *NAUSEA AND VOMITING THAT IS NOT CONTROLLED WITH YOUR NAUSEA MEDICATION . *UNUSUAL SHORTNESS OF BREATH . *UNUSUAL BRUISING OR BLEEDING . *URINARY PROBLEMS (pain or burning when urinating, or frequent urination) . *BOWEL PROBLEMS (unusual diarrhea, constipation, pain near the anus) . TENDERNESS IN MOUTH AND THROAT WITH OR WITHOUT PRESENCE OF ULCERS (sore throat, sores in mouth, or a toothache) . UNUSUAL RASH, SWELLING OR PAIN  . UNUSUAL VAGINAL DISCHARGE OR ITCHING   Items with * indicate a potential emergency and should be followed up as soon as possible or go to the Emergency Department if any problems should occur.  Please show the CHEMOTHERAPY ALERT CARD or  IMMUNOTHERAPY ALERT CARD at check-in to the Emergency Department and triage nurse.  Should you have questions after your visit or need to cancel or reschedule your appointment, please contact [facility]  Dept: [phone]  and follow the prompts.  Office hours are 8:00 a.m. to 4:30 p.m. Monday - Friday. Please note that voicemails left after 4:00 p.m. may not be returned until the following business day.  We are closed weekends and major holidays. You have access to a nurse at all times for urgent questions. Please call the main number to the clinic Dept: [phone] and follow the prompts.   For any non-urgent questions, you may also contact your provider using MyChart. We now offer e-Visits for anyone 55 and older to request care online for non-urgent symptoms. For details visit [email or web address].   Also download the MyChart app! Go to the app store, search "MyChart", open the app, select , and log in with your MyChart username and password.  Due to Covid, a mask is required upon entering the hospital/clinic. If you do not have a mask, one will be given to you upon arrival. For doctor visits, patients may have 1 support person aged 24 or older with them. For treatment visits, patients cannot have anyone with them due to current Covid guidelines and our immunocompromised population.

## 2020-11-02 NOTE — Progress Notes (Signed)
Pt declined to stay for observation post perjeta infusion. VSS and pt left in stable condition, ambulatory to lobby.

## 2020-11-02 NOTE — Telephone Encounter (Signed)
Scheduled appointment per 05/26 los. Patient is aware. 

## 2020-11-07 ENCOUNTER — Other Ambulatory Visit: Payer: Self-pay | Admitting: Surgery

## 2020-11-08 DIAGNOSIS — Z419 Encounter for procedure for purposes other than remedying health state, unspecified: Secondary | ICD-10-CM | POA: Diagnosis not present

## 2020-11-13 ENCOUNTER — Telehealth: Payer: Self-pay

## 2020-11-13 NOTE — Telephone Encounter (Signed)
..   Medicaid Managed Care   Unsuccessful Outreach Note  11/13/2020 Name: Alice [name] MRN: [id number] DOB: January 29, 1976  Referred by: Patient, No Pcp Per (Inactive) Reason for referral : High Risk Managed Medicaid (Attempted to reach Ms.Jimenez today to get her scheduled for a telephone visit with the [facility] team. Message was left on her VM.)   An unsuccessful telephone outreach was attempted today. The patient was referred to the case management team for assistance with care management and care coordination.   Follow Up Plan: The care management team will reach out to the patient again over the next 5 days.   Gap

## 2020-11-23 ENCOUNTER — Telehealth: Payer: Self-pay | Admitting: Oncology

## 2020-11-23 NOTE — Telephone Encounter (Signed)
Scheduled  appointment per 06/16 sch msg. Patient is aware. 

## 2020-11-29 ENCOUNTER — Other Ambulatory Visit: Payer: Self-pay

## 2020-11-29 ENCOUNTER — Encounter (HOSPITAL_BASED_OUTPATIENT_CLINIC_OR_DEPARTMENT_OTHER): Payer: Self-pay | Admitting: Surgery

## 2020-11-29 DIAGNOSIS — D649 Anemia, unspecified: Secondary | ICD-10-CM

## 2020-11-29 DIAGNOSIS — I10 Essential (primary) hypertension: Secondary | ICD-10-CM

## 2020-11-29 HISTORY — DX: Essential (primary) hypertension: I10

## 2020-11-29 HISTORY — DX: Anemia, unspecified: D64.9

## 2020-11-29 NOTE — Progress Notes (Addendum)
Spoke w/ via phone for pre-op interview---pt with [employer] interpreters ID number [id number] Lab needs dos----  I stat             Lab results------see below COVID test -----patient states asymptomatic no test needed Arrive at -------930 am 12-04-2020 NPO after MN NO Solid Food.  water from MN until---830 am then npo Med rec completed Medications to take morning of surgery -----gabapentin, do not take bp medication dos Diabetic medication -----n/a Patient instructed no nail polish to be worn day of surgery Patient instructed to bring photo id and insurance card day of surgery Patient aware to have Driver (ride ) / caregiver   spouse julio  for 24 hours after surgery  Patient Special Instructions -----none Pre-Op special Istructions -----none Patient verbalized understanding of instructions that were given at this phone interview. Patient denies shortness of breath, chest pain, fever, cough at this phone interview.   Marthann Schiller 03-02-2020 epic Echo 03-02-2020 epic [facility] oncology dr Jana Hakim 11-02-2020 epic   Spanish female interpreter requested for [facility], email on chart

## 2020-12-03 ENCOUNTER — Other Ambulatory Visit: Payer: Self-pay | Admitting: Surgery

## 2020-12-03 NOTE — H&P (Signed)
   Alice [name]  Location: [facility] Patient #: [id number] DOB: 10-07-1975 Married / Language: Undefined / Race: Refused to Report/Unreported Female   History of Present Illness    Chief complaint: Long-term follow-up right breast cancer  This is a patient of Dr. Pollie Friar who is status post a right modified radical mastectomy after neoadjuvant therapy for breast cancer in September 2021.  She is still undergoing chemotherapy with Herceptin.  She finished radiation therapy.  She had a complete pathologic response.  She still followed closely in the cancer center.  She is being scheduled to see plastic surgery regarding potential reconstruction as well as a reduction of the left side.  She has done well and has no complaints.    PAST MEDICAL HISTORY:     Past Medical History:  Diagnosis Date   Anemia     Breast cancer ([place])      Right   Hypertension        PAST SURGICAL HISTORY:      Past Surgical History:  Procedure Laterality Date   BREAST BIOPSY       CHOLECYSTECTOMY       IR IMAGING GUIDED PORT INSERTION   10/13/2019   MASTECTOMY MODIFIED RADICAL Right 03/06/2020    Procedure: MASTECTOMY MODIFIED RADICAL;  Surgeon: Alphonsa Overall, MD;  Location: [facility];  Service: General;  Laterality: Right;  RNFA      FAMILY HISTORY: No family history on file.  As of 09/2019-- her father is 28 and her mother is 82. She has two brothers and one sister. There is no cancer in her family to her knowledge.   Medication History (Armen Glo Herring, CMA; 09/11/2020 2:34 PM) Dexamethasone  (4MG  Tablet, Oral) Active. Lisinopril  (10MG  Tablet, Oral) Active. Loratadine  (10MG  Tablet, Oral) Active. Prochlorperazine Maleate  (10MG  Tablet, Oral) Active. LORazepam  (0.5MG  Tablet, Oral) Active. Ibuprofen  (200MG  Tablet, Oral) Active. Medications Reconciled   ROS: negative as above  Physical Exam  The physical exam findings are as follows: Note:  She appears well exam.  The mastectomy  site is well-healed and I cannot palpate any masses. There is no axillary adenopathy. There is some mild fullness from fat necrosis. The left breast is normal.  Lungs clear  CV RRR  Abdomen soft, NT  Neuro grossly intect    Assessment & Plan   BREAST CANCER, STAGE 3, RIGHT (C50.911)  PORT A CATH NO LONGER NEEDED  Impression: I have reviewed her notes in the electronic medical records. I reviewed the notes of the medical oncologist. I discussed this with her through an interpreter. She is doing well and remains disease free. At this point she will continue close follow-up with the cancer center. I will remove her Port-A-Cath is no longer needed. From a long-term follow-up, I will see her back in 1 year.  Risks of surgery were discussed

## 2020-12-04 ENCOUNTER — Encounter (HOSPITAL_BASED_OUTPATIENT_CLINIC_OR_DEPARTMENT_OTHER): Payer: Self-pay | Admitting: Surgery

## 2020-12-04 ENCOUNTER — Ambulatory Visit (HOSPITAL_BASED_OUTPATIENT_CLINIC_OR_DEPARTMENT_OTHER): Payer: Medicaid Other | Admitting: Certified Registered"

## 2020-12-04 ENCOUNTER — Other Ambulatory Visit: Payer: Self-pay

## 2020-12-04 ENCOUNTER — Encounter (HOSPITAL_BASED_OUTPATIENT_CLINIC_OR_DEPARTMENT_OTHER)
Admission: RE | Disposition: A | Discharge status: Home / Self Care | Payer: Self-pay | Source: Ambulatory Visit | Attending: Surgery

## 2020-12-04 ENCOUNTER — Ambulatory Visit (HOSPITAL_BASED_OUTPATIENT_CLINIC_OR_DEPARTMENT_OTHER)
Admission: RE | Admit: 2020-12-04 | Discharge: 2020-12-04 | Disposition: A | Discharge status: Home / Self Care | Payer: Medicaid Other | Source: Ambulatory Visit | Attending: Surgery | Admitting: Surgery

## 2020-12-04 DIAGNOSIS — Z79899 Other long term (current) drug therapy: Secondary | ICD-10-CM | POA: Insufficient documentation

## 2020-12-04 DIAGNOSIS — Z170 Estrogen receptor positive status [ER+]: Secondary | ICD-10-CM | POA: Diagnosis not present

## 2020-12-04 DIAGNOSIS — Z853 Personal history of malignant neoplasm of breast: Secondary | ICD-10-CM | POA: Insufficient documentation

## 2020-12-04 DIAGNOSIS — Z923 Personal history of irradiation: Secondary | ICD-10-CM | POA: Diagnosis not present

## 2020-12-04 DIAGNOSIS — I10 Essential (primary) hypertension: Secondary | ICD-10-CM | POA: Diagnosis not present

## 2020-12-04 DIAGNOSIS — Z452 Encounter for adjustment and management of vascular access device: Secondary | ICD-10-CM | POA: Insufficient documentation

## 2020-12-04 HISTORY — PX: PORT-A-CATH REMOVAL: SHX5289

## 2020-12-04 HISTORY — DX: Personal history of antineoplastic chemotherapy: Z92.21

## 2020-12-04 LAB — POCT I-STAT, CHEM 8
BUN: 13 mg/dL (ref 6–20)
Calcium, Ion: 1.29 mmol/L (ref 1.15–1.40)
Chloride: 101 mmol/L (ref 98–111)
Creatinine, Ser: 0.8 mg/dL (ref 0.44–1.00)
Glucose, Bld: 136 mg/dL — ABNORMAL HIGH (ref 70–99)
HCT: 41 % (ref 36.0–46.0)
Hemoglobin: 13.9 g/dL (ref 12.0–15.0)
Potassium: 3.5 mmol/L (ref 3.5–5.1)
Sodium: 141 mmol/L (ref 135–145)
TCO2: 27 mmol/L (ref 22–32)

## 2020-12-04 SURGERY — REMOVAL PORT-A-CATH
Anesthesia: Monitor Anesthesia Care | Site: Chest | Laterality: Left

## 2020-12-04 MED ORDER — LIDOCAINE HCL 1 % IJ SOLN
INTRAMUSCULAR | Status: AC
  Filled 2020-12-04: qty 20

## 2020-12-04 MED ORDER — OXYCODONE HCL 5 MG PO TABS: 5.0000 mg | ORAL_TABLET | Freq: Once | ORAL | Status: DC | PRN

## 2020-12-04 MED ORDER — ONDANSETRON HCL 4 MG/2ML IJ SOLN
INTRAMUSCULAR | Status: DC | PRN
  Administered 2020-12-04: 4 mg via INTRAVENOUS

## 2020-12-04 MED ORDER — LIDOCAINE HCL (PF) 2 % IJ SOLN
INTRAMUSCULAR | Status: AC
  Filled 2020-12-04: qty 5

## 2020-12-04 MED ORDER — ACETAMINOPHEN 500 MG PO TABS
1000.0000 mg | ORAL_TABLET | ORAL | Status: AC
  Administered 2020-12-04: 1000 mg via ORAL

## 2020-12-04 MED ORDER — LACTATED RINGERS IV SOLN: INTRAVENOUS | Status: DC

## 2020-12-04 MED ORDER — ONDANSETRON HCL 4 MG/2ML IJ SOLN: 4.0000 mg | Freq: Once | INTRAMUSCULAR | Status: DC | PRN

## 2020-12-04 MED ORDER — PROPOFOL 500 MG/50ML IV EMUL
INTRAVENOUS | Status: DC | PRN
  Administered 2020-12-04: 200 ug/kg/min via INTRAVENOUS

## 2020-12-04 MED ORDER — FENTANYL CITRATE (PF) 100 MCG/2ML IJ SOLN
INTRAMUSCULAR | Status: DC | PRN
  Administered 2020-12-04: 50 ug via INTRAVENOUS
  Administered 2020-12-04 (×2): 25 ug via INTRAVENOUS

## 2020-12-04 MED ORDER — CHLORHEXIDINE GLUCONATE CLOTH 2 % EX PADS: 6.0000 | MEDICATED_PAD | Freq: Once | CUTANEOUS | Status: DC

## 2020-12-04 MED ORDER — LIDOCAINE HCL (PF) 1 % IJ SOLN
INTRAMUSCULAR | Status: DC | PRN
  Administered 2020-12-04: 10 mL

## 2020-12-04 MED ORDER — AMISULPRIDE (ANTIEMETIC) 5 MG/2ML IV SOLN: 10.0000 mg | Freq: Once | INTRAVENOUS | Status: DC | PRN

## 2020-12-04 MED ORDER — FENTANYL CITRATE (PF) 100 MCG/2ML IJ SOLN: 25.0000 ug | INTRAMUSCULAR | Status: DC | PRN

## 2020-12-04 MED ORDER — PROPOFOL 500 MG/50ML IV EMUL
INTRAVENOUS | Status: AC
  Filled 2020-12-04: qty 50

## 2020-12-04 MED ORDER — ACETAMINOPHEN 500 MG PO TABS
ORAL_TABLET | ORAL | Status: AC
  Filled 2020-12-04: qty 2

## 2020-12-04 MED ORDER — ONDANSETRON HCL 4 MG/2ML IJ SOLN
INTRAMUSCULAR | Status: AC
  Filled 2020-12-04: qty 2

## 2020-12-04 MED ORDER — CEFAZOLIN SODIUM-DEXTROSE 2-4 GM/100ML-% IV SOLN
INTRAVENOUS | Status: AC
  Filled 2020-12-04: qty 100

## 2020-12-04 MED ORDER — MIDAZOLAM HCL 2 MG/2ML IJ SOLN
INTRAMUSCULAR | Status: AC
  Filled 2020-12-04: qty 2

## 2020-12-04 MED ORDER — OXYCODONE HCL 5 MG/5ML PO SOLN: 5.0000 mg | Freq: Once | ORAL | Status: DC | PRN

## 2020-12-04 MED ORDER — MIDAZOLAM HCL 2 MG/2ML IJ SOLN
INTRAMUSCULAR | Status: DC | PRN
  Administered 2020-12-04: 2 mg via INTRAVENOUS

## 2020-12-04 MED ORDER — TRAMADOL HCL 50 MG PO TABS: 50.0000 mg | ORAL_TABLET | Freq: Four times a day (QID) | ORAL | 0 refills | Status: DC | PRN

## 2020-12-04 MED ORDER — CEFAZOLIN SODIUM-DEXTROSE 2-4 GM/100ML-% IV SOLN: 2.0000 g | INTRAVENOUS | Status: DC

## 2020-12-04 MED ORDER — FENTANYL CITRATE (PF) 100 MCG/2ML IJ SOLN
INTRAMUSCULAR | Status: AC
  Filled 2020-12-04: qty 2

## 2020-12-04 MED ORDER — CEFAZOLIN SODIUM-DEXTROSE 2-3 GM-%(50ML) IV SOLR
INTRAVENOUS | Status: DC | PRN
  Administered 2020-12-04: 2 g via INTRAVENOUS

## 2020-12-04 SURGICAL SUPPLY — 32 items
BLADE SURG 15 STRL LF DISP TIS (BLADE) ×1 IMPLANT
BLADE SURG 15 STRL SS (BLADE) ×2
CHLORAPREP W/TINT 26 (MISCELLANEOUS) ×3 IMPLANT
COVER BACK TABLE 60X90IN (DRAPES) ×3 IMPLANT
COVER MAYO STAND STRL (DRAPES) ×3 IMPLANT
COVER WAND RF STERILE (DRAPES) IMPLANT
DECANTER SPIKE VIAL GLASS SM (MISCELLANEOUS) IMPLANT
DERMABOND ADVANCED (GAUZE/BANDAGES/DRESSINGS) ×2
DERMABOND ADVANCED .7 DNX12 (GAUZE/BANDAGES/DRESSINGS) ×1 IMPLANT
DRAPE LAPAROTOMY 100X72 PEDS (DRAPES) ×3 IMPLANT
DRAPE UTILITY XL STRL (DRAPES) ×3 IMPLANT
ELECT REM PT RETURN 9FT ADLT (ELECTROSURGICAL) ×3
ELECTRODE REM PT RTRN 9FT ADLT (ELECTROSURGICAL) ×1 IMPLANT
GLOVE SURG SIGNA 7.5 PF LTX (GLOVE) ×3 IMPLANT
GLOVE SURG UNDER POLY LF SZ7 (GLOVE) ×3 IMPLANT
GOWN STRL REUS W/ TWL LRG LVL3 (GOWN DISPOSABLE) IMPLANT
GOWN STRL REUS W/ TWL XL LVL3 (GOWN DISPOSABLE) ×1 IMPLANT
GOWN STRL REUS W/TWL 2XL LVL3 (GOWN DISPOSABLE) ×3 IMPLANT
GOWN STRL REUS W/TWL LRG LVL3 (GOWN DISPOSABLE)
GOWN STRL REUS W/TWL XL LVL3 (GOWN DISPOSABLE) ×2
KIT TURNOVER CYSTO (KITS) ×3 IMPLANT
NEEDLE HYPO 25X1 1.5 SAFETY (NEEDLE) ×3 IMPLANT
NS IRRIG 1000ML POUR BTL (IV SOLUTION) IMPLANT
PACK BASIN DAY SURGERY FS (CUSTOM PROCEDURE TRAY) ×3 IMPLANT
PENCIL SMOKE EVACUATOR (MISCELLANEOUS) ×3 IMPLANT
SLEEVE SCD COMPRESS KNEE MED (STOCKING) IMPLANT
SUT MNCRL AB 4-0 PS2 18 (SUTURE) ×3 IMPLANT
SUT VIC AB 3-0 SH 27 (SUTURE) ×2
SUT VIC AB 3-0 SH 27X BRD (SUTURE) ×1 IMPLANT
SYR BULB EAR ULCER 3OZ GRN STR (SYRINGE) IMPLANT
SYR CONTROL 10ML LL (SYRINGE) ×3 IMPLANT
TOWEL OR 17X26 10 PK STRL BLUE (TOWEL DISPOSABLE) ×3 IMPLANT

## 2020-12-04 NOTE — Transfer of Care (Signed)
Immediate Anesthesia Transfer of Care Note  Patient: Alice [name]  Procedure(s) Performed: REMOVAL PORT-A-CATH (Left: Chest)  Patient Location: PACU  Anesthesia Type:MAC  Level of Consciousness: awake, alert  and oriented  Airway & Oxygen Therapy: Patient Spontanous Breathing and Patient connected to face mask oxygen  Post-op Assessment: Report given to RN and Post -op Vital signs reviewed and stable  Post vital signs: Reviewed and stable  Last Vitals:  Vitals Value Taken Time  BP    Temp    Pulse 91 12/04/20 1108  Resp 0 12/04/20 1108  SpO2 97 % 12/04/20 1108  Vitals shown include unvalidated device data.  Last Pain:  Vitals:   12/04/20 1004  TempSrc: Oral  PainSc: 0-No pain      Patients Stated Pain Goal: 4 (51/02/58 [id number])  Complications: No notable events documented.

## 2020-12-04 NOTE — Anesthesia Preprocedure Evaluation (Signed)
Anesthesia Evaluation  Patient identified by MRN, date of birth, ID band Patient awake    Reviewed: Allergy & Precautions, NPO status , Patient's Chart, lab work & pertinent test results  History of Anesthesia Complications Negative for: history of anesthetic complications  Airway Mallampati: II  TM Distance: >3 FB Neck ROM: Full    Dental   Pulmonary neg pulmonary ROS,    Pulmonary exam normal        Cardiovascular hypertension, Pt. on medications Normal cardiovascular exam     Neuro/Psych negative neurological ROS     GI/Hepatic negative GI ROS, Neg liver ROS,   Endo/Other  negative endocrine ROS  Renal/GU negative Renal ROS  negative genitourinary   Musculoskeletal negative musculoskeletal ROS (+)   Abdominal   Peds  Hematology negative hematology ROS (+)   Anesthesia Other Findings Breast cancer  Reproductive/Obstetrics                            Anesthesia Physical Anesthesia Plan  ASA: 2  Anesthesia Plan: MAC   Post-op Pain Management:    Induction: Intravenous  PONV Risk Score and Plan: 2 and Propofol infusion, TIVA and Treatment may vary due to age or medical condition  Airway Management Planned: Natural Airway, Nasal Cannula and Simple Face Mask  Additional Equipment: None  Intra-op Plan:   Post-operative Plan:   Informed Consent: I have reviewed the patients History and Physical, chart, labs and discussed the procedure including the risks, benefits and alternatives for the proposed anesthesia with the patient or authorized representative who has indicated his/her understanding and acceptance.       Plan Discussed with:   Anesthesia Plan Comments:         Anesthesia Quick Evaluation

## 2020-12-04 NOTE — Discharge Instructions (Addendum)
Ok to shower starting tomorrow  Ice pack, tylenol, ibuprofen also for pain  No vigorous activity for one week   Post Anesthesia Home Care Instructions  Activity: Get plenty of rest for the remainder of the day. A responsible individual must stay with you for 24 hours following the procedure.  For the next 24 hours, DO NOT: -Drive a car -Paediatric nurse -Drink alcoholic beverages -Take any medication unless instructed by your physician -Make any legal decisions or sign important papers.  Meals: Start with liquid foods such as gelatin or soup. Progress to regular foods as tolerated. Avoid greasy, spicy, heavy foods. If nausea and/or vomiting occur, drink only clear liquids until the nausea and/or vomiting subsides. Call your physician if vomiting continues.

## 2020-12-04 NOTE — Op Note (Signed)
REMOVAL PORT-A-CATH  Procedure Note  Alice [name] 12/04/2020   Pre-op Diagnosis: PORT NO LONGER NEEDED, HISTORY OF BREAST CANCER     Post-op Diagnosis: same  Procedure(s): REMOVAL PORT-A-CATH  Surgeon(s): Coralie Keens, MD  Anesthesia: Monitor Anesthesia Care  Staff:  Circulator: Ted Mcalpine, RN Scrub Person: Kandis Nab Circulator Assistant: McDonough-Hughes, Delene Ruffini, RN  Estimated Blood Loss: Minimal   Indications: This is a 45 year old female who has completed her treatment for breast cancer.  Her Port-A-Cath that have been placed for chemotherapy is no longer needed.  Procedure: The patient is brought to the operating room and identifies correct patient.  She is placed upon the operating table and anesthesia was induced.  Her left chest was prepped and draped in usual sterile fashion.  I anesthetized the skin of the previous Port-A-Cath incision with lidocaine.  I made incision with a scalpel and then dissected down to the port.  I easily remove the port and catheter completely intact.  I placed a figure-of-eight 3-0 Vicryl suture at the catheter site.  I then closed subtenons tissue with interrupted 3-0 Vicryl sutures and closed skin with running 4-0 Monocryl.  Dermabond was then applied.  The patient tolerated the procedure well.  All the counts were correct at the end of the procedure.  The patient was then taken in stable condition from the operating room to the recovery room.          Coralie Keens   Date: 12/04/2020  Time: 11:02 AM

## 2020-12-04 NOTE — Anesthesia Postprocedure Evaluation (Signed)
Anesthesia Post Note  Patient: Alice [name]  Procedure(s) Performed: REMOVAL PORT-A-CATH (Left: Chest)     Patient location during evaluation: PACU Level of consciousness: awake and alert Pain management: pain level controlled Vital Signs Assessment: post-procedure vital signs reviewed and stable Respiratory status: spontaneous breathing, nonlabored ventilation and respiratory function stable Cardiovascular status: blood pressure returned to baseline and stable Postop Assessment: no apparent nausea or vomiting Anesthetic complications: no   No notable events documented.  Last Vitals:  Vitals:   12/04/20 1115 12/04/20 1130  BP: 135/87 (!) 142/84  Pulse: 93 79  Resp: 16 16  Temp:  36.4 C  SpO2: 94% 95%    Last Pain:  Vitals:   12/04/20 1130  TempSrc:   PainSc: 0-No pain                 Lidia Collum

## 2020-12-04 NOTE — Interval H&P Note (Signed)
History and Physical Interval Note:no change in H and P  12/04/2020 9:46 AM  Alice [name]  has presented today for surgery, with the diagnosis of PORT NO LONGER NEEDED HISTORY OF BREAST CANCER.  The various methods of treatment have been discussed with the patient and family. After consideration of risks, benefits and other options for treatment, the patient has consented to  Procedure(s): REMOVAL PORT-A-CATH (N/A) as a surgical intervention.  The patient's history has been reviewed, patient examined, no change in status, stable for surgery.  I have reviewed the patient's chart and labs.  Questions were answered to the patient's satisfaction.     Coralie Keens

## 2020-12-05 ENCOUNTER — Encounter (HOSPITAL_BASED_OUTPATIENT_CLINIC_OR_DEPARTMENT_OTHER): Payer: Self-pay | Admitting: Surgery

## 2020-12-08 DIAGNOSIS — Z419 Encounter for procedure for purposes other than remedying health state, unspecified: Secondary | ICD-10-CM | POA: Diagnosis not present

## 2021-01-08 DIAGNOSIS — Z419 Encounter for procedure for purposes other than remedying health state, unspecified: Secondary | ICD-10-CM | POA: Diagnosis not present

## 2021-02-08 DIAGNOSIS — Z419 Encounter for procedure for purposes other than remedying health state, unspecified: Secondary | ICD-10-CM | POA: Diagnosis not present

## 2021-02-15 DIAGNOSIS — Z124 Encounter for screening for malignant neoplasm of cervix: Secondary | ICD-10-CM | POA: Diagnosis not present

## 2021-02-15 DIAGNOSIS — Z0000 Encounter for general adult medical examination without abnormal findings: Secondary | ICD-10-CM | POA: Diagnosis not present

## 2021-03-10 DIAGNOSIS — Z419 Encounter for procedure for purposes other than remedying health state, unspecified: Secondary | ICD-10-CM | POA: Diagnosis not present

## 2021-03-28 ENCOUNTER — Ambulatory Visit (INDEPENDENT_AMBULATORY_CARE_PROVIDER_SITE_OTHER): Payer: Medicaid Other | Admitting: Plastic Surgery

## 2021-03-28 ENCOUNTER — Encounter: Payer: Self-pay | Admitting: Plastic Surgery

## 2021-03-28 ENCOUNTER — Other Ambulatory Visit: Payer: Self-pay

## 2021-03-28 VITALS — BP 124/83 | HR 100 | Ht 62.0 in | Wt 214.4 lb

## 2021-03-28 DIAGNOSIS — C50411 Malignant neoplasm of upper-outer quadrant of right female breast: Secondary | ICD-10-CM | POA: Diagnosis not present

## 2021-03-28 DIAGNOSIS — Z170 Estrogen receptor positive status [ER+]: Secondary | ICD-10-CM

## 2021-03-28 NOTE — Progress Notes (Signed)
Referring Provider Drue Flirt, MD [phone],  [place] [id number]   CC:  Chief Complaint  Patient presents with   Advice Only      Alice [name] is an 45 y.o. female.  HPI: Patient presents to discuss right breast reconstruction and symmetry procedures.  She was diagnosed with right-sided breast cancer and underwent neoadjuvant chemotherapy.  This was followed by modified radical mastectomy and adjuvant chest wall radiation.  She has finished all of her adjuvant treatments.  She would like to discuss reconstruction.  She denies any trouble with lymphedema on the right side.  No Known Allergies  Outpatient Encounter Medications as of 03/28/2021  Medication Sig   lisinopril (ZESTRIL) 10 MG tablet Take 1 tablet (10 mg total) by mouth daily.   [DISCONTINUED] VITAMIN D PO Take by mouth daily.   [DISCONTINUED] gabapentin (NEURONTIN) 100 MG capsule Take 1 capsule (100 mg total) by mouth 3 (three) times daily.   [DISCONTINUED] Multiple Vitamins-Minerals (B COMPLEX-C-E-ZINC) tablet Take 1 tablet by mouth daily.   [DISCONTINUED] traMADol (ULTRAM) 50 MG tablet Take 1 tablet (50 mg total) by mouth every 6 (six) hours as needed.   No facility-administered encounter medications on file as of 03/28/2021.     Past Medical History:  Diagnosis Date   Anemia 11/29/2020   Breast cancer ([place]) 2021   Right   History of chemotherapy    FINISHED CHEMO MAY 2022   Hypertension 11/29/2020    Past Surgical History:  Procedure Laterality Date   BREAST BIOPSY     CHOLECYSTECTOMY     more than 10 yrs ago per pt on 11-29-2020, laparoscopic   IR IMAGING GUIDED PORT INSERTION  10/13/2019   MASTECTOMY MODIFIED RADICAL Right 03/06/2020   Procedure: MASTECTOMY MODIFIED RADICAL;  Surgeon: Alphonsa Overall, MD;  Location: [facility];  Service: General;  Laterality: Right;  RNFA   PORT-A-CATH REMOVAL Left 12/04/2020   Procedure: REMOVAL PORT-A-CATH;  Surgeon: Coralie Keens, MD;  Location:  [facility];  Service: General;  Laterality: Left;    No family history on file.  Social History   Social History Narrative   Not on file     Review of Systems General: Denies fevers, chills, weight loss CV: Denies chest pain, shortness of breath, palpitations  Physical Exam Vitals with BMI 03/28/2021 12/04/2020 12/04/2020  Height 5\' 2"  - -  Weight 214 lbs 6 oz - -  BMI 02.6 - -  Systolic 378 588 502  Diastolic 83 96 84  Pulse 774 78 79    General:  No acute distress,  Alert and oriented, Non-Toxic, Normal speech and affect Breast: Evidence of right sided mastectomy with skin fibrosis consistent with radiation.  Left side is large and ptotic with no obvious scars.  No obvious scars on her back.  No obvious swelling in the right arm  Assessment/Plan I long discussion with the patient about her options.  Given the radiation changes on the right side she would need a latissimus flap and expander placement.  After expansion that would be switched out to an implant and I could do reduction on the left side.  I explained exact symmetry would not be possible particularly given the size of the left side but that the overall shape and symmetry could be improved dramatically.  I went over the specifics of the latissimus flap and that it would include removing skin and muscle from her back and transferring it to the right chest.  She  understands this would be a staged reconstruction with expander placement.  We discussed risks of the procedure that include bleeding, infection, damage to surrounding structures and need for additional procedures.  We discussed the potential for wound healing complications or infections that would cause loss of the implant on the right side.  We discussed the potential for flap loss.  She has had a modified radical mastectomy so I would check the perfusion of the latissimus flap intraoperatively but expect that it would be fine.  She is fully understanding  wants to proceed.  Cindra Presume 03/28/2021, 2:06 PM

## 2021-04-10 DIAGNOSIS — Z419 Encounter for procedure for purposes other than remedying health state, unspecified: Secondary | ICD-10-CM | POA: Diagnosis not present

## 2021-05-06 NOTE — Progress Notes (Incomplete)
Alice [name]  [phone] [phone]    ID: Alice [name] DOB: 1976/02/25  MR#: [id number]  FBP#:[id number]  Patient Care Team: Drue Flirt, MD as PCP - General (Family Medicine) Mauro Kaufmann, RN as Oncology Nurse Navigator Rockwell Germany, RN as Oncology Nurse Navigator Alphonsa Overall, MD as Consulting Physician (General Surgery) Magrinat, Virgie Dad, MD as Consulting Physician (Oncology) Kyung Rudd, MD as Consulting Physician (Radiation Oncology) Larey Dresser, MD as Consulting Physician (Cardiology) Drue Flirt, MD (Family Medicine) Aurea Graff OTHER MD:  CHIEF COMPLAINT: estrogen receptor negative, 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 She is now under observation.  Since her last visit, she has not undergone any additional studies. She did have her port removed on 12/04/2020.  She is scheduled for right breast reconstruction on 05/29/2021 under Dr. Claudia Desanctis.   REVIEW OF SYSTEMS: [place]    COVID 19 VACCINATION STATUS: Refuses vaccination; has not had COVID   HISTORY OF CURRENT ILLNESS: From the original intake note:  Alice [name] presented with right breast pain with swelling and skin changes (duskiness and dimpling) and left breast pain at 6 o'clock. She underwent bilateral diagnostic mammography with tomography and bilateral breast ultrasonography at [facility] on 09/23/2019 showing: breast density category C; 5.2 cm irregular mass in right breast spanning 10-12 o'clock, with marked peau d'orange changes concerning for inflammatory component; 3.3 cm enlarged lymph node in right axilla; indeterminate 1 cm oval mass in left breast at 5 o'clock.  Accordingly on 09/30/2019 she proceeded to biopsy of the right breast area in question. The pathology from this  procedure ([id number]) showed: invasive mammary carcinoma, grade 3, e-cadherin positive. Prognostic indicators significant for: estrogen receptor, 0% negative and progesterone receptor, 0% negative. Proliferation marker Ki67 at 80%. HER2 positive by immunohistochemistry (3+).  The biopsied right axillary lymph node was positive for metastatic carcinoma.  She also underwent cyst aspiration of the 1 cm mass in the left breast the same day.  The patient's subsequent history is as detailed below.   PAST MEDICAL HISTORY: Past Medical History:  Diagnosis Date   Anemia 11/29/2020   Breast cancer ([place]) 2021   Right   History of chemotherapy    FINISHED CHEMO MAY 2022   Hypertension 11/29/2020    PAST SURGICAL HISTORY: Past Surgical History:  Procedure Laterality Date   BREAST BIOPSY     CHOLECYSTECTOMY     more than 10 yrs ago per pt on 11-29-2020, laparoscopic   IR IMAGING GUIDED PORT INSERTION  10/13/2019   MASTECTOMY MODIFIED RADICAL Right 03/06/2020   Procedure: MASTECTOMY MODIFIED RADICAL;  Surgeon: Alphonsa Overall, MD;  Location: [facility];  Service: General;  Laterality: Right;  RNFA   PORT-A-CATH REMOVAL Left 12/04/2020   Procedure: REMOVAL PORT-A-CATH;  Surgeon: Coralie Keens, MD;  Location: [facility];  Service: General;  Laterality: Left;    FAMILY HISTORY: No family history on file.  As of 09/2019-- her father is 57 and her mother is 60. She has two brothers and one sister. There is no cancer in her family to her knowledge.    GYNECOLOGIC HISTORY:  Patient's last menstrual period was 09/22/2019. Menarche: 45 years old Age at first live birth: 45 years old GX P 2 LMP regular periods lasting 3 days; periods stopped after the first chemotherapy dose May 2021 Contraceptive: previously used, has  not used for the last 2 years HRT n/a  Hysterectomy? no BSO? no   SOCIAL HISTORY: (updated March 2022))  Alice [name] is originally from [place]  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 Husband [name] works as a Training and development officer.  He has some health issues as well, including diabetes.  She lives at home with [name] and their two children. Son Alice [name], age 35, has special needs.  Son Alice [name], age 35, is in high school.    ADVANCED DIRECTIVES: In the absence of any documentation to the contrary, the patient's spouse is their HCPOA.    HEALTH MAINTENANCE: Social History   Tobacco Use   Smoking status: Never   Smokeless tobacco: Never  Vaping Use   Vaping Use: Never used  Substance Use Topics   Alcohol use: Never   Drug use: Never     Colonoscopy: n/a (age)  PAP: 2020  Bone density: n/a (age)   No Known Allergies  Current Outpatient Medications  Medication Sig Dispense Refill   lisinopril (ZESTRIL) 10 MG tablet Take 1 tablet (10 mg total) by mouth daily. 90 tablet 4   No current facility-administered medications for this visit.    OBJECTIVE: Spanish speaker who appears stated age  There were no vitals filed for this visit.  Wt Readings from Last 3 Encounters:  03/28/21 214 lb 6.4 oz (97.3 kg)  12/04/20 207 lb (93.9 kg)  11/02/20 209 lb 1.6 oz (94.8 kg)   There is no height or weight on file to calculate BMI.    ECOG FS:1 - Symptomatic but completely ambulatory   Sclerae unicteric, EOMs intact Wearing a mask No cervical or supraclavicular adenopathy Lungs no rales or rhonchi Heart regular rate and rhythm Abd soft, nontender, positive bowel sounds MSK no focal spinal tenderness, no upper extremity lymphedema Neuro: nonfocal, well oriented, appropriate affect Breasts:    {Sclerae unicteric, EOMs intact Wearing a mask No cervical or supraclavicular adenopathy Lungs no rales or rhonchi Heart regular rate and rhythm Abd soft, nontender, positive bowel sounds MSK no focal spinal tenderness, no upper extremity lymphedema Neuro: nonfocal, well oriented, appropriate affect Breasts: The right breast is  status post mastectomy and radiation.  There is no evidence of local recurrence.  Left breast is benign.  Both axillae are benign.}   LAB RESULTS:  CMP     Component Value Date/Time   NA 141 12/04/2020 1007   K 3.5 12/04/2020 1007   CL 101 12/04/2020 1007   CO2 27 11/02/2020 0809   GLUCOSE 136 (H) 12/04/2020 1007   BUN 13 12/04/2020 1007   CREATININE 0.80 12/04/2020 1007   CREATININE 0.79 10/06/2019 0826   CALCIUM 9.6 11/02/2020 0809   PROT 7.4 11/02/2020 0809   ALBUMIN 3.7 11/02/2020 0809   AST 36 11/02/2020 0809   AST 26 10/06/2019 0826   ALT 44 11/02/2020 0809   ALT 47 (H) 10/06/2019 0826   ALKPHOS 140 (H) 11/02/2020 0809   BILITOT 0.9 11/02/2020 0809   BILITOT 0.7 10/06/2019 0826   GFRNONAA >60 11/02/2020 0809   GFRNONAA >60 10/06/2019 0826   GFRAA >60 03/13/2020 1142   GFRAA >60 10/06/2019 0826    No results found for: Ronnald Ramp, A1GS, A2GS, BETS, BETA2SER, GAMS, MSPIKE, SPEI  Lab Results  Component Value Date   WBC 4.8 11/02/2020   NEUTROABS 3.0 11/02/2020   HGB 13.9 12/04/2020   HCT 41.0 12/04/2020   MCV 91.3 11/02/2020   PLT 256 11/02/2020  No results found for: LABCA2  No components found for: FUOEOV876  No results for input(s): INR in the last 168 hours.  No results found for: LABCA2  No results found for: XKW276  No results found for: OAT655  No results found for: PMD206  No results found for: CA2729  No components found for: HGQUANT  No results found for: CEA1 / No results found for: CEA1   No results found for: AFPTUMOR  No results found for: CHROMOGRNA  No results found for: KPAFRELGTCHN, LAMBDASER, KAPLAMBRATIO (kappa/lambda light chains)  No results found for: HGBA, HGBA2QUANT, HGBFQUANT, HGBSQUAN (Hemoglobinopathy evaluation)   No results found for: LDH  No results found for: IRON, TIBC, IRONPCTSAT (Iron and TIBC)  No results found for: FERRITIN  Urinalysis No results found for: COLORURINE,  APPEARANCEUR, LABSPEC, PHURINE, GLUCOSEU, HGBUR, BILIRUBINUR, KETONESUR, PROTEINUR, UROBILINOGEN, NITRITE, LEUKOCYTESUR   STUDIES: No results found.   ELIGIBLE FOR AVAILABLE RESEARCH PROTOCOL: no  ASSESSMENT: 45 y.o. [place] Spanish speaker status post right breast upper outer quadrant sites biopsy and right axillary lymph node biopsy 09/30/2019 for a clinical T3 N2, stage IIIA invasive ductal carcinoma, grade 3, estrogen and progesterone receptor negative, HER-2 amplified, with an MIB-1 of 80%.  (a) CT chest abdomen and pelvis and bone scan 10/15/2019 showed no evidence of metastatic disease  (1) genetics testing 10/13/2019 through the Common Hereditary Cancers Panel offered by [facility] found no deleterious mutations in APC, ATM, AXIN2, BARD1, BMPR1A, BRCA1, BRCA2, BRIP1, CDH1, CDKN2A (p14ARF), CDKN2A (p16INK4a), CKD4, CHEK2, CTNNA1, DICER1, EPCAM (Deletion/duplication testing only), GREM1 (promoter region deletion/duplication testing only), KIT, MEN1, MLH1, MSH2, MSH3, MSH6, MUTYH, NBN, NF1, NHTL1, PALB2, PDGFRA, PMS2, POLD1, POLE, PTEN, RAD50, RAD51C, RAD51D, RNF43, SDHB, SDHC, SDHD, SMAD4, SMARCA4. STK11, TP53, TSC1, TSC2, and VHL.  The following genes were evaluated for sequence changes only: SDHA and HOXB13 c.251G>A variant only.  (2) neoadjuvant chemotherapy consisting of trastuzumab, pertuzumab, docetaxel and carboplatin every 21 days x 6 started 10/19/2019, completed 02/01/2020  (3) trastuzumab and pertuzumab to be continued to total 1 year (through May 2022).  (a) echo 10/15/2019 shows an ejection fraction in the 65-70% range.  (b) echo 04/25/2020 shows an ejection fraction in the 60-65% range  (c) echo 07/17/2020 shows an ejection fraction in excess of 75%  (4) status post right modified radical mastectomy on 03/06/2020 showing no residual cancer in the breast or lymph nodes (ypT0 ypN0)  (a) a total of 7 right axillary lymph nodes were removed  (5) adjuvant radiation  04/13/20-05/30/20:   The right chest wall and regional nodes were treated to 50.4 Gy in 28 fractions, followed by a 10 Gy boost in 5 fractions.    PLAN: Erline will soon be a year out from definitive surgery for her breast cancer with no evidence of disease recurrence.  This is favorable.  She completes her anti-HER2 treatment today.  She has tolerated it well.  I do not think she needs further echocardiography.  I have sent's the surgeon group a note requesting that she have her port removed--her prior surgeon Dr. Ezzard Standing has retired and she will need to be reassigned  She is going to see me again in 6 months.  She knows to call for any other issue that may develop before that visit  Total encounter time 20 minutes.Raymond Gurney C. Magrinat, MD 05/06/21 6:43 AM Medical Oncology and Hematology [facility] [address] [place], [place] [address] Tel. [phone]    Fax. [phone]  I, Wilburn Mylar, am acting as scribe for Dr. Sarajane Jews C. Magrinat.  I, Lurline Del MD, have reviewed the above documentation for accuracy and completeness, and I agree with the above.   *Total Encounter Time as defined by the Centers for Medicare and Medicaid Services includes, in addition to the face-to-face time of a patient visit (documented in the note above) non-face-to-face time: obtaining and reviewing outside history, ordering and reviewing medications, tests or procedures, care coordination (communications with other health care professionals or caregivers) and documentation in the medical record.

## 2021-05-07 ENCOUNTER — Ambulatory Visit: Payer: Medicaid Other | Admitting: Oncology

## 2021-05-07 ENCOUNTER — Other Ambulatory Visit: Payer: Medicaid Other

## 2021-05-08 ENCOUNTER — Ambulatory Visit: Payer: Medicaid Other | Admitting: Oncology

## 2021-05-08 ENCOUNTER — Other Ambulatory Visit: Payer: Medicaid Other

## 2021-05-09 ENCOUNTER — Other Ambulatory Visit: Payer: Self-pay

## 2021-05-09 ENCOUNTER — Encounter: Payer: Self-pay | Admitting: Surgical

## 2021-05-09 ENCOUNTER — Ambulatory Visit (INDEPENDENT_AMBULATORY_CARE_PROVIDER_SITE_OTHER): Payer: Medicaid Other | Admitting: Surgical

## 2021-05-09 VITALS — BP 138/94 | HR 88 | Ht 66.0 in | Wt 213.4 lb

## 2021-05-09 DIAGNOSIS — Z170 Estrogen receptor positive status [ER+]: Secondary | ICD-10-CM

## 2021-05-09 DIAGNOSIS — C50411 Malignant neoplasm of upper-outer quadrant of right female breast: Secondary | ICD-10-CM

## 2021-05-09 MED ORDER — ONDANSETRON HCL 4 MG PO TABS: 4.0000 mg | ORAL_TABLET | Freq: Three times a day (TID) | ORAL | 0 refills | Status: DC | PRN

## 2021-05-09 MED ORDER — SULFAMETHOXAZOLE-TRIMETHOPRIM 800-160 MG PO TABS: 1.0000 | ORAL_TABLET | Freq: Two times a day (BID) | ORAL | 0 refills | Status: AC

## 2021-05-09 MED ORDER — HYDROCODONE-ACETAMINOPHEN 5-325 MG PO TABS: 1.0000 | ORAL_TABLET | Freq: Four times a day (QID) | ORAL | 0 refills | Status: AC | PRN

## 2021-05-09 NOTE — H&P (View-Only) (Signed)
Patient ID: Alice [name], female    DOB: [birth year], 44 y.o.   MRN: [id number]  Chief Complaint  Patient presents with   Pre-op Exam      ICD-10-CM   1. Malignant neoplasm of upper-outer quadrant of right breast in female, estrogen receptor positive ([place])  C50.411    Z17.0        History of Present Illness: Alice [name] is a 45 y.o.  female  with a history of right breast cancer and subsequent right mastectomy.  She presents for preoperative evaluation for upcoming procedure, right breast reconstruction with latissimus flap and tissue expander placement and application of acellular dermal matrix, scheduled for 05/29/2021 with Dr. Claudia Desanctis. Patient presented today with Spanish interpreter.  The patient has not had problems with anesthesia. No history of DVT/PE.  No family history of DVT/PE.  No family or personal history of bleeding or clotting disorders.  Patient is not currently taking any blood thinners.  No history of CVA/MI.   Summary of Previous Visit: Diagnosed with right-sided breast cancer and underwent neoadjuvant chemotherapy followed by modified radical mastectomy and adjuvant chest wall radiation.  She has finished all of her adjuvant treatments.  PMH Significant for: Hypertension, radiation to right breast  Patient had her final dose of dual HER2 therapy with trastuzumab and pertuzumab on 11/02/2020.  Patient reports she is doing well today, reports that she is prepared for surgery.  She is not having any issues or changes in her health currently.  She reports that she understands all of the risks and understands the scheduled procedure.  She denies any dizziness or weakness or fevers or chills or chest pain or shortness of breath.  She is very pleasant.  Past Medical History: Allergies: No Known Allergies  Current Medications:  Current Outpatient Medications:    lisinopril (ZESTRIL) 10 MG tablet, Take 1 tablet (10 mg total) by mouth daily., Disp: 90 tablet, Rfl:  4  Past Medical Problems: Past Medical History:  Diagnosis Date   Anemia 11/29/2020   Breast cancer ([place]) 2021   Right   History of chemotherapy    FINISHED CHEMO MAY 2022   Hypertension 11/29/2020    Past Surgical History: Past Surgical History:  Procedure Laterality Date   BREAST BIOPSY     CHOLECYSTECTOMY     more than 10 yrs ago per pt on 11-29-2020, laparoscopic   IR IMAGING GUIDED PORT INSERTION  10/13/2019   MASTECTOMY MODIFIED RADICAL Right 03/06/2020   Procedure: MASTECTOMY MODIFIED RADICAL;  Surgeon: Alphonsa Overall, MD;  Location: [facility];  Service: General;  Laterality: Right;  RNFA   PORT-A-CATH REMOVAL Left 12/04/2020   Procedure: REMOVAL PORT-A-CATH;  Surgeon: Coralie Keens, MD;  Location: [facility];  Service: General;  Laterality: Left;    Social History: Social History   Socioeconomic History   Marital status: Married    Spouse name: Not on file   Number of children: Not on file   Years of education: Not on file   Highest education level: Not on file  Occupational History   Not on file  Tobacco Use   Smoking status: Never   Smokeless tobacco: Never  Vaping Use   Vaping Use: Never used  Substance and Sexual Activity   Alcohol use: Never   Drug use: Never   Sexual activity: Not on file  Other Topics Concern   Not on file  Social History Narrative   Not on file   Social Determinants of Health  Financial Resource Strain: Not on file  Food Insecurity: Not on file  Transportation Needs: Not on file  Physical Activity: Not on file  Stress: Not on file  Social Connections: Not on file  Intimate Partner Violence: Not on file    Family History: No family history on file.  Review of Systems: Review of Systems  Constitutional: Negative.   Respiratory: Negative.    Cardiovascular: Negative.   Gastrointestinal: Negative.   Neurological: Negative.    Physical Exam: Vital Signs BP (!) 138/94 (BP Location: Left Arm, Patient  Position: Sitting, Cuff Size: Large)    Pulse 88    Ht 5\' 6"  (1.676 m)    Wt 213 lb 6.4 oz (96.8 kg)    LMP 09/22/2019    SpO2 98%    BMI 34.44 kg/m   Physical Exam  Constitutional:      General: Not in acute distress.    Appearance: Normal appearance. Not ill-appearing.  HENT:     Head: Normocephalic and atraumatic.  Eyes:     Pupils: Pupils are equal, round Neck:     Musculoskeletal: Normal range of motion.  Cardiovascular:     Rate and Rhythm: Normal rate    Pulses: Normal pulses.  Pulmonary:     Effort: Pulmonary effort is normal. No respiratory distress.  Abdominal:     General: Abdomen is flat. There is no distension.  Musculoskeletal: Normal range of motion.  Skin:    General: Skin is warm and dry.     Findings: No erythema or rash.  Neurological:     General: No focal deficit present.     Mental Status: Alert and oriented to person, place, and time. Mental status is at baseline.     Motor: No weakness.  Psychiatric:        Mood and Affect: Mood normal.        Behavior: Behavior normal.    Assessment/Plan: The patient is scheduled for right latissimus myocutaneous muscle flap with placement of right breast tissue expander and possible acellular dermal matrix with Dr. Claudia Desanctis.  Risks, benefits, and alternatives of procedure discussed, questions answered and consent obtained.    Smoking Status: Non-smoker; Counseling Given?  N/A  Caprini Score: 6, high; Risk Factors include: Age, BMI > 25, history of right breast cancer and length of planned surgery. Recommendation for mechanical prophylaxis. Encourage early ambulation.   Pictures obtained: @consult   Post-op Rx sent to pharmacy: Norco, Zofran and Bactrim  Patient was provided with the breast reconstruction with tissue expander and General Surgical Risk consent document and Pain Medication Agreement prior to their appointment.  They had adequate time to read through the risk consent documents and Pain Medication  Agreement. We also discussed them in person together during this preop appointment. All of their questions were answered to their satisfaction.  Recommended calling if they have any further questions.  Risk consent form and Pain Medication Agreement to be scanned into patient's chart.  We discussed the planned procedure in detail, all of her questions were answered to her content.  We discussed the incisions and their locations.  Appreciate assistance from interpreter services.  Patient is aware of the risks associated with latissimus myocutaneous muscle flap.  We discussed the use of drains postoperatively and that they may remain for a few weeks after surgery.  We discussed remaining in the hospital overnight for at least 1 more night for observation.   Electronically signed by: Carola Rhine Aviance [place], PA-C 05/09/2021 1:45 PM

## 2021-05-09 NOTE — Progress Notes (Signed)
Patient ID: Alice [name], female    DOB: August 26, 1975, 45 y.o.   MRN: [id number]  Chief Complaint  Patient presents with   Pre-op Exam      ICD-10-CM   1. Malignant neoplasm of upper-outer quadrant of right breast in female, estrogen receptor positive ([place])  C50.411    Z17.0        History of Present Illness: Alice [name] is a 45 y.o.  female  with a history of right breast cancer and subsequent right mastectomy.  She presents for preoperative evaluation for upcoming procedure, right breast reconstruction with latissimus flap and tissue expander placement and application of acellular dermal matrix, scheduled for 05/29/2021 with Dr. Claudia Desanctis. Patient presented today with Spanish interpreter.  The patient has not had problems with anesthesia. No history of DVT/PE.  No family history of DVT/PE.  No family or personal history of bleeding or clotting disorders.  Patient is not currently taking any blood thinners.  No history of CVA/MI.   Summary of Previous Visit: Diagnosed with right-sided breast cancer and underwent neoadjuvant chemotherapy followed by modified radical mastectomy and adjuvant chest wall radiation.  She has finished all of her adjuvant treatments.  PMH Significant for: Hypertension, radiation to right breast  Patient had her final dose of dual HER2 therapy with trastuzumab and pertuzumab on 11/02/2020.  Patient reports she is doing well today, reports that she is prepared for surgery.  She is not having any issues or changes in her health currently.  She reports that she understands all of the risks and understands the scheduled procedure.  She denies any dizziness or weakness or fevers or chills or chest pain or shortness of breath.  She is very pleasant.  Past Medical History: Allergies: No Known Allergies  Current Medications:  Current Outpatient Medications:    lisinopril (ZESTRIL) 10 MG tablet, Take 1 tablet (10 mg total) by mouth daily., Disp: 90 tablet, Rfl:  4  Past Medical Problems: Past Medical History:  Diagnosis Date   Anemia 11/29/2020   Breast cancer ([place]) 2021   Right   History of chemotherapy    FINISHED CHEMO MAY 2022   Hypertension 11/29/2020    Past Surgical History: Past Surgical History:  Procedure Laterality Date   BREAST BIOPSY     CHOLECYSTECTOMY     more than 10 yrs ago per pt on 11-29-2020, laparoscopic   IR IMAGING GUIDED PORT INSERTION  10/13/2019   MASTECTOMY MODIFIED RADICAL Right 03/06/2020   Procedure: MASTECTOMY MODIFIED RADICAL;  Surgeon: Alphonsa Overall, MD;  Location: [facility];  Service: General;  Laterality: Right;  RNFA   PORT-A-CATH REMOVAL Left 12/04/2020   Procedure: REMOVAL PORT-A-CATH;  Surgeon: Coralie Keens, MD;  Location: [facility];  Service: General;  Laterality: Left;    Social History: Social History   Socioeconomic History   Marital status: Married    Spouse name: Not on file   Number of children: Not on file   Years of education: Not on file   Highest education level: Not on file  Occupational History   Not on file  Tobacco Use   Smoking status: Never   Smokeless tobacco: Never  Vaping Use   Vaping Use: Never used  Substance and Sexual Activity   Alcohol use: Never   Drug use: Never   Sexual activity: Not on file  Other Topics Concern   Not on file  Social History Narrative   Not on file   Social Determinants of Health  Financial Resource Strain: Not on file  Food Insecurity: Not on file  Transportation Needs: Not on file  Physical Activity: Not on file  Stress: Not on file  Social Connections: Not on file  Intimate Partner Violence: Not on file    Family History: No family history on file.  Review of Systems: Review of Systems  Constitutional: Negative.   Respiratory: Negative.    Cardiovascular: Negative.   Gastrointestinal: Negative.   Neurological: Negative.    Physical Exam: Vital Signs BP (!) 138/94 (BP Location: Left Arm, Patient  Position: Sitting, Cuff Size: Large)   Pulse 88   Ht 5\' 6"  (1.676 m)   Wt 213 lb 6.4 oz (96.8 kg)   LMP 09/22/2019   SpO2 98%   BMI 34.44 kg/m   Physical Exam  Constitutional:      General: Not in acute distress.    Appearance: Normal appearance. Not ill-appearing.  HENT:     Head: Normocephalic and atraumatic.  Eyes:     Pupils: Pupils are equal, round Neck:     Musculoskeletal: Normal range of motion.  Cardiovascular:     Rate and Rhythm: Normal rate    Pulses: Normal pulses.  Pulmonary:     Effort: Pulmonary effort is normal. No respiratory distress.  Abdominal:     General: Abdomen is flat. There is no distension.  Musculoskeletal: Normal range of motion.  Skin:    General: Skin is warm and dry.     Findings: No erythema or rash.  Neurological:     General: No focal deficit present.     Mental Status: Alert and oriented to person, place, and time. Mental status is at baseline.     Motor: No weakness.  Psychiatric:        Mood and Affect: Mood normal.        Behavior: Behavior normal.    Assessment/Plan: The patient is scheduled for right latissimus myocutaneous muscle flap with placement of right breast tissue expander and possible acellular dermal matrix with Dr. Claudia Desanctis.  Risks, benefits, and alternatives of procedure discussed, questions answered and consent obtained.    Smoking Status: Non-smoker; Counseling Given?  N/A  Caprini Score: 6, high; Risk Factors include: Age, BMI > 25, history of right breast cancer and length of planned surgery. Recommendation for mechanical prophylaxis. Encourage early ambulation.   Pictures obtained: @consult   Post-op Rx sent to pharmacy: Norco, Zofran and Bactrim  Patient was provided with the breast reconstruction with tissue expander and General Surgical Risk consent document and Pain Medication Agreement prior to their appointment.  They had adequate time to read through the risk consent documents and Pain Medication  Agreement. We also discussed them in person together during this preop appointment. All of their questions were answered to their satisfaction.  Recommended calling if they have any further questions.  Risk consent form and Pain Medication Agreement to be scanned into patient's chart.  We discussed the planned procedure in detail, all of her questions were answered to her content.  We discussed the incisions and their locations.  Appreciate assistance from interpreter services.  Patient is aware of the risks associated with latissimus myocutaneous muscle flap.  We discussed the use of drains postoperatively and that they may remain for a few weeks after surgery.  We discussed remaining in the hospital overnight for at least 1 more night for observation.   Electronically signed by: Carola Rhine Daveda Larock, PA-C 05/09/2021 1:45 PM

## 2021-05-10 DIAGNOSIS — Z419 Encounter for procedure for purposes other than remedying health state, unspecified: Secondary | ICD-10-CM | POA: Diagnosis not present

## 2021-05-14 ENCOUNTER — Ambulatory Visit: Payer: Medicaid Other | Admitting: Oncology

## 2021-05-14 ENCOUNTER — Other Ambulatory Visit: Payer: Medicaid Other

## 2021-05-14 DIAGNOSIS — I10 Essential (primary) hypertension: Secondary | ICD-10-CM | POA: Diagnosis not present

## 2021-05-14 DIAGNOSIS — L700 Acne vulgaris: Secondary | ICD-10-CM | POA: Diagnosis not present

## 2021-05-14 DIAGNOSIS — H539 Unspecified visual disturbance: Secondary | ICD-10-CM | POA: Diagnosis not present

## 2021-05-14 NOTE — Progress Notes (Signed)
[facility]  Telephone:([phone]) [phone] Fax:(336) [phone]    ID: Alice [name] DOB: 08-05-1975  MR#: [id number]  VOH#:[id number]  Patient Care Team: Drue Flirt, MD as PCP - General (Family Medicine) Mauro Kaufmann, RN as Oncology Nurse Navigator Rockwell Germany, RN as Oncology Nurse Navigator Alphonsa Overall, MD as Consulting Physician (General Surgery) Jontavious Commons, Virgie Dad, MD as Consulting Physician (Oncology) Kyung Rudd, MD as Consulting Physician (Radiation Oncology) Larey Dresser, MD as Consulting Physician (Cardiology) Drue Flirt, MD (Family Medicine) Chauncey Cruel, MD OTHER MD:  CHIEF COMPLAINT: estrogen receptor negative, 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  She is now on observation.  She had her port removed on 12/04/2020.  She is scheduled for right breast reconstruction on 05/29/2021 with Dr. Claudia Desanctis.  She has already picked up her Zofran and Bactrim that she will take postop.   REVIEW OF SYSTEMS: Alice [name] tells me her brother-in-law has got her a treadmill and she gets on a twice a day.  She had a wonderful Thanksgiving with her family and showed me the photos which included a whole [place] and whole small pig cooked in a "[place] box" outside, which is the traditional way to fix it.  It looks like it was a wonderful event.  A detailed review of systems today was otherwise stable.   COVID 19 VACCINATION STATUS: Refuses vaccination; has not had COVID   HISTORY OF CURRENT ILLNESS: From the original intake note:  Alice [name] presented with right breast pain with swelling and skin changes (duskiness and dimpling) and left breast pain at 6 o'clock. She underwent bilateral diagnostic mammography with tomography and bilateral breast ultrasonography at [facility] on 09/23/2019  showing: breast density category C; 5.2 cm irregular mass in right breast spanning 10-12 o'clock, with marked peau d'orange changes concerning for inflammatory component; 3.3 cm enlarged lymph node in right axilla; indeterminate 1 cm oval mass in left breast at 5 o'clock.  Accordingly on 09/30/2019 she proceeded to biopsy of the right breast area in question. The pathology from this procedure ([id number]) showed: invasive mammary carcinoma, grade 3, e-cadherin positive. Prognostic indicators significant for: estrogen receptor, 0% negative and progesterone receptor, 0% negative. Proliferation marker Ki67 at 80%. HER2 positive by immunohistochemistry (3+).  The biopsied right axillary lymph node was positive for metastatic carcinoma.  She also underwent cyst aspiration of the 1 cm mass in the left breast the same day.  The patient's subsequent history is as detailed below.   PAST MEDICAL HISTORY: Past Medical History:  Diagnosis Date   Anemia 11/29/2020   Breast cancer ([place]) 2021   Right   History of chemotherapy    FINISHED CHEMO MAY 2022   Hypertension 11/29/2020    PAST SURGICAL HISTORY: Past Surgical History:  Procedure Laterality Date   BREAST BIOPSY     CHOLECYSTECTOMY     more than 10 yrs ago per pt on 11-29-2020, laparoscopic   IR IMAGING GUIDED PORT INSERTION  10/13/2019   MASTECTOMY MODIFIED RADICAL Right 03/06/2020   Procedure: MASTECTOMY MODIFIED RADICAL;  Surgeon: Alphonsa Overall, MD;  Location: [facility];  Service: General;  Laterality: Right;  RNFA   PORT-A-CATH REMOVAL Left 12/04/2020   Procedure: REMOVAL PORT-A-CATH;  Surgeon: Coralie Keens, MD;  Location: [facility];  Service: General;  Laterality: Left;    FAMILY HISTORY: No family history on  file.  As of 09/2019-- her father is 53 and her mother is 57. She has two brothers and one sister. There is no cancer in her family to her knowledge.    GYNECOLOGIC HISTORY:  Patient's last menstrual period  was 09/22/2019. Menarche: 45 years old Age at first live birth: 44 years old GX P 2 LMP regular periods lasting 3 days; periods stopped after the first chemotherapy dose May 2021 Contraceptive: previously used, has not used for the last 2 years HRT n/a  Hysterectomy? no BSO? no   SOCIAL HISTORY: (updated March 2022))  Alice [name] is originally from [place]  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 Husband Alice [name] works as a Training and development officer.  He has some health issues as well, including diabetes.  She lives at home with Alice [name] and their two children. Son Alice [name], age 58, has special needs.  Son Alice [name], age 49, is in high school.    ADVANCED DIRECTIVES: In the absence of any documentation to the contrary, the patient's spouse is their HCPOA.    HEALTH MAINTENANCE: Social History   Tobacco Use   Smoking status: Never   Smokeless tobacco: Never  Vaping Use   Vaping Use: Never used  Substance Use Topics   Alcohol use: Never   Drug use: Never     Colonoscopy: n/a (age)  PAP: 2020  Bone density: n/a (age)   No Known Allergies  Current Outpatient Medications  Medication Sig Dispense Refill   lisinopril (ZESTRIL) 10 MG tablet Take 1 tablet (10 mg total) by mouth daily. 90 tablet 4   ondansetron (ZOFRAN) 4 MG tablet Take 1 tablet (4 mg total) by mouth every 8 (eight) hours as needed for nausea or vomiting. 20 tablet 0   sulfamethoxazole-trimethoprim (BACTRIM DS) 800-160 MG tablet Take 1 tablet by mouth 2 (two) times daily for 14 days. 28 tablet 0   No current facility-administered medications for this visit.    OBJECTIVE: Spanish speaker who appears stated age  45:   05/15/21 1201  BP: 114/85  Pulse: 81  Resp: 16  Temp: (!) 97.5 F (36.4 C)  SpO2: 97%   Wt Readings from Last 3 Encounters:  05/15/21 212 lb 4.8 oz (96.3 kg)  05/09/21 213 lb 6.4 oz (96.8 kg)  03/28/21 214 lb 6.4 oz (97.3 kg)   Body mass index is 34.27 kg/m.    ECOG FS:1 -  Symptomatic but completely ambulatory   Sclerae unicteric, EOMs intact Wearing a mask No cervical or supraclavicular adenopathy Lungs no rales or rhonchi Heart regular rate and rhythm Abd soft, obese, nontender, positive bowel sounds MSK no focal spinal tenderness, no upper extremity lymphedema Neuro: nonfocal, well oriented, appropriate affect Breasts: The right breast is status postmastectomy and radiation.  There is no evidence of chest wall recurrence.  She has some numbness over that area on palpation.  The left breast is large but otherwise unremarkable.  Both axillae are benign.   LAB RESULTS:  CMP     Component Value Date/Time   NA 141 12/04/2020 1007   K 3.5 12/04/2020 1007   CL 101 12/04/2020 1007   CO2 27 11/02/2020 0809   GLUCOSE 136 (H) 12/04/2020 1007   BUN 13 12/04/2020 1007   CREATININE 0.80 12/04/2020 1007   CREATININE 0.79 10/06/2019 0826   CALCIUM 9.6 11/02/2020 0809   PROT 7.4 11/02/2020 0809   ALBUMIN 3.7 11/02/2020 0809   AST 36 11/02/2020 0809   AST 26 10/06/2019 0826  ALT 44 11/02/2020 0809   ALT 47 (H) 10/06/2019 0826   ALKPHOS 140 (H) 11/02/2020 0809   BILITOT 0.9 11/02/2020 0809   BILITOT 0.7 10/06/2019 0826   GFRNONAA >60 11/02/2020 0809   GFRNONAA >60 10/06/2019 0826   GFRAA >60 03/13/2020 1142   GFRAA >60 10/06/2019 0826    No results found for: Ronnald Ramp, A1GS, A2GS, BETS, BETA2SER, GAMS, MSPIKE, SPEI  Lab Results  Component Value Date   WBC 4.3 05/15/2021   NEUTROABS 2.6 05/15/2021   HGB 12.2 05/15/2021   HCT 37.7 05/15/2021   MCV 90.8 05/15/2021   PLT 299 05/15/2021    No results found for: LABCA2  No components found for: VELFYB017  No results for input(s): INR in the last 168 hours.  No results found for: LABCA2  No results found for: PZW258  No results found for: NID782  No results found for: UMP536  No results found for: CA2729  No components found for: HGQUANT  No results found for: CEA1 / No  results found for: CEA1   No results found for: AFPTUMOR  No results found for: CHROMOGRNA  No results found for: KPAFRELGTCHN, LAMBDASER, KAPLAMBRATIO (kappa/lambda light chains)  No results found for: HGBA, HGBA2QUANT, HGBFQUANT, HGBSQUAN (Hemoglobinopathy evaluation)   No results found for: LDH  No results found for: IRON, TIBC, IRONPCTSAT (Iron and TIBC)  No results found for: FERRITIN  Urinalysis No results found for: COLORURINE, APPEARANCEUR, LABSPEC, PHURINE, GLUCOSEU, HGBUR, BILIRUBINUR, KETONESUR, PROTEINUR, UROBILINOGEN, NITRITE, LEUKOCYTESUR   STUDIES: No results found.   ELIGIBLE FOR AVAILABLE RESEARCH PROTOCOL: no  ASSESSMENT: 45 y.o. Alice [name] speaker status post right breast upper outer quadrant sites biopsy and right axillary lymph node biopsy 09/30/2019 for a clinical T3 N2, stage IIIA invasive ductal carcinoma, grade 3, estrogen and progesterone receptor negative, HER-2 amplified, with an MIB-1 of 80%.  (a) CT chest abdomen and pelvis and bone scan 10/15/2019 showed no evidence of metastatic disease  (1) genetics testing 10/13/2019 through the Common Hereditary Cancers Panel offered by [facility] found no deleterious mutations in APC, ATM, AXIN2, BARD1, BMPR1A, BRCA1, BRCA2, BRIP1, CDH1, CDKN2A (p14ARF), CDKN2A (p16INK4a), CKD4, CHEK2, CTNNA1, DICER1, EPCAM (Deletion/duplication testing only), GREM1 (promoter region deletion/duplication testing only), KIT, MEN1, MLH1, MSH2, MSH3, MSH6, MUTYH, NBN, NF1, NHTL1, PALB2, PDGFRA, PMS2, POLD1, POLE, PTEN, RAD50, RAD51C, RAD51D, RNF43, SDHB, SDHC, SDHD, SMAD4, SMARCA4. STK11, TP53, TSC1, TSC2, and VHL.  The following genes were evaluated for sequence changes only: SDHA and HOXB13 c.251G>A variant only.  (2) neoadjuvant chemotherapy consisting of trastuzumab, pertuzumab, docetaxel and carboplatin every 21 days x 6 started 10/19/2019, completed 02/01/2020  (3) trastuzumab and pertuzumab continued to total 1 year  (through May 2022).  (a) echo 10/15/2019 shows an ejection fraction in the 65-70% range.  (b) echo 04/25/2020 shows an ejection fraction in the 60-65% range  (c) echo 07/17/2020 shows an ejection fraction in excess of 75%  (4) status post right modified radical mastectomy on 03/06/2020 showing no residual cancer in the breast or lymph nodes (ypT0 ypN0)  (a) a total of 7 right axillary lymph nodes were removed  (5) adjuvant radiation 04/13/20-05/30/20:   The right chest wall and regional nodes were treated to 50.4 Gy in 28 fractions, followed by a 10 Gy boost in 5 fractions.    PLAN: Deamber is now a little more than a year out from definitive surgery for her breast cancer with no evidence of disease recurrence.  This is very favorable.  Her prognosis overall  is good since she had a complete pathologic response to neoadjuvant treatment.  She is already scheduled for reconstruction on the right and breast reduction on the left 05/29/2021.  She has already picked up her medications and is exercising in preparation for the test, using her treadmill appropriately.  She will be due for her left mammography in May and I have entered that order.  She will see [place] shortly after that for routine follow-up  Total encounter time 20 minutes.Sarajane Jews C. Dryden Tapley, MD 05/15/21 12:14 PM Medical Oncology and Hematology [facility] [place], [place] [address] Tel. [phone]    Fax. [phone]   I, Wilburn Mylar, am acting as scribe for Dr. Virgie Dad. Shirell Struthers.  I, Lurline Del MD, have reviewed the above documentation for accuracy and completeness, and I agree with the above.   *Total Encounter Time as defined by the Centers for Medicare and Medicaid Services includes, in addition to the face-to-face time of a patient visit (documented in the note above) non-face-to-face time: obtaining and reviewing outside history, ordering and reviewing medications, tests or  procedures, care coordination (communications with other health care professionals or caregivers) and documentation in the medical record.

## 2021-05-15 ENCOUNTER — Inpatient Hospital Stay: Payer: Medicaid Other | Attending: Oncology

## 2021-05-15 ENCOUNTER — Inpatient Hospital Stay (HOSPITAL_BASED_OUTPATIENT_CLINIC_OR_DEPARTMENT_OTHER): Payer: Medicaid Other | Admitting: Oncology

## 2021-05-15 ENCOUNTER — Other Ambulatory Visit: Payer: Self-pay

## 2021-05-15 VITALS — BP 114/85 | HR 81 | Temp 97.5°F | Resp 16 | Ht 66.0 in | Wt 212.3 lb

## 2021-05-15 DIAGNOSIS — Z9221 Personal history of antineoplastic chemotherapy: Secondary | ICD-10-CM | POA: Insufficient documentation

## 2021-05-15 DIAGNOSIS — Z923 Personal history of irradiation: Secondary | ICD-10-CM | POA: Insufficient documentation

## 2021-05-15 DIAGNOSIS — C50411 Malignant neoplasm of upper-outer quadrant of right female breast: Secondary | ICD-10-CM

## 2021-05-15 DIAGNOSIS — Z853 Personal history of malignant neoplasm of breast: Secondary | ICD-10-CM | POA: Diagnosis not present

## 2021-05-15 DIAGNOSIS — I10 Essential (primary) hypertension: Secondary | ICD-10-CM | POA: Insufficient documentation

## 2021-05-15 DIAGNOSIS — Z9011 Acquired absence of right breast and nipple: Secondary | ICD-10-CM | POA: Insufficient documentation

## 2021-05-15 DIAGNOSIS — Z170 Estrogen receptor positive status [ER+]: Secondary | ICD-10-CM

## 2021-05-15 DIAGNOSIS — Z79899 Other long term (current) drug therapy: Secondary | ICD-10-CM | POA: Insufficient documentation

## 2021-05-15 LAB — COMPREHENSIVE METABOLIC PANEL
ALT: 46 U/L — ABNORMAL HIGH (ref 0–44)
AST: 33 U/L (ref 15–41)
Albumin: 4 g/dL (ref 3.5–5.0)
Alkaline Phosphatase: 142 U/L — ABNORMAL HIGH (ref 38–126)
Anion gap: 12 (ref 5–15)
BUN: 10 mg/dL (ref 6–20)
CO2: 23 mmol/L (ref 22–32)
Calcium: 9.4 mg/dL (ref 8.9–10.3)
Chloride: 104 mmol/L (ref 98–111)
Creatinine, Ser: 0.83 mg/dL (ref 0.44–1.00)
GFR, Estimated: >60 mL/min (ref >60)
Glucose, Bld: 109 mg/dL — ABNORMAL HIGH (ref 70–99)
Potassium: 3.8 mmol/L (ref 3.5–5.1)
Sodium: 139 mmol/L (ref 135–145)
Total Bilirubin: 1 mg/dL (ref 0.3–1.2)
Total Protein: 7.4 g/dL (ref 6.5–8.1)

## 2021-05-15 LAB — CBC WITH DIFFERENTIAL/PLATELET
Abs Immature Granulocytes: 0.01 10*3/uL (ref 0.00–0.07)
Basophils Absolute: 0 10*3/uL (ref 0.0–0.1)
Basophils Relative: 1 %
Eosinophils Absolute: 0.1 10*3/uL (ref 0.0–0.5)
Eosinophils Relative: 3 %
HCT: 37.7 % (ref 36.0–46.0)
Hemoglobin: 12.2 g/dL (ref 12.0–15.0)
Immature Granulocytes: 0 %
Lymphocytes Relative: 28 %
Lymphs Abs: 1.2 10*3/uL (ref 0.7–4.0)
MCH: 29.4 pg (ref 26.0–34.0)
MCHC: 32.4 g/dL (ref 30.0–36.0)
MCV: 90.8 fL (ref 80.0–100.0)
Monocytes Absolute: 0.4 10*3/uL (ref 0.1–1.0)
Monocytes Relative: 8 %
Neutro Abs: 2.6 10*3/uL (ref 1.7–7.7)
Neutrophils Relative %: 60 %
Platelets: 299 10*3/uL (ref 150–400)
RBC: 4.15 MIL/uL (ref 3.87–5.11)
RDW: 12.8 % (ref 11.5–15.5)
WBC: 4.3 10*3/uL (ref 4.0–10.5)
nRBC: 0 % (ref 0.0–0.2)

## 2021-05-26 NOTE — Progress Notes (Signed)
LVM on both the patient's and spouse's phone for the pt to arrive for surgery at 0530 to [facility] Entrance "A".

## 2021-05-28 NOTE — Anesthesia Preprocedure Evaluation (Addendum)
Anesthesia Evaluation  Patient identified by MRN, date of birth, ID band Patient awake    Reviewed: Allergy & Precautions, H&P , NPO status , Patient's Chart, lab work & pertinent test results  Airway Mallampati: II  TM Distance: >3 FB Neck ROM: Full    Dental no notable dental hx. (+) Teeth Intact, Dental Advisory Given   Pulmonary neg pulmonary ROS,    Pulmonary exam normal breath sounds clear to auscultation       Cardiovascular Exercise Tolerance: Good hypertension, Pt. on medications  Rhythm:Regular Rate:Normal     Neuro/Psych negative neurological ROS  negative psych ROS   GI/Hepatic negative GI ROS, Neg liver ROS,   Endo/Other  negative endocrine ROS  Renal/GU negative Renal ROS  negative genitourinary   Musculoskeletal   Abdominal   Peds  Hematology  (+) Blood dyscrasia, anemia ,   Anesthesia Other Findings   Reproductive/Obstetrics negative OB ROS                            Anesthesia Physical Anesthesia Plan  ASA: 2  Anesthesia Plan: General   Post-op Pain Management: Tylenol PO (pre-op)   Induction: Intravenous  PONV Risk Score and Plan: 4 or greater and Ondansetron, Dexamethasone and Midazolam  Airway Management Planned: Oral ETT  Additional Equipment:   Intra-op Plan:   Post-operative Plan: Extubation in OR  Informed Consent: I have reviewed the patients History and Physical, chart, labs and discussed the procedure including the risks, benefits and alternatives for the proposed anesthesia with the patient or authorized representative who has indicated his/her understanding and acceptance.     Dental advisory given and Interpreter used for interveiw  Plan Discussed with: CRNA  Anesthesia Plan Comments:       Anesthesia Quick Evaluation

## 2021-05-29 ENCOUNTER — Observation Stay (HOSPITAL_COMMUNITY)
Admission: RE | Admit: 2021-05-29 | Discharge: 2021-05-30 | Disposition: A | Discharge status: Home / Self Care | Payer: Medicaid Other | Attending: Plastic Surgery | Admitting: Plastic Surgery

## 2021-05-29 ENCOUNTER — Inpatient Hospital Stay (HOSPITAL_COMMUNITY): Payer: Medicaid Other | Admitting: Anesthesiology

## 2021-05-29 ENCOUNTER — Encounter (HOSPITAL_COMMUNITY): Payer: Self-pay | Admitting: Plastic Surgery

## 2021-05-29 ENCOUNTER — Encounter (HOSPITAL_COMMUNITY)
Admission: RE | Disposition: A | Discharge status: Home / Self Care | Payer: Self-pay | Source: Home / Self Care | Attending: Plastic Surgery

## 2021-05-29 DIAGNOSIS — Z170 Estrogen receptor positive status [ER+]: Secondary | ICD-10-CM | POA: Diagnosis not present

## 2021-05-29 DIAGNOSIS — Z923 Personal history of irradiation: Secondary | ICD-10-CM | POA: Insufficient documentation

## 2021-05-29 DIAGNOSIS — Z20822 Contact with and (suspected) exposure to covid-19: Secondary | ICD-10-CM | POA: Diagnosis not present

## 2021-05-29 DIAGNOSIS — Z9011 Acquired absence of right breast and nipple: Secondary | ICD-10-CM | POA: Diagnosis not present

## 2021-05-29 DIAGNOSIS — I10 Essential (primary) hypertension: Secondary | ICD-10-CM | POA: Insufficient documentation

## 2021-05-29 DIAGNOSIS — Z421 Encounter for breast reconstruction following mastectomy: Principal | ICD-10-CM | POA: Insufficient documentation

## 2021-05-29 DIAGNOSIS — D630 Anemia in neoplastic disease: Secondary | ICD-10-CM | POA: Diagnosis not present

## 2021-05-29 DIAGNOSIS — C50411 Malignant neoplasm of upper-outer quadrant of right female breast: Secondary | ICD-10-CM | POA: Insufficient documentation

## 2021-05-29 HISTORY — PX: BREAST RECONSTRUCTION WITH PLACEMENT OF TISSUE EXPANDER AND FLEX HD (ACELLULAR HYDRATED DERMIS): SHX6295

## 2021-05-29 LAB — SARS CORONAVIRUS 2 BY RT PCR (HOSPITAL ORDER, PERFORMED IN ~~LOC~~ HOSPITAL LAB): SARS Coronavirus 2: NEGATIVE

## 2021-05-29 SURGERY — BREAST RECONSTRUCTION WITH PLACEMENT OF TISSUE EXPANDER AND FLEX HD (ACELLULAR HYDRATED DERMIS)
Anesthesia: General | Site: Breast | Laterality: Right

## 2021-05-29 MED ORDER — HYDROMORPHONE HCL 1 MG/ML IJ SOLN
0.2500 mg | INTRAMUSCULAR | Status: DC | PRN
  Administered 2021-05-29 (×2): 0.5 mg via INTRAVENOUS

## 2021-05-29 MED ORDER — ORAL CARE MOUTH RINSE: 15.0000 mL | Freq: Once | OROMUCOSAL | Status: AC

## 2021-05-29 MED ORDER — CEFAZOLIN SODIUM 1 G IJ SOLR
INTRAMUSCULAR | Status: AC
  Filled 2021-05-29: qty 20

## 2021-05-29 MED ORDER — IBUPROFEN 600 MG PO TABS
600.0000 mg | ORAL_TABLET | Freq: Four times a day (QID) | ORAL | Status: DC | PRN
  Administered 2021-05-30: 11:00:00 600 mg via ORAL
  Filled 2021-05-29: qty 1

## 2021-05-29 MED ORDER — SULFAMETHOXAZOLE-TRIMETHOPRIM 800-160 MG PO TABS
1.0000 | ORAL_TABLET | Freq: Two times a day (BID) | ORAL | Status: DC
  Administered 2021-05-29 – 2021-05-30 (×2): 1 via ORAL
  Filled 2021-05-29 (×2): qty 1

## 2021-05-29 MED ORDER — DEXAMETHASONE SODIUM PHOSPHATE 10 MG/ML IJ SOLN
INTRAMUSCULAR | Status: DC | PRN
  Administered 2021-05-29: 10 mg via INTRAVENOUS

## 2021-05-29 MED ORDER — CEFAZOLIN SODIUM-DEXTROSE 2-4 GM/100ML-% IV SOLN
2.0000 g | INTRAVENOUS | Status: AC
  Administered 2021-05-29: 09:00:00 2 g via INTRAVENOUS
  Filled 2021-05-29: qty 100

## 2021-05-29 MED ORDER — MIDAZOLAM HCL 2 MG/2ML IJ SOLN
INTRAMUSCULAR | Status: DC | PRN
  Administered 2021-05-29: 2 mg via INTRAVENOUS

## 2021-05-29 MED ORDER — CHLORHEXIDINE GLUCONATE CLOTH 2 % EX PADS: 6.0000 | MEDICATED_PAD | Freq: Once | CUTANEOUS | Status: DC

## 2021-05-29 MED ORDER — SODIUM CHLORIDE 0.9 % IV SOLN: INTRAVENOUS | Status: DC | PRN

## 2021-05-29 MED ORDER — HYDROMORPHONE HCL 1 MG/ML IJ SOLN
INTRAMUSCULAR | Status: AC
  Filled 2021-05-29: qty 1

## 2021-05-29 MED ORDER — ACETAMINOPHEN 500 MG PO TABS
1000.0000 mg | ORAL_TABLET | Freq: Once | ORAL | Status: AC
  Administered 2021-05-29: 06:00:00 1000 mg via ORAL
  Filled 2021-05-29: qty 2

## 2021-05-29 MED ORDER — ONDANSETRON HCL 4 MG/2ML IJ SOLN
INTRAMUSCULAR | Status: DC | PRN
  Administered 2021-05-29: 4 mg via INTRAVENOUS

## 2021-05-29 MED ORDER — DIPHENHYDRAMINE HCL 25 MG PO CAPS: 25.0000 mg | ORAL_CAPSULE | Freq: Four times a day (QID) | ORAL | Status: DC | PRN

## 2021-05-29 MED ORDER — ONDANSETRON 4 MG PO TBDP: 4.0000 mg | ORAL_TABLET | Freq: Four times a day (QID) | ORAL | Status: DC | PRN

## 2021-05-29 MED ORDER — TRAMADOL HCL 50 MG PO TABS: 50.0000 mg | ORAL_TABLET | Freq: Four times a day (QID) | ORAL | Status: DC | PRN

## 2021-05-29 MED ORDER — ROCURONIUM BROMIDE 10 MG/ML (PF) SYRINGE
PREFILLED_SYRINGE | INTRAVENOUS | Status: DC | PRN
  Administered 2021-05-29: 80 mg via INTRAVENOUS
  Administered 2021-05-29 (×2): 20 mg via INTRAVENOUS

## 2021-05-29 MED ORDER — POLYETHYLENE GLYCOL 3350 17 G PO PACK: 17.0000 g | PACK | Freq: Every day | ORAL | Status: DC | PRN

## 2021-05-29 MED ORDER — ONDANSETRON HCL 4 MG/2ML IJ SOLN: 4.0000 mg | Freq: Four times a day (QID) | INTRAMUSCULAR | Status: DC | PRN

## 2021-05-29 MED ORDER — LACTATED RINGERS IV SOLN: INTRAVENOUS | Status: DC

## 2021-05-29 MED ORDER — BUPIVACAINE-EPINEPHRINE (PF) 0.25% -1:200000 IJ SOLN
INTRAMUSCULAR | Status: AC
  Filled 2021-05-29: qty 30

## 2021-05-29 MED ORDER — METHOCARBAMOL 500 MG PO TABS: 500.0000 mg | ORAL_TABLET | Freq: Four times a day (QID) | ORAL | Status: DC | PRN

## 2021-05-29 MED ORDER — FENTANYL CITRATE (PF) 250 MCG/5ML IJ SOLN
INTRAMUSCULAR | Status: AC
  Filled 2021-05-29: qty 5

## 2021-05-29 MED ORDER — MIDAZOLAM HCL 2 MG/2ML IJ SOLN
INTRAMUSCULAR | Status: AC
  Filled 2021-05-29: qty 2

## 2021-05-29 MED ORDER — SODIUM CHLORIDE 0.9 % IV SOLN
INTRAVENOUS | Status: AC | PRN
  Administered 2021-05-29: 1000 mL via INTRAMUSCULAR

## 2021-05-29 MED ORDER — 0.9 % SODIUM CHLORIDE (POUR BTL) OPTIME
TOPICAL | Status: DC | PRN
  Administered 2021-05-29: 10:00:00 1000 mL

## 2021-05-29 MED ORDER — FENTANYL CITRATE (PF) 250 MCG/5ML IJ SOLN
INTRAMUSCULAR | Status: DC | PRN
  Administered 2021-05-29 (×2): 100 ug via INTRAVENOUS
  Administered 2021-05-29: 50 ug via INTRAVENOUS
  Administered 2021-05-29 (×2): 100 ug via INTRAVENOUS
  Administered 2021-05-29: 50 ug via INTRAVENOUS

## 2021-05-29 MED ORDER — MORPHINE SULFATE (PF) 2 MG/ML IV SOLN
1.0000 mg | INTRAVENOUS | Status: DC | PRN
  Administered 2021-05-29 – 2021-05-30 (×2): 1 mg via INTRAVENOUS
  Filled 2021-05-29 (×2): qty 1

## 2021-05-29 MED ORDER — DIPHENHYDRAMINE HCL 50 MG/ML IJ SOLN: 25.0000 mg | Freq: Four times a day (QID) | INTRAMUSCULAR | Status: DC | PRN

## 2021-05-29 MED ORDER — CHLORHEXIDINE GLUCONATE 0.12 % MT SOLN
15.0000 mL | Freq: Once | OROMUCOSAL | Status: AC
  Administered 2021-05-29: 06:00:00 15 mL via OROMUCOSAL
  Filled 2021-05-29: qty 15

## 2021-05-29 MED ORDER — PHENYLEPHRINE HCL-NACL 20-0.9 MG/250ML-% IV SOLN
INTRAVENOUS | Status: DC | PRN
  Administered 2021-05-29: 25 ug/min via INTRAVENOUS

## 2021-05-29 MED ORDER — LIDOCAINE 2% (20 MG/ML) 5 ML SYRINGE
INTRAMUSCULAR | Status: DC | PRN
  Administered 2021-05-29: 60 mg via INTRAVENOUS

## 2021-05-29 MED ORDER — HYDROCODONE-ACETAMINOPHEN 5-325 MG PO TABS
1.0000 | ORAL_TABLET | ORAL | Status: DC | PRN
  Administered 2021-05-29 – 2021-05-30 (×2): 2 via ORAL
  Filled 2021-05-29 (×2): qty 2

## 2021-05-29 MED ORDER — INDOCYANINE GREEN 25 MG IV SOLR
INTRAVENOUS | Status: DC | PRN
  Administered 2021-05-29: 3 mg via INTRAVENOUS

## 2021-05-29 MED ORDER — PROPOFOL 10 MG/ML IV BOLUS
INTRAVENOUS | Status: DC | PRN
  Administered 2021-05-29: 130 mg via INTRAVENOUS

## 2021-05-29 SURGICAL SUPPLY — 65 items
BENZOIN TINCTURE PRP APPL 2/3 (GAUZE/BANDAGES/DRESSINGS) ×6 IMPLANT
BINDER ABDOMINAL 12 ML 46-62 (SOFTGOODS) ×2 IMPLANT
BINDER BREAST XXLRG (GAUZE/BANDAGES/DRESSINGS) ×2 IMPLANT
BIOPATCH RED 1 DISK 7.0 (GAUZE/BANDAGES/DRESSINGS) ×1 IMPLANT
BIOPATCH RED 1IN DISK 7.0MM (GAUZE/BANDAGES/DRESSINGS) ×1
BLADE SURG 15 STRL LF DISP TIS (BLADE) IMPLANT
BLADE SURG 15 STRL SS (BLADE) ×6
BNDG ELASTIC 6X5.8 VLCR STR LF (GAUZE/BANDAGES/DRESSINGS) ×1 IMPLANT
CHLORAPREP W/TINT 26 (MISCELLANEOUS) ×5 IMPLANT
CLOSURE WOUND 1/2 X4 (GAUZE/BANDAGES/DRESSINGS) ×2
DECANTER SPIKE VIAL GLASS SM (MISCELLANEOUS) ×2 IMPLANT
DRAIN CHANNEL 15F RND FF W/TCR (WOUND CARE) ×3 IMPLANT
DRAIN CHANNEL 19F RND (DRAIN) ×4 IMPLANT
DRAPE HALF SHEET 70X43 (DRAPES) ×4 IMPLANT
DRAPE INCISE IOBAN 66X45 STRL (DRAPES) ×2 IMPLANT
DRAPE ORTHO SPLIT 77X108 STRL (DRAPES) ×12
DRAPE SURG ORHT 6 SPLT 77X108 (DRAPES) ×2 IMPLANT
DRSG PAD ABDOMINAL 8X10 ST (GAUZE/BANDAGES/DRESSINGS) ×6 IMPLANT
DRSG TEGADERM 4X10 (GAUZE/BANDAGES/DRESSINGS) IMPLANT
DRSG TEGADERM 4X4.5 CHG (GAUZE/BANDAGES/DRESSINGS) ×2 IMPLANT
DRSG TEGADERM 4X4.75 (GAUZE/BANDAGES/DRESSINGS) ×2 IMPLANT
ELECT BLADE 4.0 EZ CLEAN MEGAD (MISCELLANEOUS) ×3
ELECT COATED BLADE 2.86 ST (ELECTRODE) ×2 IMPLANT
ELECT REM PT RETURN 9FT ADLT (ELECTROSURGICAL) ×3
ELECTRODE BLDE 4.0 EZ CLN MEGD (MISCELLANEOUS) IMPLANT
ELECTRODE REM PT RTRN 9FT ADLT (ELECTROSURGICAL) ×1 IMPLANT
EVACUATOR SILICONE 100CC (DRAIN) ×7 IMPLANT
EXPANDER 500CC IMPLANT (Breast) ×2 IMPLANT
GLOVE SRG 8 PF TXTR STRL LF DI (GLOVE) IMPLANT
GLOVE SURG ENC TEXT LTX SZ7 (GLOVE) ×3 IMPLANT
GLOVE SURG ENC TEXT LTX SZ7.5 (GLOVE) ×6 IMPLANT
GLOVE SURG UNDER POLY LF SZ8 (GLOVE)
GOWN STRL REUS W/ TWL LRG LVL3 (GOWN DISPOSABLE) ×2 IMPLANT
GOWN STRL REUS W/TWL LRG LVL3 (GOWN DISPOSABLE) ×6
IMPL EXPANDER BREAST 500CC (Breast) IMPLANT
IMPLANT EXPANDER BREAST 500CC (Breast) ×1 IMPLANT
KIT BASIN OR (CUSTOM PROCEDURE TRAY) ×3 IMPLANT
KIT FILL ASEPTIC TRANSFER (MISCELLANEOUS) ×2 IMPLANT
KIT FILL SYSTEM UNIVERSAL (SET/KITS/TRAYS/PACK) IMPLANT
LIGHT WAVEGUIDE WIDE FLAT (MISCELLANEOUS) ×2 IMPLANT
MARKER SKIN DUAL TIP RULER LAB (MISCELLANEOUS) ×2 IMPLANT
NDL HYPO 25X1 1.5 SAFETY (NEEDLE) IMPLANT
NEEDLE HYPO 25X1 1.5 SAFETY (NEEDLE) ×3 IMPLANT
PACK GENERAL/GYN (CUSTOM PROCEDURE TRAY) ×3 IMPLANT
PACK SPY-PHI (KITS) ×3 IMPLANT
PIN SAFETY STERILE (MISCELLANEOUS) ×3 IMPLANT
SLEEVE SCD COMPRESS KNEE MED (STOCKING) ×3 IMPLANT
SPONGE T-LAP 18X18 ~~LOC~~+RFID (SPONGE) ×8 IMPLANT
STAPLER INSORB 30 2030 C-SECTI (MISCELLANEOUS) ×2 IMPLANT
STAPLER VISISTAT 35W (STAPLE) ×2 IMPLANT
STRIP CLOSURE SKIN 1/2X4 (GAUZE/BANDAGES/DRESSINGS) ×3 IMPLANT
SUT ETHILON 2 0 FS 18 (SUTURE) ×6 IMPLANT
SUT MON AB 3-0 SH 27 (SUTURE)
SUT MON AB 3-0 SH27 (SUTURE) ×1 IMPLANT
SUT MON AB 4-0 PC3 18 (SUTURE) ×1 IMPLANT
SUT PDS AB 3-0 SH 27 (SUTURE) ×22 IMPLANT
SUT PLAIN 5 0 P 3 18 (SUTURE) IMPLANT
SUT VIC AB 2-0 CT1 (SUTURE) ×6 IMPLANT
SUT VIC AB 2-0 CT1 27 (SUTURE) ×9
SUT VIC AB 2-0 CT1 TAPERPNT 27 (SUTURE) IMPLANT
SUT VLOC 90 P-14 23 (SUTURE) ×10 IMPLANT
SYR CONTROL 10ML LL (SYRINGE) ×2 IMPLANT
TOWEL GREEN STERILE FF (TOWEL DISPOSABLE) ×6 IMPLANT
TUBE CONNECTING 20'X1/4 (TUBING) ×1
TUBE CONNECTING 20X1/4 (TUBING) ×2 IMPLANT

## 2021-05-29 NOTE — Transfer of Care (Signed)
Immediate Anesthesia Transfer of Care Note  Patient: Alice [name]  Procedure(s) Performed: Right breast reconstruction with latissimus flap and tissue expander placement.  Application of acellular dermal matrix. (Right: Breast)  Patient Location: PACU  Anesthesia Type:General  Level of Consciousness: awake and alert   Airway & Oxygen Therapy: Patient Spontanous Breathing  Post-op Assessment: Report given to RN and Post -op Vital [name] reviewed and stable  [name]: Reviewed and stable  Last Vitals:  Vitals Value Taken Time  BP 124/80 05/29/21 1251  Temp 36.4 C 05/29/21 1251  Pulse 78 05/29/21 1258  Resp 17 05/29/21 1258  SpO2 100 % 05/29/21 1258  Vitals shown include unvalidated device data.  Last Pain:  Vitals:   05/29/21 0627  TempSrc:   PainSc: 0-No pain         Complications: No notable events documented.

## 2021-05-29 NOTE — Anesthesia Procedure Notes (Signed)
Procedure Name: Intubation Date/Time: 05/29/2021 9:47 AM Performed by: Minerva Ends, CRNA Pre-anesthesia Checklist: Patient identified, Emergency Drugs available, Suction available and Patient being monitored Patient Re-evaluated:Patient Re-evaluated prior to induction Oxygen Delivery Method: Circle system utilized Preoxygenation: Pre-oxygenation with 100% oxygen Induction Type: IV induction Ventilation: Mask ventilation without difficulty Laryngoscope Size: Mac and 3 Grade View: Grade II Tube type: Oral Tube size: 6.5 mm Number of attempts: 1 Airway Equipment and Method: Stylet Placement Confirmation: ETT inserted through vocal cords under direct vision, positive ETCO2 and breath sounds checked- equal and bilateral Secured at: 22 cm Tube secured with: Tape Dental Injury: Teeth and Oropharynx as per pre-operative assessment

## 2021-05-29 NOTE — Interval H&P Note (Signed)
Patient seen and examined. Risks and benefits discussed. Proceed with surgery.

## 2021-05-29 NOTE — Anesthesia Postprocedure Evaluation (Signed)
Anesthesia Post Note  Patient: Alice [name]  Procedure(s) Performed: Right breast reconstruction with latissimus flap and tissue expander placement.  Application of acellular dermal matrix. (Right: Breast)     Anesthesia Post Evaluation No notable events documented.  Last Vitals:  Vitals:   05/29/21 1336 05/29/21 1345  BP: 140/88   Pulse: 74 73  Resp: 17 17  Temp:    SpO2: 100% 97%    Last Pain:  Vitals:   05/29/21 1345  TempSrc:   PainSc: Asleep                 Devorah Givhan,W. EDMOND

## 2021-05-29 NOTE — Op Note (Signed)
Operative Note   DATE OF OPERATION: 05/29/2021  SURGICAL DEPARTMENT: Plastic Surgery  PREOPERATIVE DIAGNOSES: History right breast cancer status postmastectomy and radiation  POSTOPERATIVE DIAGNOSES:  same  PROCEDURE: 1.  Right breast reconstruction with latissimus dorsi myocutaneous flap 2.  Placement of right breast tissue expander  SURGEON: Talmadge Coventry, MD  ASSISTANT: Verdie Shire, PA The advanced practice practitioner (APP) assisted throughout the case.  The APP was essential in retraction and counter traction when needed to make the case progress smoothly.  This retraction and assistance made it possible to see the tissue planes for the procedure.  The assistance was needed for hemostasis, tissue re-approximation and closure of the incision site.   ANESTHESIA:  General.   COMPLICATIONS: None.   INDICATIONS FOR PROCEDURE:  The patient, Alice [name] is a 45 y.o. female born on April 06, 1976, is here for treatment of right breast reconstruction MRN: [id number]  CONSENT:  Informed consent was obtained directly from the patient. Risks, benefits and alternatives were fully discussed. Specific risks including but not limited to bleeding, infection, hematoma, seroma, scarring, pain, contracture, asymmetry, wound healing problems, and need for further surgery were all discussed. The patient did have an ample opportunity to have questions answered to satisfaction.   DESCRIPTION OF PROCEDURE:  The patient was taken to the operating room. SCDs were placed and antibiotics were given.  General anesthesia was administered.  The patient's operative site was prepped and draped in a sterile fashion. A time out was performed and all information was confirmed to be correct.  I had marked preoperatively in the standing position.  A oblique skin paddle was planned along the relaxed skin tension lines.  We started with her lateral decubitus position left side down.  I incised along my marks  for the skin paddle with a knife.  Using cautery I beveled outward to capture as many perforators as possible.  I came down onto the latissimus at the edges and identify the borders.  To help with exposure a incision was made extending superiorly to help visualize the area of the pedicle.  I traced the latissimus down to its origin and it was divided with cautery.  It was then lifted up with the attached skin paddle in a inferior to superior direction.  As I approached the pedicle I switched to hemostat dissection and pedicle was able to be identified entering into the underside of the muscle.  It had a palpable pulse.  I then continued the dissection using a lighted retractor tunneling in the axillary area towards the right chest.  I then incised along her previous oblique mastectomy scar with a 15 blade and dissected down to the pectoralis major muscle.  The mastectomy flaps were elevated circumferentially to generate the pocket.  I then resumed dissecting from posterior to anterior to connect the 2 wounds.  Once the insertion of the latissimus had been freed up enough I was able to tunnel this flap into the right chest wound.  I examined the skin edges were bleeding which was the case throughout.  I then ensured meticulous hemostasis in the back placed 2 19 French drains.  Closure was done with with interrupted buried 2-0 Vicryl quilting sutures to tack the soft tissues down to the deeper fascia.  This was followed by combination of 3-0 PDS in an sorb staples and 3 oh V-Loc's.  The skin paddle was stapled in place and covered with Ioban.  Patient was then flipped supine and reprepped and redraped.  The [place] was removed.  Staples were removed.  Spy system was utilized to perform indocyanine green angiography which showed great perfusion of the entire flap.  I then started by tacking the latissimus muscle down in the periphery of the pocket starting superiorly and medially and to some degree laterally.  I then  brought the tissue expander onto the field.  This was a Neurosurgeon plus smooth tissue expanders with 500 cc prescribed fill volume.  Serial [id number].  3 of PDS sutures were placed in the chest wall to correspond to 3 of the suture tabs on the expander.  All air was removed from the expander and the stay sutures were then run through the suture tabs.  Pocket was irrigated with triple antibiotic solution and a 15 French drain was placed and secured with a nylon suture..  Expander was inserted into the pocket and had a nice fit and the stay sutures were tied down.  150 cc of saline was infiltrated into the expander.  This generated minimal tension on the skin closure.  A few additional 3-0 PDS sutures were used to tack down the latissimus muscle inferiorly to close off the pocket in that direction.  Skin closure was then done with interrupted buried 3-0 PDS sutures and a running 3 oh V-Loc.  Steri-Strips and soft dressings were applied.  The patient tolerated the procedure well.  There were no complications. The patient was allowed to wake from anesthesia, extubated and taken to the recovery room in satisfactory condition.

## 2021-05-30 ENCOUNTER — Encounter (HOSPITAL_COMMUNITY): Payer: Self-pay | Admitting: Plastic Surgery

## 2021-05-30 DIAGNOSIS — I10 Essential (primary) hypertension: Secondary | ICD-10-CM | POA: Diagnosis not present

## 2021-05-30 DIAGNOSIS — Z9011 Acquired absence of right breast and nipple: Secondary | ICD-10-CM | POA: Diagnosis not present

## 2021-05-30 DIAGNOSIS — Z923 Personal history of irradiation: Secondary | ICD-10-CM | POA: Diagnosis not present

## 2021-05-30 DIAGNOSIS — Z421 Encounter for breast reconstruction following mastectomy: Secondary | ICD-10-CM | POA: Diagnosis not present

## 2021-05-30 DIAGNOSIS — Z170 Estrogen receptor positive status [ER+]: Secondary | ICD-10-CM | POA: Diagnosis not present

## 2021-05-30 DIAGNOSIS — Z20822 Contact with and (suspected) exposure to covid-19: Secondary | ICD-10-CM | POA: Diagnosis not present

## 2021-05-30 DIAGNOSIS — C50411 Malignant neoplasm of upper-outer quadrant of right female breast: Secondary | ICD-10-CM | POA: Diagnosis not present

## 2021-05-30 LAB — HIV ANTIBODY (ROUTINE TESTING W REFLEX): HIV Screen 4th Generation wRfx: NONREACTIVE

## 2021-05-30 NOTE — Discharge Instructions (Signed)
Activity As tolerated: NO showers for 3 days. Keep Binder wrap on breasts until then. After showering, put ACE wrap back on, this is important for compression. Keep abdominal binder on unless showering/adjusting bandages. This helps with compression and swelling.  NO driving while in pain, taking pain medication or if you are unable to safely react to traffic. No heavy activities Take Pain medication (Norco) as needed for severe pain. Otherwise, you can use ibuprofen or tylenol as needed. Avoid more than 3,000 mg of tylenol in 24 hours. Norco has 325mg  of tylenol per dose.  Diet: Regular. Drink plenty of fluids and eat healthy, high protein, low carbs.  Wound Care: Keep dressing clean & dry. You may change bandages after showering if you continue to notice some drainage. You can reuse bandages if they are not dirty/soiled.  Empty drains as needed daily or twice daily. Record the drainage amount.  Special Instructions: Call Doctor if any unusual problems occur such as pain, excessive Bleeding, unrelieved Nausea/vomiting, Fever &/or chills  Follow-up appointment: Scheduled for next week.

## 2021-05-30 NOTE — Discharge Summary (Signed)
Physician Discharge Summary  Patient ID: Alice [name] MRN: [id number] DOB/AGE: Oct 01, 1975 45 y.o.  Admit date: 05/29/2021 Discharge date: 05/30/2021  Admission Diagnoses: Malignant neoplasm of upper-outer quadrant of right breast in female, estrogen receptor positive  Discharge Diagnoses:  Principal Problem:   Malignant neoplasm of upper-outer quadrant of right breast in female, estrogen receptor positive [facility])   Discharged Condition: good  Hospital Course: 45 year old female presented to the [facility] main operating room on 05/29/2021 for right breast reconstruction with latissimus dorsi myocutaneous muscle flap and placement of right breast tissue expander with Dr. Claudia Desanctis.  Postoperatively she remained hospitalized for observation.  No acute overnight events.  Her son is present at bedside this a.m. on evaluation.  She reports that she is doing well, she denies any fevers or chills.  She reports that pain is well controlled.  She reports that she has all of her medications at home.  She would like to go home.  JP drains overnight had serosanguineous output.  Drainage as expected.  Consults: None  Significant Diagnostic Studies: None  Treatments: antibiotics: Ancef and Bactrim and analgesia: Vicodin  Discharge Exam: Blood pressure (!) 144/91, pulse 87, temperature 97.9 F (36.6 C), temperature source Oral, resp. rate 16, height 5\' 6"  (1.676 m), weight 90.7 kg, last menstrual period 09/22/2019, SpO2 97 %. General appearance: alert, cooperative, no distress, and resting in bed Back: Right back incision noted from latissimus dorsi flap, incision is intact, Steri-Strips in place.  Scant drainage noted on Steri-Strips.  No erythema or cellulitic changes.  No subcutaneous fluid collections noted.  Bilateral JP drains in place with serosanguineous drainage in bulbs. Breasts: Normal appearance of left breast, right breast incisions are intact, latissimus skin paddle with good color and  capillary refill.  Scant drainage noted on incisions.  Right breast JP drain is in place with serosanguineous drainage in bulb.  No erythema or cellulitic changes.  No subcutaneous fluid collections noted with palpation. Extremities: extremities normal, atraumatic, no cyanosis or edema Pulses: 2+ and symmetric Skin: Skin color, texture, turgor normal. No rashes or lesions   Disposition: Discharge disposition: 01-Home or Self Care       Discharge Instructions     Call MD for:  difficulty breathing, headache or visual disturbances   Complete by: As directed    Call MD for:  extreme fatigue   Complete by: As directed    Call MD for:  hives   Complete by: As directed    Call MD for:  persistant dizziness or light-headedness   Complete by: As directed    Call MD for:  persistant nausea and vomiting   Complete by: As directed    Call MD for:  redness, tenderness, or signs of infection (pain, swelling, redness, odor or green/yellow discharge around incision site)   Complete by: As directed    Call MD for:  severe uncontrolled pain   Complete by: As directed    Call MD for:  temperature >100.4   Complete by: As directed    Diet - low sodium heart healthy   Complete by: As directed    Increase activity slowly   Complete by: As directed       Allergies as of 05/30/2021   No Known Allergies      Medication List     STOP taking these medications    ondansetron 4 MG tablet Commonly known as: Zofran       TAKE these medications    lisinopril 10 MG  tablet Commonly known as: ZESTRIL Take 1 tablet (10 mg total) by mouth daily.         [facility] [address] [place], [place] [address] [phone]  Signed: Carola Rhine Nakina Spatz 05/30/2021, 4:21 PM

## 2021-05-30 NOTE — Progress Notes (Signed)
Discharge instructions given to patient and son patient verbalizes understanding. JP drain instructions given to patient. Equipment to take home given to patient. IV removed. Patient discharged, via discharge lounge

## 2021-06-06 ENCOUNTER — Ambulatory Visit (INDEPENDENT_AMBULATORY_CARE_PROVIDER_SITE_OTHER): Payer: Medicaid Other | Admitting: Plastic Surgery

## 2021-06-06 ENCOUNTER — Other Ambulatory Visit: Payer: Self-pay

## 2021-06-06 DIAGNOSIS — Z170 Estrogen receptor positive status [ER+]: Secondary | ICD-10-CM

## 2021-06-06 DIAGNOSIS — C50411 Malignant neoplasm of upper-outer quadrant of right female breast: Secondary | ICD-10-CM

## 2021-06-06 NOTE — Progress Notes (Signed)
Patient presents 1 week postop from latissimus and expander placement on the right side.  She is overall feeling well.  Her drains are putting out serosanguineous fluid.  No signs of subcutaneous fluid or healing problems on exam.  All incisions are intact.  No signs of any infection.  Her second drain in the back is putting out the least so that was removed today.  I suspect the other 2 will be ready for removal next week.  Otherwise things are going well.  All of her questions were answered through the interpreter.

## 2021-06-10 DIAGNOSIS — Z419 Encounter for procedure for purposes other than remedying health state, unspecified: Secondary | ICD-10-CM | POA: Diagnosis not present

## 2021-06-13 ENCOUNTER — Other Ambulatory Visit: Payer: Self-pay

## 2021-06-13 ENCOUNTER — Ambulatory Visit (INDEPENDENT_AMBULATORY_CARE_PROVIDER_SITE_OTHER): Payer: Medicaid Other | Admitting: Plastic Surgery

## 2021-06-13 DIAGNOSIS — C50411 Malignant neoplasm of upper-outer quadrant of right female breast: Secondary | ICD-10-CM

## 2021-06-13 DIAGNOSIS — Z170 Estrogen receptor positive status [ER+]: Secondary | ICD-10-CM

## 2021-06-13 NOTE — Progress Notes (Signed)
Patient presents 2 weeks postop from right latissimus and expander placement for reconstruction.  Overall she feels like things are coming along okay.  Her pain and range of motion are improving.  She still does have some limitations in right shoulder range of motion due to pain.  Her drains are putting out less than 25 cc a day so they were both removed..  All of her incisions are intact and no obvious signs of wound healing complication.  Flap is totally viable and looks healthy.  We will plan to send her to physical therapy to try and work on shoulder range of motion so does not get too stiff.  I will see her again in 2 weeks and will likely start the fills of the expander.  All of her questions were answered.

## 2021-06-27 ENCOUNTER — Other Ambulatory Visit: Payer: Self-pay

## 2021-06-27 ENCOUNTER — Ambulatory Visit (INDEPENDENT_AMBULATORY_CARE_PROVIDER_SITE_OTHER): Payer: Medicaid Other | Admitting: Plastic Surgery

## 2021-06-27 DIAGNOSIS — Z170 Estrogen receptor positive status [ER+]: Secondary | ICD-10-CM

## 2021-06-27 DIAGNOSIS — C50411 Malignant neoplasm of upper-outer quadrant of right female breast: Secondary | ICD-10-CM

## 2021-06-27 NOTE — Progress Notes (Signed)
Patient presents about a month postop from latissimus flap with expander placement for right breast reconstruction.  Overall she is doing well.  She has had some shoulder stiffness which is improving on its own and she starts seeing physical therapy tomorrow.  On exam her incisions are looking good and the flap is healing nicely.  We elected to start filling today and 150 cc were placed into the expander after prepping the skin with an alcohol pad.  This gives her a current volume of 300 cc and a 500 cc expander.  We will plan to see her again in 2 to 3 weeks to continue the expansion process.  All of her questions were answered.

## 2021-06-28 ENCOUNTER — Ambulatory Visit: Payer: Medicaid Other | Attending: Plastic Surgery

## 2021-06-28 ENCOUNTER — Other Ambulatory Visit: Payer: Self-pay

## 2021-06-28 DIAGNOSIS — M6281 Muscle weakness (generalized): Secondary | ICD-10-CM | POA: Diagnosis not present

## 2021-06-28 DIAGNOSIS — M25511 Pain in right shoulder: Secondary | ICD-10-CM | POA: Diagnosis not present

## 2021-06-28 DIAGNOSIS — G8929 Other chronic pain: Secondary | ICD-10-CM | POA: Diagnosis not present

## 2021-06-28 NOTE — Therapy (Signed)
OUTPATIENT PHYSICAL THERAPY SHOULDER EVALUATION   Patient Name: Alice [name] MRN: [id number] DOB:1976-01-28, 46 y.o., female Today's Date: 06/28/2021   PT End of Session - 06/28/21 1040     Visit Number 1    Number of Visits 17    Date for PT Re-Evaluation 08/23/21    Authorization Type [employer]    Authorization Time Period initial auth submitted    PT Start Time 4315    PT Stop Time 1125    PT Time Calculation (min) 40 min    Activity Tolerance Patient tolerated treatment well    Behavior During Therapy [facility] for tasks assessed/performed             Past Medical History:  Diagnosis Date   Anemia 11/29/2020   Breast cancer ([place]) 2021   Right   History of chemotherapy    FINISHED CHEMO MAY 2022   Hypertension 11/29/2020   Past Surgical History:  Procedure Laterality Date   BREAST BIOPSY     BREAST RECONSTRUCTION WITH PLACEMENT OF TISSUE EXPANDER AND FLEX HD (ACELLULAR HYDRATED DERMIS) Right 05/29/2021   Procedure: Right breast reconstruction with latissimus flap and tissue expander placement.  Application of acellular dermal matrix.;  Surgeon: Cindra Presume, MD;  Location: [place];  Service: Plastics;  Laterality: Right;   CHOLECYSTECTOMY     more than 10 yrs ago per pt on 11-29-2020, laparoscopic   IR IMAGING GUIDED PORT INSERTION  10/13/2019   MASTECTOMY MODIFIED RADICAL Right 03/06/2020   Procedure: MASTECTOMY MODIFIED RADICAL;  Surgeon: Alphonsa Overall, MD;  Location: [facility];  Service: General;  Laterality: Right;  RNFA   PORT-A-CATH REMOVAL Left 12/04/2020   Procedure: REMOVAL PORT-A-CATH;  Surgeon: Coralie Keens, MD;  Location: [facility];  Service: General;  Laterality: Left;   Patient Active Problem List   Diagnosis Date Noted   Port-A-Cath in place 11/09/2019   Genetic testing 10/14/2019   Malignant neoplasm of upper-outer quadrant of right breast in female, estrogen receptor positive ([place]) 10/05/2019    PCP: Drue Flirt, MD  REFERRING PROVIDER: Cindra Presume, MD  REFERRING DIAG:  [phone] (ICD-10-CM) - Malignant neoplasm of upper-outer quadrant of right female breast Z17.0 (ICD-10-CM) - Estrogen receptor positive status (ER+)   THERAPY DIAG:  Chronic right shoulder pain  Muscle weakness (generalized)   ONSET DATE: 05/29/2022  SUBJECTIVE:                                                                                                                                                                                      SUBJECTIVE STATEMENT: Pt presents to PT with reports of Rt shoulder  pain s/p R breast reconstruction on 05/29/22. She is Rt hand dominant and has had increased difficulty with dressing and performing home ADLs since surgery. She has had previous physical therapy while undergoing treatment for Rt breast cancer with good success. Pain has been fairly significant since surgery, with main compliant of limited ROM. Has some numbness in medial proximal Rt UE.  PERTINENT HISTORY: Pt presents to PT with reports of R shoulder pain s/p R breast reconstruction on 05/29/22  PAIN:  Are you having pain? Yes NPRS scale: 8/10 (also 8/10 at worst) Pain location: R shoulder, R axilla PAIN TYPE: tight Pain description: intermittent  Aggravating factors: dressing Relieving factors: medication  PRECAUTIONS: None  WEIGHT BEARING RESTRICTIONS Yes no heavy lifting for R shoulder  FALLS:  Has patient fallen in last 6 months? 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; was doing cleaning services, currently unable  PLOF: Independent and Independent with basic ADLs  PATIENT GOALS: Pt wants to decrease R shoulder pain and improve functional ability  OBJECTIVE:   DIAGNOSTIC FINDINGS:   N/A  PATIENT SURVEYS:  Quick Dash 80% disability  COGNITION:  Overall cognitive status: Within functional limits for tasks assessed     SENSATION:  Light touch: Appears intact   POSTURE: Rounded shoulders, medium body habitus  PALPATION: TTP to R deltoid, R upper trap, R bicep  UPPER EXTREMITY AROM/PROM:  A/PROM Right 06/28/2021 Left 06/28/2021  Shoulder flexion 70 WFL  Shoulder abduction 72 WFL  Shoulder internal rotation 25 WFL  Shoulder external rotation 40 WFL  (Blank rows = not tested)  UPPER EXTREMITY MMT:  MMT Right 06/28/2021 Left 06/28/2021  Shoulder flexion 2+/5 [facility]  Shoulder abduction 2+/5 [facility]  Shoulder IR 2+/5 [facility]  Shoulder ER 2+/5 WFL  Grip strength (lbs)    (Blank rows = not tested)  SHOULDER SPECIAL TESTS:  N/A  JOINT MOBILITY TESTING:  N/A   TODAY'S TREATMENT:  Therapeutic Exercise: Seated scapular retraction x 10 - 3" R shoulder table slide flex/abd x 5 - 5" Corner stretch x 20"  PATIENT EDUCATION: Education details: eval findings, Quick DASH, HEP, POC Person educated: Patient Education method: Explanation, Demonstration, and Handouts Education comprehension: verbalized understanding and returned demonstration   HOME EXERCISE PROGRAM: Cdigo de acceso: [id number]  ASSESSMENT:  CLINICAL IMPRESSION: Patient is a 46 y.o. F who was seen today for physical therapy evaluation and treatment for Rt shoulder pain s/p R breast reconstruction on 05/29/22. Physical findings are consistent with physician impression, with objective impairments including decreased activity tolerance, decreased ROM, decreased strength, and pain. These impairments are limiting patient from cleaning, community activity, meal prep, occupation, and shopping. Personal factors including Fitness and 1-2 comorbidities: breast cancer and HTN  are also affecting patient's functional outcome. Her Quick DASH score indicates severe disability in the performance of home ADLs and other  functional activities, indicating a sharp decline from PLOF. Patient will benefit from skilled PT to address above impairments and improve overall function.  REHAB POTENTIAL: Good  CLINICAL DECISION MAKING: Stable/uncomplicated  EVALUATION COMPLEXITY: Low   GOALS: Goals reviewed with patient? No  SHORT TERM GOALS:  STG Name Target Date Goal status  1 Pt will be compliant and knowledgeable with initial HEP for improved comfort and carryover  Baseline: initial HEP given 07/19/2021 INITIAL  2 Pt will self report  R shoulder/axilla pain no greater than 6/10 for improved comfort and functional ability Baseline: 8/10 at worst 07/19/2021 INITIAL   LONG TERM GOALS:   LTG Name Target Date Goal status  1 Pt will decrease Quick DASH disability score to no greater than 50% as proxy for functional improvement Baseline: 80% disability  08/23/2021 INITIAL  2 Pt will self report R shoulder/axilla pain no greater than 2/10 for improved comfort and functional ability Baseline: 8/10 at worst 08/23/2021 INITIAL  3 Pt will improve R shoulder flex to no less than 140 degrees in order to reach into cabinets and improve functional ability Baseline: 08/23/2021 INITIAL  4 Pt will improve R shoulder MMT to no less than 4/5 for improved functional ability with ADLs Baseline:  MMT Right 06/28/2021 Left 06/28/2021  Shoulder flexion 2+/5 [facility]  Shoulder abduction 2+/5 [facility]  Shoulder IR 2+/5 [facility]  Shoulder ER 2+/5 [facility]  Grip strength (lbs)    (Blank rows = not tested) 08/23/2021 INITIAL   PLAN: PT FREQUENCY: 2x/week  PT DURATION: 8 weeks  PLANNED INTERVENTIONS: Therapeutic exercises, Therapeutic activity, Neuro Muscular re-education, Balance training, Gait training, Patient/Family education, Joint mobilization, Cryotherapy, Moist heat, Taping, Vasopneumatic device, and Manual therapy  PLAN FOR NEXT SESSION: assess response to HEP, progress periscapular strength, PROM to R shoulder, progress Pedro Earls 06/28/2021, 12:53 PM

## 2021-07-02 ENCOUNTER — Other Ambulatory Visit: Payer: Self-pay

## 2021-07-02 ENCOUNTER — Ambulatory Visit: Payer: Medicaid Other

## 2021-07-02 DIAGNOSIS — G8929 Other chronic pain: Secondary | ICD-10-CM

## 2021-07-02 DIAGNOSIS — M25511 Pain in right shoulder: Secondary | ICD-10-CM

## 2021-07-02 DIAGNOSIS — M6281 Muscle weakness (generalized): Secondary | ICD-10-CM | POA: Diagnosis not present

## 2021-07-02 NOTE — Therapy (Signed)
OUTPATIENT PHYSICAL THERAPY TREATMENT NOTE   Patient Name: Alice [name] MRN: [id number] DOB:1975-08-27, 46 y.o., female Today's Date: 07/02/2021  PCP: Drue Flirt, MD REFERRING PROVIDER: Drue Flirt, MD   PT End of Session - 07/02/21 1119     Visit Number 2    Number of Visits 17    Date for PT Re-Evaluation 08/23/21    Authorization Type [place] [employer]    Authorization Time Period initial auth submitted    Authorization - Visit Number 1    Authorization - Number of Visits 12    PT Start Time 1120    PT Stop Time 7425    PT Time Calculation (min) 55 min    Activity Tolerance Patient tolerated treatment well    Behavior During Therapy [facility] for tasks assessed/performed             Past Medical History:  Diagnosis Date   Anemia 11/29/2020   Breast cancer ([place]) 2021   Right   History of chemotherapy    FINISHED CHEMO MAY 2022   Hypertension 11/29/2020   Past Surgical History:  Procedure Laterality Date   BREAST BIOPSY     BREAST RECONSTRUCTION WITH PLACEMENT OF TISSUE EXPANDER AND FLEX HD (ACELLULAR HYDRATED DERMIS) Right 05/29/2021   Procedure: Right breast reconstruction with latissimus flap and tissue expander placement.  Application of acellular dermal matrix.;  Surgeon: Cindra Presume, MD;  Location: [place];  Service: Plastics;  Laterality: Right;   CHOLECYSTECTOMY     more than 10 yrs ago per pt on 11-29-2020, laparoscopic   IR IMAGING GUIDED PORT INSERTION  10/13/2019   MASTECTOMY MODIFIED RADICAL Right 03/06/2020   Procedure: MASTECTOMY MODIFIED RADICAL;  Surgeon: Alphonsa Overall, MD;  [facility];  Service: General;  Laterality: Right;  RNFA   PORT-A-CATH REMOVAL Left 12/04/2020   Procedure: REMOVAL PORT-A-CATH;  Surgeon: Coralie Keens, MD;  Location: [place];  Service: General;  Laterality: Left;   Patient Active Problem List   Diagnosis Date Noted   Port-A-Cath in place 11/09/2019   Genetic testing 10/14/2019    Malignant neoplasm of upper-outer quadrant of right breast in female, estrogen receptor positive ([place]) 10/05/2019    REFERRING DIAG:  C50.411 (ICD-10-CM) - Malignant neoplasm of upper-outer quadrant of right female breast Z17.0 (ICD-10-CM) - Estrogen receptor positive status (ER+)  THERAPY DIAG:  Chronic right shoulder pain  Muscle weakness (generalized)  PERTINENT HISTORY:  Pt presents to PT with reports of R shoulder pain s/p R breast reconstruction on 05/29/22  PRECAUTIONS: None  SUBJECTIVE: Pt presents to PT with no current reports of pain or discomfort. Has been compliant with initial HEP, does report some pain with exercises. Is ready to begin PT treatment at this time.  PAIN:  Are you having pain? No NPRS scale: 0/10 Pain location: N/A PAIN TYPE: tight Pain description: intermittent  Aggravating factors: dressing Relieving factors: medication    OBJECTIVE:  UPPER EXTREMITY AROM/PROM:   A/PROM Right 06/28/2021 Left 06/28/2021  Shoulder flexion 70 WFL  Shoulder abduction 72 WFL  Shoulder internal rotation 25 WFL  Shoulder external rotation 40 WFL  (Blank rows = not tested)   UPPER EXTREMITY MMT:   MMT Right 06/28/2021 Left 06/28/2021  Shoulder flexion 2+/5 [facility]  Shoulder abduction 2+/5 [facility]  Shoulder IR 2+/5 [facility]  Shoulder ER 2+/5 WFL  Grip strength (lbs)      (Blank rows = not tested)     TODAY'S TREATMENT: Therapeutic Exercise: UBE lvl  1.0 x 4 min (3fwd/2bwd) while taking subjective Pulleys R shoulder flex x 2 min - 5" hold Row 3x10 - 3" hold R shoulder table slide flex 2x10 - 5" Corner stretch 2x30" Supine R shoulder flex AAAROM w/ L x 10 - 10" Supine cane flex AAROM 2x10 - 5" Supine R shoulder ER AAROM w/ dow x 10 - 5" S/L R shoulder abd 2x10 Seated horizontal abd 2x10 YTB (below 90 deg)   PATIENT EDUCATION: Education details: HEP Person educated: Patient Education method: Consulting civil engineer, Media planner, and Handouts Education comprehension:  verbalized understanding and returned demonstration     HOME EXERCISE PROGRAM: Cdigo de acceso: [email or web address]   ASSESSMENT: Pt was able to complete prescribed exercises with no adverse effect or increase in pain. Therapy today focused on improving R shoulder ROM and AAROM as well as periscapular strength in order to decrease pain and improve functional ability with ADLs. Pt continues to have significant limitation in R shoulder ROM, with flex/abd limited below 90 deg. She continues to benefit from skilled PT services and will continue to be seen and progressed as tolerated.      GOALS: Goals reviewed with patient? No   SHORT TERM GOALS:   STG Name Target Date Goal status  1 Pt will be compliant and knowledgeable with initial HEP for improved comfort and carryover  Baseline: initial HEP given 07/19/2021 INITIAL  2 Pt will self report R shoulder/axilla pain no greater than 6/10 for improved comfort and functional ability Baseline: 8/10 at worst 07/19/2021 INITIAL    LONG TERM GOALS:    LTG Name Target Date Goal status  1 Pt will decrease Quick DASH disability score to no greater than 50% as proxy for functional improvement Baseline: 80% disability  08/23/2021 INITIAL  2 Pt will self report R shoulder/axilla pain no greater than 2/10 for improved comfort and functional ability Baseline: 8/10 at worst 08/23/2021 INITIAL  3 Pt will improve R shoulder flex to no less than 140 degrees in order to reach into cabinets and improve functional ability Baseline: 08/23/2021 INITIAL  4 Pt will improve R shoulder MMT to no less than 4/5 for improved functional ability with ADLs Baseline:  MMT Right 06/28/2021 Left 06/28/2021  Shoulder flexion 2+/5 [facility]  Shoulder abduction 2+/5 [facility]  Shoulder IR 2+/5 [facility]  Shoulder ER 2+/5 [facility]  Grip strength (lbs)      (Blank rows = not tested) 08/23/2021 INITIAL    PLAN: PT FREQUENCY: 2x/week   PT DURATION: 8 weeks   PLANNED INTERVENTIONS: Therapeutic exercises,  Therapeutic activity, Neuro Muscular re-education, Balance training, Gait training, Patient/Family education, Joint mobilization, Cryotherapy, Moist heat, Taping, Vasopneumatic device, and Manual therapy   PLAN FOR NEXT SESSION: assess response to HEP, progress periscapular strength, PROM to R shoulder, progress Pedro Earls 07/02/2021, 1:44 PM

## 2021-07-03 ENCOUNTER — Encounter: Payer: Self-pay | Admitting: Oncology

## 2021-07-03 ENCOUNTER — Encounter (HOSPITAL_COMMUNITY): Payer: Self-pay | Admitting: Emergency Medicine

## 2021-07-03 ENCOUNTER — Other Ambulatory Visit: Payer: Self-pay

## 2021-07-03 ENCOUNTER — Emergency Department (HOSPITAL_COMMUNITY): Payer: Medicaid Other

## 2021-07-03 ENCOUNTER — Emergency Department (HOSPITAL_COMMUNITY)
Admission: EM | Admit: 2021-07-03 | Discharge: 2021-07-04 | Disposition: A | Discharge status: Home / Self Care | Payer: Medicaid Other | Attending: Emergency Medicine | Admitting: Emergency Medicine

## 2021-07-03 ENCOUNTER — Encounter (HOSPITAL_COMMUNITY): Payer: Self-pay

## 2021-07-03 ENCOUNTER — Emergency Department (HOSPITAL_COMMUNITY)
Admission: EM | Admit: 2021-07-03 | Discharge: 2021-07-03 | Disposition: A | Discharge status: Home / Self Care | Payer: Medicaid Other | Attending: Emergency Medicine | Admitting: Emergency Medicine

## 2021-07-03 DIAGNOSIS — M546 Pain in thoracic spine: Secondary | ICD-10-CM | POA: Diagnosis not present

## 2021-07-03 DIAGNOSIS — R0789 Other chest pain: Secondary | ICD-10-CM | POA: Insufficient documentation

## 2021-07-03 DIAGNOSIS — M542 Cervicalgia: Secondary | ICD-10-CM | POA: Insufficient documentation

## 2021-07-03 DIAGNOSIS — I10 Essential (primary) hypertension: Secondary | ICD-10-CM | POA: Insufficient documentation

## 2021-07-03 DIAGNOSIS — Z853 Personal history of malignant neoplasm of breast: Secondary | ICD-10-CM | POA: Diagnosis not present

## 2021-07-03 DIAGNOSIS — Z041 Encounter for examination and observation following transport accident: Secondary | ICD-10-CM | POA: Diagnosis not present

## 2021-07-03 DIAGNOSIS — S199XXA Unspecified injury of neck, initial encounter: Secondary | ICD-10-CM | POA: Diagnosis not present

## 2021-07-03 DIAGNOSIS — Y92410 Unspecified street and highway as the place of occurrence of the external cause: Secondary | ICD-10-CM | POA: Diagnosis not present

## 2021-07-03 MED ORDER — KETOROLAC TROMETHAMINE 15 MG/ML IJ SOLN
7.5000 mg | Freq: Once | INTRAMUSCULAR | Status: AC
  Administered 2021-07-03: 23:00:00 7.5 mg via INTRAMUSCULAR
  Filled 2021-07-03: qty 1
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
## 2021-07-03 NOTE — Discharge Instructions (Signed)
You were evaluated in the Emergency Department and after careful evaluation, we did not find any emergent condition requiring admission or further testing in the hospital.  Your exam/testing today was overall reassuring.  CT and x-ray today without any significant injuries.  Recommend Tylenol or Motrin at home for any continued discomfort.  Please return to the Emergency Department if you experience any worsening of your condition.  Thank you for allowing [place] to be a part of your care.
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
## 2021-07-05 ENCOUNTER — Telehealth: Payer: Self-pay

## 2021-07-05 NOTE — Telephone Encounter (Signed)
Transition Care Management Unsuccessful Follow-up Telephone Call  Date of discharge and from where:  07/04/2021-[place]   Attempts:  1st Attempt  Reason for unsuccessful TCM follow-up call:  Left voice message

## 2021-07-06 NOTE — Telephone Encounter (Signed)
Transition Care Management Unsuccessful Follow-up Telephone Call  Date of discharge and from where:  07/04/2021-[place]   Attempts:  2nd Attempt  Reason for unsuccessful TCM follow-up call:  Left voice message

## 2021-07-09 ENCOUNTER — Encounter: Payer: Self-pay | Admitting: Physical Therapy

## 2021-07-09 ENCOUNTER — Ambulatory Visit: Payer: Medicaid Other | Admitting: Physical Therapy

## 2021-07-09 ENCOUNTER — Other Ambulatory Visit: Payer: Self-pay

## 2021-07-09 DIAGNOSIS — M6281 Muscle weakness (generalized): Secondary | ICD-10-CM

## 2021-07-09 DIAGNOSIS — M25511 Pain in right shoulder: Secondary | ICD-10-CM | POA: Diagnosis not present

## 2021-07-09 DIAGNOSIS — G8929 Other chronic pain: Secondary | ICD-10-CM | POA: Diagnosis not present

## 2021-07-09 NOTE — Telephone Encounter (Signed)
Transition Care Management Unsuccessful Follow-up Telephone Call  Date of discharge and from where:  07/04/2021-[place]   Attempts:  3rd Attempt  Reason for unsuccessful TCM follow-up call:  Left voice message

## 2021-07-09 NOTE — Therapy (Signed)
OUTPATIENT PHYSICAL THERAPY TREATMENT NOTE   Patient Name: Alice [name] MRN: [id number] DOB:1976-05-22, 46 y.o., female Today's Date: 07/09/2021  PCP: Drue Flirt, MD REFERRING PROVIDER: Drue Flirt, MD   PT End of Session - 07/09/21 0933     Visit Number 3    Number of Visits 17    Date for PT Re-Evaluation 08/23/21    Authorization Type [place] [employer] [facility]    Authorization Time Period 07/02/21-08/31/21    Authorization - Visit Number 2    Authorization - Number of Visits 12    PT Start Time 0933    PT Stop Time 6546    PT Time Calculation (min) 39 min             Past Medical History:  Diagnosis Date   Anemia 11/29/2020   Breast cancer ([place]) 2021   Right   History of chemotherapy    FINISHED CHEMO MAY 2022   Hypertension 11/29/2020   Past Surgical History:  Procedure Laterality Date   BREAST BIOPSY     BREAST RECONSTRUCTION WITH PLACEMENT OF TISSUE EXPANDER AND FLEX HD (ACELLULAR HYDRATED DERMIS) Right 05/29/2021   Procedure: Right breast reconstruction with latissimus flap and tissue expander placement.  Application of acellular dermal matrix.;  Surgeon: Cindra Presume, MD;  Location: [place];  Service: Plastics;  Laterality: Right;   CHOLECYSTECTOMY     more than 10 yrs ago per pt on 11-29-2020, laparoscopic   IR IMAGING GUIDED PORT INSERTION  10/13/2019   MASTECTOMY MODIFIED RADICAL Right 03/06/2020   Procedure: MASTECTOMY MODIFIED RADICAL;  Surgeon: Alphonsa Overall, MD;  [facility];  Service: General;  Laterality: Right;  RNFA   PORT-A-CATH REMOVAL Left 12/04/2020   Procedure: REMOVAL PORT-A-CATH;  Surgeon: Coralie Keens, MD;  Location: [place];  Service: General;  Laterality: Left;   Patient Active Problem List   Diagnosis Date Noted   Port-A-Cath in place 11/09/2019   Genetic testing 10/14/2019   Malignant neoplasm of upper-outer quadrant of right breast in female, estrogen receptor positive ([place]) 10/05/2019     REFERRING DIAG:  C50.411 (ICD-10-CM) - Malignant neoplasm of upper-outer quadrant of right female breast Z17.0 (ICD-10-CM) - Estrogen receptor positive status (ER+)  THERAPY DIAG:  Chronic right shoulder pain  Muscle weakness (generalized)  PERTINENT HISTORY:  Pt presents to PT with reports of R shoulder pain s/p R breast reconstruction on 05/29/22  PRECAUTIONS: None  SUBJECTIVE: Pt reports pain with HEP and with abducting her arm rated at 5/10  PAIN:  Are you having pain? No NPRS scale: 0/10, 5/10 with movement  Pain location: N/A PAIN TYPE: tight Pain description: intermittent  Aggravating factors: dressing Relieving factors: medication    OBJECTIVE:  UPPER EXTREMITY AROM/PROM:   A/PROM Right 06/28/2021 Left 06/28/2021 Right  07/09/21  Shoulder flexion 70 WFL 118/PROM 120   Shoulder abduction 72 WFL 100(scaption)/PROM 85  Shoulder internal rotation 25 WFL Reach to buttock/ PROM 50  Shoulder external rotation 40 WFL PROM 40  (Blank rows = not tested)   UPPER EXTREMITY MMT:   MMT Right 06/28/2021 Left 06/28/2021  Shoulder flexion 2+/5 [facility]  Shoulder abduction 2+/5 [facility]  Shoulder IR 2+/5 [facility]  Shoulder ER 2+/5 WFL  Grip strength (lbs)      (Blank rows = not tested)     TODAY'S TREATMENT: Therapeutic Exercise: Passive ROM flexion, abduction, ER, IR Supine cane pressups and pullovers with dowel x 10  each Supine R shoulder ER AAROM w/ dow  x 10 - 5" Supine horizontal abd 2x10 RTB  Scap retract x 10   Corner stretch 2x30" Standing wooden dowel AAROM scaption x 10 to 90 UBE lvl 1.0 x 4 min (64fwd/2bwd) Pulleys R shoulder flex x 2 min - 5" hold Pulleys R scaption x 1 minute  Row 3x10 - 3" hold red band     PATIENT EDUCATION: Education details: continue HEP Person educated: Patient Education method: Consulting civil engineer, Demonstration, and Handouts Education comprehension: verbalized understanding and returned demonstration     HOME EXERCISE PROGRAM: Cdigo de  acceso: [email or web address]   ASSESSMENT: Pt was able to complete prescribed exercises with min increased pain with AAROM abduction. She has increased pain with all end range passive ROM. Therapy today focused on improving R shoulder ROM and AAROM as well as periscapular strength in order to decrease pain and improve functional ability with ADLs. Her shoulder flexion ROM has improved.  She continues to benefit from skilled PT services and will continue to be seen and progressed as tolerated.      GOALS: Goals reviewed with patient? No   SHORT TERM GOALS:   STG Name Target Date Goal status  1 Pt will be compliant and knowledgeable with initial HEP for improved comfort and carryover  Baseline: initial HEP given 07/19/2021 INITIAL  2 Pt will self report R shoulder/axilla pain no greater than 6/10 for improved comfort and functional ability Baseline: 8/10 at worst 07/19/2021 INITIAL    LONG TERM GOALS:    LTG Name Target Date Goal status  1 Pt will decrease Quick DASH disability score to no greater than 50% as proxy for functional improvement Baseline: 80% disability  08/23/2021 INITIAL  2 Pt will self report R shoulder/axilla pain no greater than 2/10 for improved comfort and functional ability Baseline: 8/10 at worst 08/23/2021 INITIAL  3 Pt will improve R shoulder flex to no less than 140 degrees in order to reach into cabinets and improve functional ability Baseline: 08/23/2021 INITIAL  4 Pt will improve R shoulder MMT to no less than 4/5 for improved functional ability with ADLs Baseline:  MMT Right 06/28/2021 Left 06/28/2021  Shoulder flexion 2+/5 [facility]  Shoulder abduction 2+/5 [facility]  Shoulder IR 2+/5 [facility]  Shoulder ER 2+/5 [facility]  Grip strength (lbs)      (Blank rows = not tested) 08/23/2021 INITIAL    PLAN: PT FREQUENCY: 2x/week   PT DURATION: 8 weeks   PLANNED INTERVENTIONS: Therapeutic exercises, Therapeutic activity, Neuro Muscular re-education, Balance training, Gait training, Patient/Family  education, Joint mobilization, Cryotherapy, Moist heat, Taping, Vasopneumatic device, and Manual therapy   PLAN FOR NEXT SESSION: assess response to HEP, progress periscapular strength, PROM to R shoulder, progress Celestia Khat, PTA 07/09/21 10:12 AM Phone: [phone] Fax: [phone]

## 2021-07-11 ENCOUNTER — Other Ambulatory Visit: Payer: Self-pay

## 2021-07-11 ENCOUNTER — Ambulatory Visit (INDEPENDENT_AMBULATORY_CARE_PROVIDER_SITE_OTHER): Payer: Medicaid Other | Admitting: Plastic Surgery

## 2021-07-11 DIAGNOSIS — C50411 Malignant neoplasm of upper-outer quadrant of right female breast: Secondary | ICD-10-CM

## 2021-07-11 DIAGNOSIS — Z170 Estrogen receptor positive status [ER+]: Secondary | ICD-10-CM

## 2021-07-11 DIAGNOSIS — Z419 Encounter for procedure for purposes other than remedying health state, unspecified: Secondary | ICD-10-CM | POA: Diagnosis not present

## 2021-07-11 NOTE — Progress Notes (Signed)
Patient presents in follow-up for right breast reconstruction.  She currently has 300 cc and a 500 cc expander.  150 cc was added today for current volume of 450 cc and a 500 cc expander.  This is shaping up to be a nice result for her.  Her shoulder is getting better with physical therapy.  We will plan to see her again in another few weeks to potentially do a little bit more feeling in the right side.  After that we will be ready to do an implant exchange and left-sided reduction.  She is fully understanding and all of her questions were answered.

## 2021-07-16 ENCOUNTER — Ambulatory Visit: Payer: Medicaid Other | Admitting: Physical Therapy

## 2021-07-18 IMAGING — CT CT ABD-PELV W/ CM
2 of 5 series · 13 of 36 positions shown, 16 images · IV contrast (omnipaque)
Comparison: No priors.

CLINICAL DATA: 43-year-old female with history of right-sided
breast cancer diagnosed in September 2019.

EXAM:
CT CHEST, ABDOMEN, AND PELVIS WITH CONTRAST
TECHNIQUE: Multidetector CT imaging of the chest, abdomen and pelvis was
performed following the standard protocol during bolus
administration of intravenous contrast.
CONTRAST:  100mL OMNIPAQUE IOHEXOL 300 MG/ML  SOLN

[Series 2: cap with · axial · 0.84mm/px · z∈[-570,-64]mm · 10 of 125 slices shown, 13 images]
[im 12/125  mediastinal]
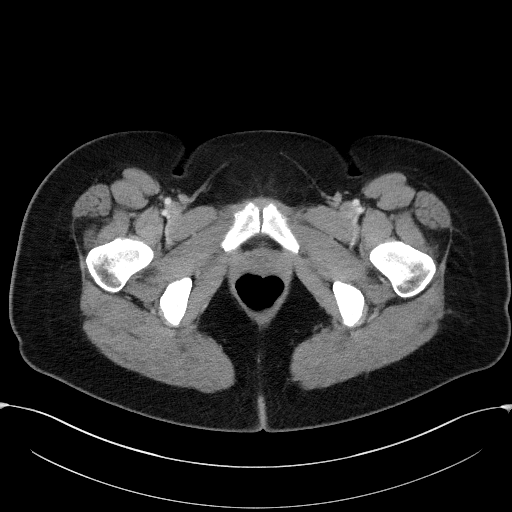
[im 12/125  lung]
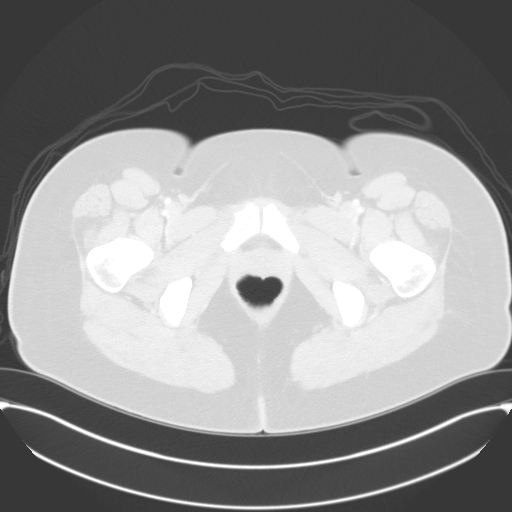
[im 23/125  lung]
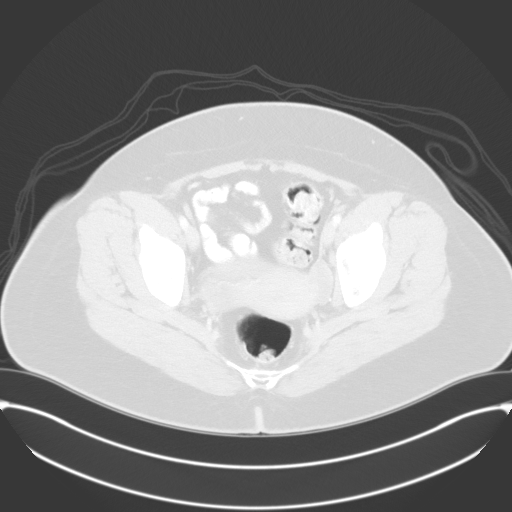
[im 34/125  lung]
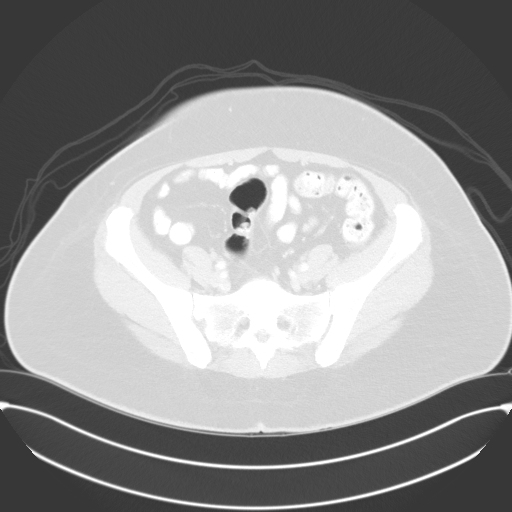
[im 46/125  lung]
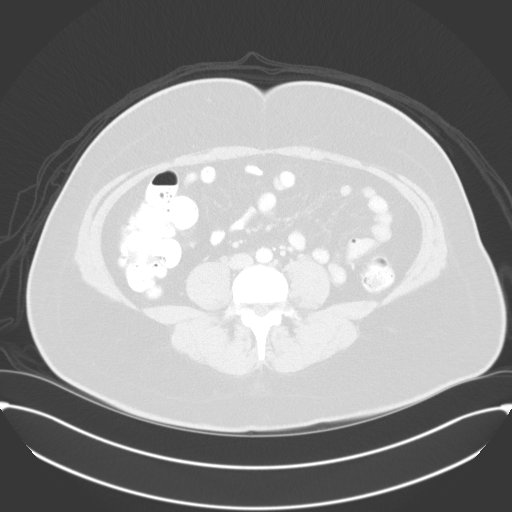
[im 57/125  mediastinal]
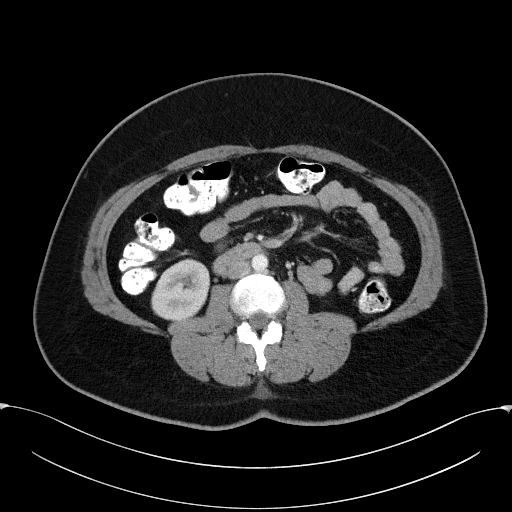
[im 57/125  lung]
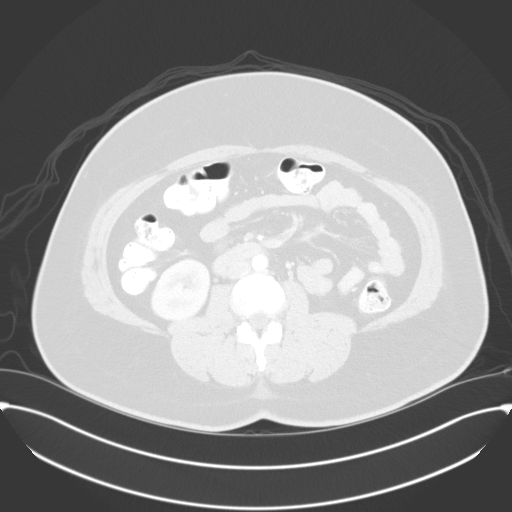
[im 68/125  lung]
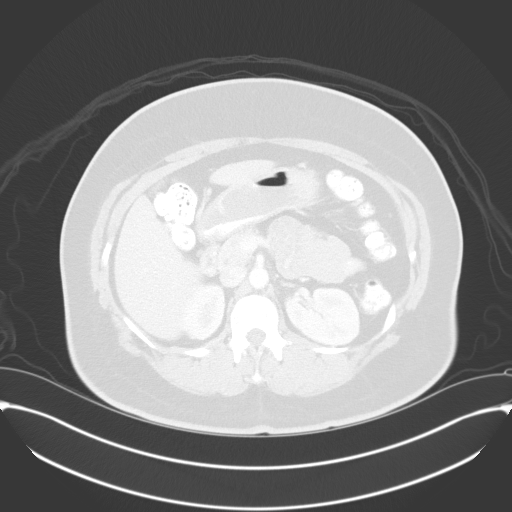
[im 79/125  lung]
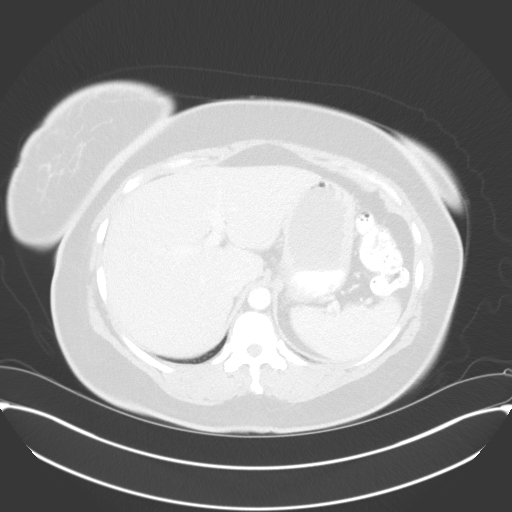
[im 91/125  lung]
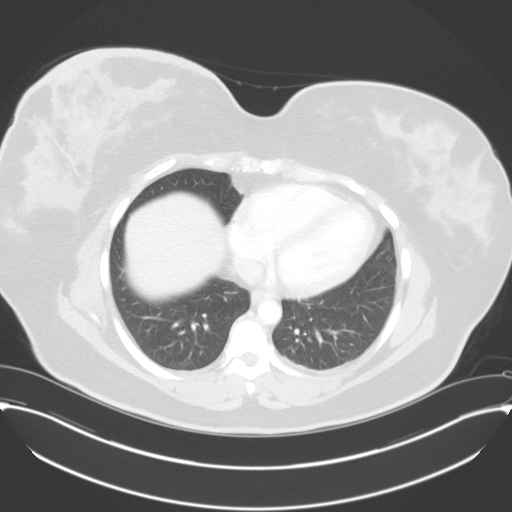
[im 102/125  mediastinal]
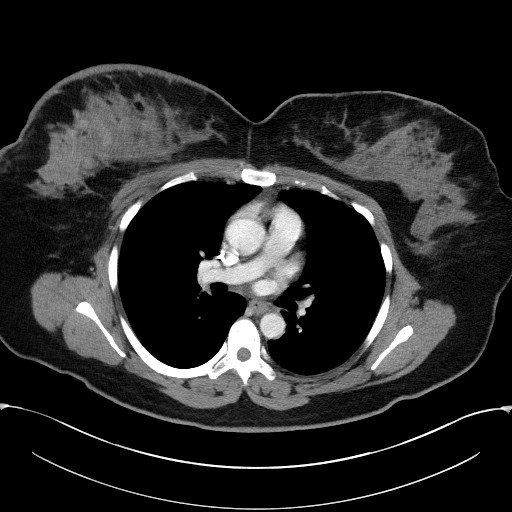
[im 102/125  lung]
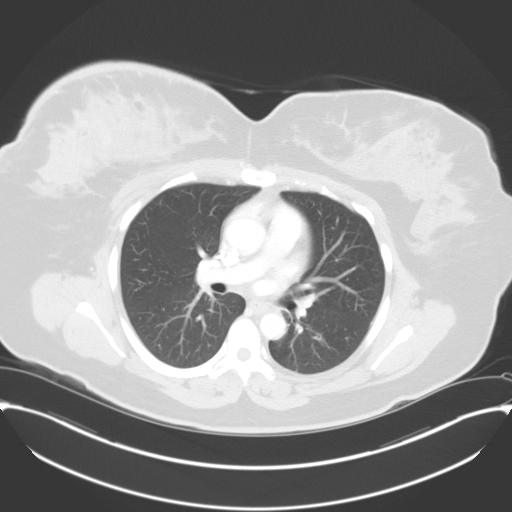
[im 113/125  lung]
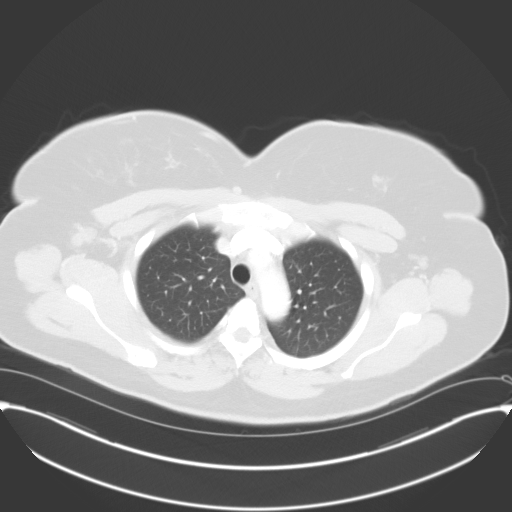

[Series 5: coronals · coronal · 0.82mm/px · 3 of 169 slices shown]
[im 34/169  lung]
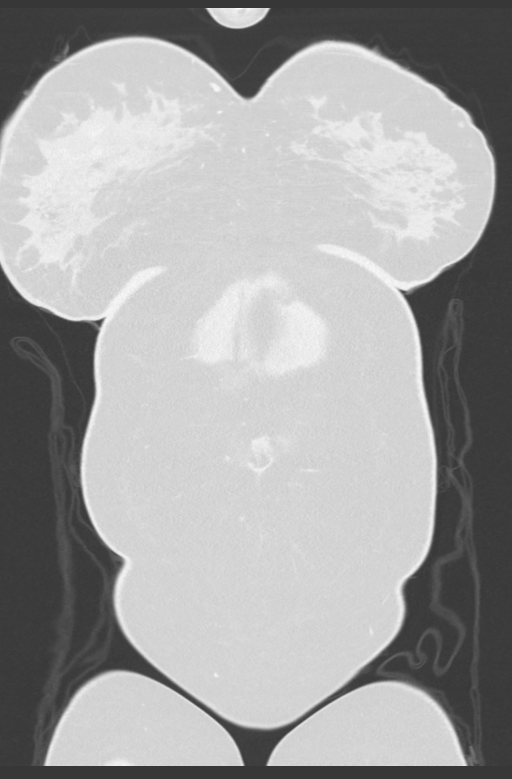
[im 68/169  lung]
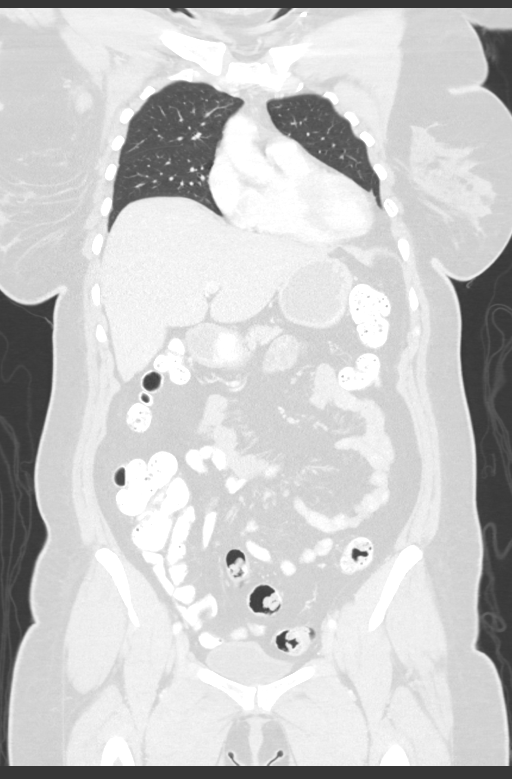
[im 101/169  lung]
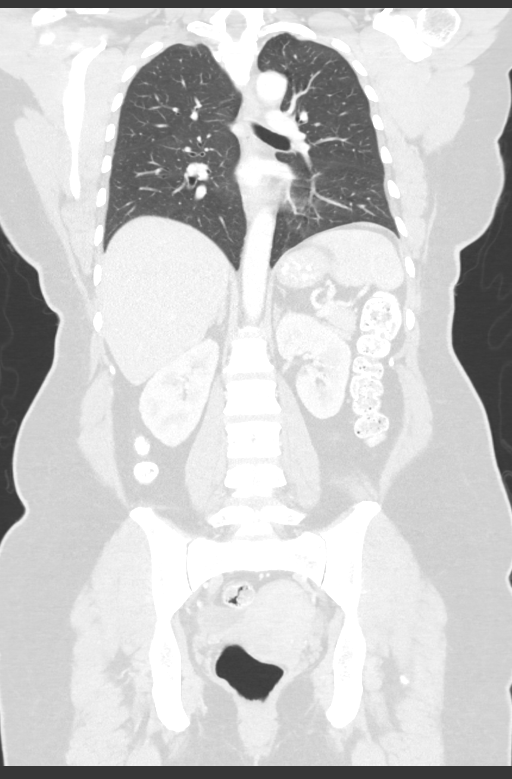

[13 of 36 positions shown; findings below may reference images not displayed]

FINDINGS: CT CHEST FINDINGS

Cardiovascular: Heart size is normal. There is no significant
pericardial fluid, thickening or pericardial calcification. No
atherosclerotic calcifications in the thoracic aorta or the coronary
arteries. Left-sided subclavian single-lumen porta cath with tip
terminating right atrium.

Mediastinum/Nodes: No pathologically enlarged mediastinal or hilar
lymph nodes. Esophagus is unremarkable in appearance. No internal
mammary lymphadenopathy. Numerous enlarged and borderline enlarged
right axillary and subpectoral lymph nodes are noted, measuring up
to 1.8 cm in short axis (axial image 15 of series 2).

Lungs/Pleura: Tiny 2 mm calcified granuloma in the periphery of the
right upper lobe. No other suspicious appearing pulmonary nodules or
masses are noted. No acute consolidative airspace disease. No
pleural effusions.

Musculoskeletal: Diffuse skin thickening in the right breast. Poorly
defined infiltrative mass in the right breast estimated to measure
approximately 6.9 x 4.0 x 3.9 cm (axial image 26 of series 2 and
coronal image 45 of series 5). There are no aggressive appearing
lytic or blastic lesions noted in the visualized portions of the
skeleton.

CT ABDOMEN PELVIS FINDINGS

Hepatobiliary: No suspicious cystic or solid hepatic lesions. No
intra or extrahepatic biliary ductal dilatation. Status post
cholecystectomy.

Pancreas: No pancreatic mass. No pancreatic ductal dilatation. No
pancreatic or peripancreatic fluid collections or inflammatory
changes.

Spleen: Unremarkable.

Adrenals/Urinary Tract: Bilateral kidneys and adrenal glands are
normal in appearance. No hydroureteronephrosis. Urinary bladder is
normal in appearance.

Stomach/Bowel: Normal appearance of the stomach. No pathologic
dilatation of small bowel or colon. Normal appendix.

Vascular/Lymphatic: No significant atherosclerotic disease, aneurysm
or dissection noted in the abdominal or pelvic vasculature. No
lymphadenopathy noted in the abdomen or pelvis.

Reproductive: Uterus and ovaries are unremarkable in appearance.

Other: No significant volume of ascites.  No pneumoperitoneum.

Musculoskeletal: There are no aggressive appearing lytic or blastic
lesions noted in the visualized portions of the skeleton.
IMPRESSION: 1. Large heterogeneously enhancing poorly defined infiltrative right
breast mass with diffuse skin thickening in the right breast, and
right axillary/subpectoral lymphadenopathy.
2. No other sites of metastatic disease noted elsewhere in the
chest, abdomen or pelvis.

## 2021-07-23 ENCOUNTER — Other Ambulatory Visit: Payer: Self-pay

## 2021-07-23 ENCOUNTER — Ambulatory Visit: Payer: Medicaid Other | Attending: Plastic Surgery

## 2021-07-23 DIAGNOSIS — M6281 Muscle weakness (generalized): Secondary | ICD-10-CM | POA: Insufficient documentation

## 2021-07-23 DIAGNOSIS — G8929 Other chronic pain: Secondary | ICD-10-CM | POA: Diagnosis not present

## 2021-07-23 DIAGNOSIS — M25511 Pain in right shoulder: Secondary | ICD-10-CM | POA: Insufficient documentation

## 2021-07-23 NOTE — Therapy (Signed)
OUTPATIENT PHYSICAL THERAPY TREATMENT NOTE   Patient Name: Alice [name] MRN: [id number] DOB:December 03, 1975, 46 y.o., female Today's Date: 07/23/2021  PCP: Drue Flirt, MD REFERRING PROVIDER: Drue Flirt, MD   PT End of Session - 07/23/21 [phone]     Visit Number 4    Number of Visits 17    Date for PT Re-Evaluation 08/23/21    Authorization Type [place] [facility]    Authorization Time Period 07/02/21-08/31/21    Authorization - Visit Number 4    Authorization - Number of Visits 12    PT Start Time 1000    PT Stop Time 1040    PT Time Calculation (min) 40 min    Activity Tolerance Patient limited by pain    Behavior During Therapy [facility] for tasks assessed/performed             Past Medical History:  Diagnosis Date   Anemia 11/29/2020   Breast cancer ([place]) 2021   Right   History of chemotherapy    FINISHED CHEMO MAY 2022   Hypertension 11/29/2020   Past Surgical History:  Procedure Laterality Date   BREAST BIOPSY     BREAST RECONSTRUCTION WITH PLACEMENT OF TISSUE EXPANDER AND FLEX HD (ACELLULAR HYDRATED DERMIS) Right 05/29/2021   Procedure: Right breast reconstruction with latissimus flap and tissue expander placement.  Application of acellular dermal matrix.;  Surgeon: Cindra Presume, MD;  Location: [place];  Service: Plastics;  Laterality: Right;   CHOLECYSTECTOMY     more than 10 yrs ago per pt on 11-29-2020, laparoscopic   IR IMAGING GUIDED PORT INSERTION  10/13/2019   MASTECTOMY MODIFIED RADICAL Right 03/06/2020   Procedure: MASTECTOMY MODIFIED RADICAL;  Surgeon: Alphonsa Overall, MD;  Location: [facility];  Service: General;  Laterality: Right;  RNFA   PORT-A-CATH REMOVAL Left 12/04/2020   Procedure: REMOVAL PORT-A-CATH;  Surgeon: Coralie Keens, MD;  Location: [place];  Service: General;  Laterality: Left;   Patient Active Problem List   Diagnosis Date Noted   Port-A-Cath in place 11/09/2019   Genetic testing 10/14/2019   Malignant  neoplasm of upper-outer quadrant of right breast in female, estrogen receptor positive ([place]) 10/05/2019    REFERRING DIAG:  C50.411 (ICD-10-CM) - Malignant neoplasm of upper-outer quadrant of right female breast Z17.0 (ICD-10-CM) - Estrogen receptor positive status (ER+)  THERAPY DIAG:  Chronic right shoulder pain  Muscle weakness (generalized)  PERTINENT HISTORY:  Pt presents to PT with reports of R shoulder pain s/p R breast reconstruction on 05/29/22  PRECAUTIONS: None  SUBJECTIVE:  Pt presents to PT with reports of continued R shoulder pain with movement. Has been compliant with HEP. Is ready to begin PT at this time.   PAIN:  Are you having pain? No NPRS scale: 0/10, 7/10 with movement  Pain location: N/A PAIN TYPE: tight Pain description: intermittent  Aggravating factors: dressing Relieving factors: medication    OBJECTIVE:  UPPER EXTREMITY AROM/PROM:   A/PROM Right 06/28/2021 Right  07/09/21 Right 07/23/2021  Shoulder flexion 70 118/PROM 120  115 deg AROM  Shoulder abduction 72 100(scaption)/PROM 85 100 deg AROM  Shoulder internal rotation 25 Reach to buttock/ PROM 50 50 deg  PROM  Shoulder external rotation 40 PROM 40 30 deg  PROM  (Blank rows = not tested)   UPPER EXTREMITY MMT:   MMT Right 06/28/2021 Left 06/28/2021  Shoulder flexion 2+/5 [facility]  Shoulder abduction 2+/5 [facility]  Shoulder IR 2+/5 [facility]  Shoulder ER 2+/5 [facility]  Grip  strength (lbs)      (Blank rows = not tested)     TODAY'S TREATMENT: Therapeutic Exercise: UBE lvl 1.0 x 4 min (37fwd/2bwd) while taking subjective Supine pball flexion bilat Ues x 10 Supine cane pressups and pullovers with dowel x 10  each Supine R shoulder ER AAROM w/ dow x 10 - 5" Supine horizontal abd 2x10 RTB  Scap retract x 10   Corner stretch 2x30" Standing wooden dowel AAROM scaption x 10 to 90 Pulleys R shoulder flex x 2 min - 5" hold Pulleys R scaption x 1 minute  Row 3x10 - 3" hold 13# Seated row 2x10 25# Wall  walk 2x5 - 5" hold R shoulder flexion   PATIENT EDUCATION: Education details: continue HEP Person educated: Patient Education method: Consulting civil engineer, Demonstration, and Handouts Education comprehension: verbalized understanding and returned demonstration     HOME EXERCISE PROGRAM: Cdigo de acceso: [id number]   ASSESSMENT: Pt was able to complete all prescribed exercises with no adverse effect, but continues to have significant pain during movement of R shoulder. Pt has progressed with therapy, continuing to demonstrate improvement in R shoulder ROM, although she continues to be limited by pain. She continues to benefit from skilled PT services and should continue to be seen and progressed as tolerated.      GOALS: Goals reviewed with patient? No   SHORT TERM GOALS:   STG Name Target Date Goal status  1 Pt will be compliant and knowledgeable with initial HEP for improved comfort and carryover  Baseline: initial HEP given 07/19/2021 INITIAL  2 Pt will self report R shoulder/axilla pain no greater than 6/10 for improved comfort and functional ability Baseline: 8/10 at worst 07/19/2021 INITIAL    LONG TERM GOALS:    LTG Name Target Date Goal status  1 Pt will decrease Quick DASH disability score to no greater than 50% as proxy for functional improvement Baseline: 80% disability  08/23/2021 INITIAL  2 Pt will self report R shoulder/axilla pain no greater than 2/10 for improved comfort and functional ability Baseline: 8/10 at worst 08/23/2021 INITIAL  3 Pt will improve R shoulder flex to no less than 140 degrees in order to reach into cabinets and improve functional ability Baseline: 08/23/2021 INITIAL  4 Pt will improve R shoulder MMT to no less than 4/5 for improved functional ability with ADLs Baseline:  MMT Right 06/28/2021 Left 06/28/2021  Shoulder flexion 2+/5 [facility]  Shoulder abduction 2+/5 [facility]  Shoulder IR 2+/5 [facility]  Shoulder ER 2+/5 [facility]  Grip strength (lbs)      (Blank rows = not  tested) 08/23/2021 INITIAL    PLAN: PT FREQUENCY: 2x/week   PT DURATION: 8 weeks   PLANNED INTERVENTIONS: Therapeutic exercises, Therapeutic activity, Neuro Muscular re-education, Balance training, Gait training, Patient/Family education, Joint mobilization, Cryotherapy, Moist heat, Taping, Vasopneumatic device, and Manual therapy   PLAN FOR NEXT SESSION: assess response to HEP, progress periscapular strength, PROM to R shoulder, progress Pedro Earls, PT 07/23/21 10:41 AM

## 2021-07-25 ENCOUNTER — Ambulatory Visit (INDEPENDENT_AMBULATORY_CARE_PROVIDER_SITE_OTHER): Payer: Medicaid Other | Admitting: Plastic Surgery

## 2021-07-25 ENCOUNTER — Other Ambulatory Visit: Payer: Self-pay

## 2021-07-25 ENCOUNTER — Encounter: Payer: Self-pay | Admitting: Oncology

## 2021-07-25 DIAGNOSIS — C50411 Malignant neoplasm of upper-outer quadrant of right female breast: Secondary | ICD-10-CM

## 2021-07-25 DIAGNOSIS — Z170 Estrogen receptor positive status [ER+]: Secondary | ICD-10-CM

## 2021-07-25 NOTE — Progress Notes (Signed)
Patient presents for continued expansion of her right breast reconstruction.  She currently has 450 cc and a 500 cc expander.  Today I infiltrated the 150 cc for current volume of 600 cc in her 500 cc expander.  This looks to be about as big as I can reasonably get the right side.  She is still significantly larger on the left side.  Today we discussed doing an implant exchange by removing the implant on the right and replacing it with a gel implant and also doing a breast reduction on the left side with free nipple graft.  I think this would dramatically improve her symmetry.  We did talk through the nuances of the free nipple graft but I think in her case given how large her left side is that I would have much more control over the shape and ultimately give a better outcome in that regard.  I did discuss the downsides of the free nipple graft that would include the color change and loss of sensation which did not matter significantly to her.  We will plan to go ahead and get this scheduled for her.  All of her questions were answered.

## 2021-07-27 ENCOUNTER — Telehealth: Payer: Self-pay | Admitting: Plastic Surgery

## 2021-07-27 NOTE — Telephone Encounter (Signed)
LVM using interpreter services. Requested call back to schedule surgery.

## 2021-07-30 ENCOUNTER — Other Ambulatory Visit: Payer: Self-pay

## 2021-07-30 ENCOUNTER — Ambulatory Visit: Payer: Medicaid Other

## 2021-07-30 DIAGNOSIS — M6281 Muscle weakness (generalized): Secondary | ICD-10-CM

## 2021-07-30 DIAGNOSIS — G8929 Other chronic pain: Secondary | ICD-10-CM | POA: Diagnosis not present

## 2021-07-30 DIAGNOSIS — M25511 Pain in right shoulder: Secondary | ICD-10-CM | POA: Diagnosis not present

## 2021-07-30 NOTE — Therapy (Signed)
OUTPATIENT PHYSICAL THERAPY TREATMENT NOTE   Patient Name: Alice [name] MRN: [id number] DOB:April 30, 1976, 46 y.o., female Today's Date: 07/30/2021  PCP: Drue Flirt, MD REFERRING PROVIDER: Drue Flirt, MD   PT End of Session - 07/30/21 1041     Visit Number 5    Number of Visits 17    Date for PT Re-Evaluation 08/23/21    Authorization Type [place] [facility]    Authorization Time Period 07/02/21-08/31/21    Authorization - Visit Number 5    Authorization - Number of Visits 12    PT Start Time 5366    PT Stop Time 4403    PT Time Calculation (min) 43 min    Activity Tolerance Patient limited by pain    Behavior During Therapy [facility] for tasks assessed/performed              Past Medical History:  Diagnosis Date   Anemia 11/29/2020   Breast cancer ([place]) 2021   Right   History of chemotherapy    FINISHED CHEMO MAY 2022   Hypertension 11/29/2020   Past Surgical History:  Procedure Laterality Date   BREAST BIOPSY     BREAST RECONSTRUCTION WITH PLACEMENT OF TISSUE EXPANDER AND FLEX HD (ACELLULAR HYDRATED DERMIS) Right 05/29/2021   Procedure: Right breast reconstruction with latissimus flap and tissue expander placement.  Application of acellular dermal matrix.;  Surgeon: Cindra Presume, MD;  Location: [place];  Service: Plastics;  Laterality: Right;   CHOLECYSTECTOMY     more than 10 yrs ago per pt on 11-29-2020, laparoscopic   IR IMAGING GUIDED PORT INSERTION  10/13/2019   MASTECTOMY MODIFIED RADICAL Right 03/06/2020   Procedure: MASTECTOMY MODIFIED RADICAL;  Surgeon: Alphonsa Overall, MD;  [facility];  Service: General;  Laterality: Right;  RNFA   PORT-A-CATH REMOVAL Left 12/04/2020   Procedure: REMOVAL PORT-A-CATH;  Surgeon: Coralie Keens, MD;  Location: [place];  Service: General;  Laterality: Left;   Patient Active Problem List   Diagnosis Date Noted   Port-A-Cath in place 11/09/2019   Genetic testing 10/14/2019   Malignant  neoplasm of upper-outer quadrant of right breast in female, estrogen receptor positive ([place]) 10/05/2019    REFERRING DIAG:  C50.411 (ICD-10-CM) - Malignant neoplasm of upper-outer quadrant of right female breast Z17.0 (ICD-10-CM) - Estrogen receptor positive status (ER+)  THERAPY DIAG:  Chronic right shoulder pain  Muscle weakness (generalized)  PERTINENT HISTORY:  Pt presents to PT with reports of R shoulder pain s/p R breast reconstruction on 05/29/22  PRECAUTIONS: None  SUBJECTIVE:  Pt presents to PT with continued R shoulder pain with movement. Has been compliant with her HEP with no adverse effect. Has upcoming surgery on 08/17/2021. Is ready to begin PT at this time.   PAIN:  Are you having pain? No NPRS scale: 0/10, 7/10 with movement  Pain location: N/A PAIN TYPE: tight Pain description: intermittent  Aggravating factors: dressing Relieving factors: medication    OBJECTIVE:  UPPER EXTREMITY AROM/PROM:   A/PROM Right 06/28/2021 Right  07/09/21 Right 07/23/2021 Right 07/30/2021  Shoulder flexion 70 118/PROM 120  115 deg AROM 115 AROM  Shoulder abduction 72 100(scaption)/PROM 85 100 deg AROM   Shoulder internal rotation 25 Reach to buttock/ PROM 50 50 deg  PROM   Shoulder external rotation 40 PROM 40 30 deg  PROM   (Blank rows = not tested)   UPPER EXTREMITY MMT:   MMT Right 06/28/2021 Left 06/28/2021  Shoulder flexion 2+/5 [facility]  Shoulder abduction 2+/5 [facility]  Shoulder IR 2+/5 [facility]  Shoulder ER 2+/5 [facility]  Grip strength (lbs)      (Blank rows = not tested)     TODAY'S TREATMENT: Therapeutic Exercise: UBE lvl 1.0 x 4 min (41fwd/2bwd) while taking subjective Supine pball flexion bilat UE x 10 Supine cane pressups and pullovers with dowel x 10  each Supine horizontal abd 2x10 RTB Seated R shoulder ER AAROM w/ dow 2x10 - 5" Corner stretch 2x20" Pulleys R shoulder flex x 2 min - 5" hold Pulleys R scaption x 1 min Seated row 2x10 25# Wall walk 2x5 - 5" hold R  shoulder flexion Wall slide abd x 10 - R shoulder    PATIENT EDUCATION: Education details: continue HEP Person educated: Patient Education method: Consulting civil engineer, Demonstration, and Handouts Education comprehension: verbalized understanding and returned demonstration     HOME EXERCISE PROGRAM: Cdigo de acceso: [id number]   ASSESSMENT: Pt tolerated treatment fair, but continued to be limited by R shoulder pain with all movements. Therapy today worked on increasing R shoulder ROM and periscapular strength in order to decrease pain and improve functional ability. She will be having surgery on 08/17/2021 for additional right breast reconstruction, as well as left breast reduction. PT will hold sessions that were originally scheduled after that date. Will otherwise continue to try and progress as tolerated per POC.      GOALS: Goals reviewed with patient? No   SHORT TERM GOALS:   STG Name Target Date Goal status  1 Pt will be compliant and knowledgeable with initial HEP for improved comfort and carryover  Baseline: initial HEP given 07/19/2021 MET  2 Pt will self report R shoulder/axilla pain no greater than 6/10 for improved comfort and functional ability Baseline: 8/10 at worst 07/19/2021 ONGOING    LONG TERM GOALS:    LTG Name Target Date Goal status  1 Pt will decrease Quick DASH disability score to no greater than 50% as proxy for functional improvement Baseline: 80% disability  08/23/2021 ONGOING  2 Pt will self report R shoulder/axilla pain no greater than 2/10 for improved comfort and functional ability Baseline: 8/10 at worst 08/23/2021 ONGOING  3 Pt will improve R shoulder flex to no less than 140 degrees in order to reach into cabinets and improve functional ability Baseline: 08/23/2021 ONGOING  4 Pt will improve R shoulder MMT to no less than 4/5 for improved functional ability with ADLs Baseline:  MMT Right 06/28/2021 Left 06/28/2021  Shoulder flexion 2+/5 [facility]  Shoulder abduction  2+/5 [facility]  Shoulder IR 2+/5 [facility]  Shoulder ER 2+/5 [facility]  Grip strength (lbs)      (Blank rows = not tested) 08/23/2021 ONGOING    PLAN: PT FREQUENCY: 2x/week   PT DURATION: 8 weeks   PLANNED INTERVENTIONS: Therapeutic exercises, Therapeutic activity, Neuro Muscular re-education, Balance training, Gait training, Patient/Family education, Joint mobilization, Cryotherapy, Moist heat, Taping, Vasopneumatic device, and Manual therapy   PLAN FOR NEXT SESSION: assess response to HEP, progress periscapular strength, PROM to R shoulder, progress Pedro Earls, PT 07/30/21 11:37 AM

## 2021-08-06 ENCOUNTER — Ambulatory Visit: Payer: Medicaid Other

## 2021-08-06 ENCOUNTER — Telehealth: Payer: Self-pay

## 2021-08-06 NOTE — H&P (View-Only) (Signed)
Patient ID: Alice [name], female    DOB: 1976/03/09, 46 y.o.   MRN: [id number]  Chief Complaint  Patient presents with   Pre-op Exam      ICD-10-CM   1. Malignant neoplasm of upper-outer quadrant of right breast in female, estrogen receptor positive ([place])  C50.411    Z17.0        History of Present Illness: Alice [name] is a 46 y.o.  female  with a history of right-sided breast cancer s/p mastectomy with latissimus flap reconstruction and expander placement.  She presents for preoperative evaluation for upcoming procedure, removal of right expander with implant exchange as well as left-sided oncoplastic breast reduction and free nipple graft, scheduled for 08/17/2021 with Dr. Claudia Desanctis.  The patient has not had problems with anesthesia.  Patient takes lisinopril for high blood pressure, no other medications.  Specifically denies any blood thinners.  No personal family history of blood clots or clotting disorder.  Denies any recent hospitalizations or infections.  Her port was removed 2 months ago.  She feels comfortable with postoperative support at home, citing her daughters.  She does not smoke tobacco.  Denies tape dermatitis.  Patient will have a likely left-sided Penrose drain placed.  Doubt that right-sided drain will be necessary.  Summary of Previous Visit: Patient is s/p latissimus flap reconstruction performed 05/29/2021 by Dr. Claudia Desanctis.  She was seen most recently on 07/25/2021.  At that time, expander ejection was performed for a total of 600/500 cc in her expander.  Patient is still significantly larger on the left side.  Discussed right-sided implant exchange with left-sided oncoplastic breast reduction.  They discussed that it would require free nipple graft as it would provide more control for shape and improved cosmesis.  Patient expressed understanding and was agreeable to the plan.  PMH Significant for: Right-sided breast cancer s/p radiation, mastectomy, and latissimus  flap reconstruction 05/29/2021.     Past Medical History: Allergies: No Known Allergies  Current Medications:  Current Outpatient Medications:    lisinopril (ZESTRIL) 10 MG tablet, Take 1 tablet (10 mg total) by mouth daily., Disp: 90 tablet, Rfl: 4  Past Medical Problems: Past Medical History:  Diagnosis Date   Anemia 11/29/2020   Breast cancer ([place]) 2021   Right   History of chemotherapy    FINISHED CHEMO MAY 2022   Hypertension 11/29/2020    Past Surgical History: Past Surgical History:  Procedure Laterality Date   BREAST BIOPSY     BREAST RECONSTRUCTION WITH PLACEMENT OF TISSUE EXPANDER AND FLEX HD (ACELLULAR HYDRATED DERMIS) Right 05/29/2021   Procedure: Right breast reconstruction with latissimus flap and tissue expander placement.  Application of acellular dermal matrix.;  Surgeon: Cindra Presume, MD;  Location: [place];  Service: Plastics;  Laterality: Right;   CHOLECYSTECTOMY     more than 10 yrs ago per pt on 11-29-2020, laparoscopic   IR IMAGING GUIDED PORT INSERTION  10/13/2019   MASTECTOMY MODIFIED RADICAL Right 03/06/2020   Procedure: MASTECTOMY MODIFIED RADICAL;  Surgeon: Alphonsa Overall, MD;  Location: [facility];  Service: General;  Laterality: Right;  RNFA   PORT-A-CATH REMOVAL Left 12/04/2020   Procedure: REMOVAL PORT-A-CATH;  Surgeon: Coralie Keens, MD;  Location: [facility];  Service: General;  Laterality: Left;    Social History: Social History   Socioeconomic History   Marital status: Married    Spouse name: Not on file   Number of children: Not on file   Years of  education: Not on file   Highest education level: Not on file  Occupational History   Not on file  Tobacco Use   Smoking status: Never   Smokeless tobacco: Never  Vaping Use   Vaping Use: Never used  Substance and Sexual Activity   Alcohol use: Never   Drug use: Never   Sexual activity: Not on file  Other Topics Concern   Not on file  Social History Narrative    Not on file   Social Determinants of Health   Financial Resource Strain: Not on file  Food Insecurity: Not on file  Transportation Needs: Not on file  Physical Activity: Not on file  Stress: Not on file  Social Connections: Not on file  Intimate Partner Violence: Not on file    Family History: No family history on file.  Review of Systems: ROS Denied recent illness or infection, trauma, chest pain or difficulty breathing.  Physical Exam: Vital [name] BP (!) 136/95 (BP Location: Left Arm, Patient Position: Sitting, Cuff Size: Large)    Pulse 82    Ht 5\' 6"  (1.676 m)    Wt 211 lb 12.8 oz (96.1 kg)    LMP 09/22/2019    SpO2 98%    BMI 34.19 kg/m   Physical Exam Constitutional:      General: Not in acute distress.    Appearance: Normal appearance. Not ill-appearing.  HENT:     Head: Normocephalic and atraumatic.  Eyes:     Pupils: Pupils are equal, round. Cardiovascular:     Rate and Rhythm: Normal rate.    Pulses: Normal pulses.  Pulmonary:     Effort: No respiratory distress or increased work of breathing.  Speaks in full sentences. Abdominal:     General: Abdomen is flat. No distension.   Musculoskeletal: Normal range of motion. No lower extremity swelling or edema. No varicosities. Skin:    General: Skin is warm and dry.     Findings: No erythema or rash.  Neurological:     Mental Status: Alert and oriented to person, place, and time.  Psychiatric:        Mood and Affect: Mood normal.        Behavior: Behavior normal.    Assessment/Plan: The patient is scheduled for right-sided implant exchange and left-sided oncoplastic reduction with free nipple graft with Dr. Claudia Desanctis.  Risks, benefits, and alternatives of procedure discussed, questions answered and consent obtained.    Smoking Status: Non-smoker. Last Mammogram: 10/2020 unilateral left.  Results: BI-RADS Category 1: Negative.  Caprini Score: 6; Risk Factors include: Age, BMI greater than 25, history of breast  cancer, and length of planned surgery. Recommendation for mechanical prophylaxis. Encourage early ambulation.   Pictures obtained: 08/08/2021.  Post-op Rx sent to pharmacy: Norco, Zofran.  Patient was provided with the General Surgical Risk consent document and Pain Medication Agreement prior to their appointment.  They had adequate time to read through the risk consent documents and Pain Medication Agreement. We also discussed them in person together during this preop appointment. All of their questions were answered to their satisfaction.  Recommended calling if they have any further questions.  Risk consent form and Pain Medication Agreement to be scanned into patient's chart.  The risks that can be encountered with and after placement of a breast implant placement were discussed and include the following but not limited to these: bleeding, infection, delayed healing, anesthesia risks, skin sensation changes, injury to structures including nerves, blood vessels, and muscles which  may be temporary or permanent, allergies to tape, suture materials and glues, blood products, topical preparations or injected agents, skin contour irregularities, skin discoloration and swelling, deep vein thrombosis, cardiac and pulmonary complications, pain, which may persist, fluid accumulation, wrinkling of the skin over the implant, changes in nipple or breast sensation, implant leakage or rupture, faulty position of the implant, persistent pain, formation of tight scar tissue around the implant (capsular contracture), possible need for revisional surgery or staged procedures.  The risk that can be encountered with breast reduction were discussed and include the following but not limited to these:  Breast asymmetry, fluid accumulation, firmness of the breast, inability to breast feed, loss of nipple or areola, skin loss, decrease or no nipple sensation, fat necrosis of the breast tissue, bleeding, infection, healing delay.   There are risks of anesthesia, changes to skin sensation and injury to nerves or blood vessels.  The muscle can be temporarily or permanently injured.  You may have an allergic reaction to tape, suture, glue, blood products which can result in skin discoloration, swelling, pain, skin lesions, poor healing.  Any of these can lead to the need for revisonal surgery or stage procedures.  A reduction has potential to interfere with diagnostic procedures.  Nipple or breast piercing can increase risks of infection.  This procedure is best done when the breast is fully developed.  Changes in the breast will continue to occur over time.  Pregnancy can alter the outcomes of previous breast reduction surgery, weight gain and weigh loss can also effect the long term appearance.   We discussed the possibility of amputation/free nipple graft technique due to the length of her STN.  She is understanding of the possibility that we would need to transition from a pedicle technique to a free nipple graft technique intraoperatively.  We discussed the risks associated with free nipple graft breast reductions, including but not limited to failure of the graft, partial loss of the graft, loss of sensation of bilateral nipple areola, complete loss of the nipple areola graft, inability to breast-feed, postoperative wounds, ongoing wound care.  We also discussed the risks associated with the pedicle technique.  We discussed that with the pedicle technique she could develop nipple areolar necrosis which would result in loss of the nipple, this would also result in ongoing wound care and possible changes in the shape of her breast.    Electronically signed by: Krista Blue, PA-C 08/08/2021 12:13 PM

## 2021-08-06 NOTE — Progress Notes (Signed)
Patient ID: Alice [name], female    DOB: [birth year], 46 y.o.   MRN: [id number]  Chief Complaint  Patient presents with   Pre-op Exam      ICD-10-CM   1. Malignant neoplasm of upper-outer quadrant of right breast in female, estrogen receptor positive ([place])  C50.411    Z17.0        History of Present Illness: Alice [name] is a 46 y.o.  female  with a history of right-sided breast cancer s/p mastectomy with latissimus flap reconstruction and expander placement.  She presents for preoperative evaluation for upcoming procedure, removal of right expander with implant exchange as well as left-sided oncoplastic breast reduction and free nipple graft, scheduled for 08/17/2021 with Dr. Claudia Desanctis.  The patient has not had problems with anesthesia.  Patient takes lisinopril for high blood pressure, no other medications.  Specifically denies any blood thinners.  No personal family history of blood clots or clotting disorder.  Denies any recent hospitalizations or infections.  Her port was removed 2 months ago.  She feels comfortable with postoperative support at home, citing her daughters.  She does not smoke tobacco.  Denies tape dermatitis.  Patient will have a likely left-sided Penrose drain placed.  Doubt that right-sided drain will be necessary.  Summary of Previous Visit: Patient is s/p latissimus flap reconstruction performed 05/29/2021 by Dr. Claudia Desanctis.  She was seen most recently on 07/25/2021.  At that time, expander ejection was performed for a total of 600/500 cc in her expander.  Patient is still significantly larger on the left side.  Discussed right-sided implant exchange with left-sided oncoplastic breast reduction.  They discussed that it would require free nipple graft as it would provide more control for shape and improved cosmesis.  Patient expressed understanding and was agreeable to the plan.  PMH Significant for: Right-sided breast cancer s/p radiation, mastectomy, and latissimus  flap reconstruction 05/29/2021.     Past Medical History: Allergies: No Known Allergies  Current Medications:  Current Outpatient Medications:    lisinopril (ZESTRIL) 10 MG tablet, Take 1 tablet (10 mg total) by mouth daily., Disp: 90 tablet, Rfl: 4  Past Medical Problems: Past Medical History:  Diagnosis Date   Anemia 11/29/2020   Breast cancer ([place]) 2021   Right   History of chemotherapy    FINISHED CHEMO MAY 2022   Hypertension 11/29/2020    Past Surgical History: Past Surgical History:  Procedure Laterality Date   BREAST BIOPSY     BREAST RECONSTRUCTION WITH PLACEMENT OF TISSUE EXPANDER AND FLEX HD (ACELLULAR HYDRATED DERMIS) Right 05/29/2021   Procedure: Right breast reconstruction with latissimus flap and tissue expander placement.  Application of acellular dermal matrix.;  Surgeon: Cindra Presume, MD;  Location: [place];  Service: Plastics;  Laterality: Right;   CHOLECYSTECTOMY     more than 10 yrs ago per pt on 11-29-2020, laparoscopic   IR IMAGING GUIDED PORT INSERTION  10/13/2019   MASTECTOMY MODIFIED RADICAL Right 03/06/2020   Procedure: MASTECTOMY MODIFIED RADICAL;  Surgeon: Alphonsa Overall, MD;  [facility];  Service: General;  Laterality: Right;  RNFA   PORT-A-CATH REMOVAL Left 12/04/2020   Procedure: REMOVAL PORT-A-CATH;  Surgeon: Coralie Keens, MD;  Location: [facility];  Service: General;  Laterality: Left;    Social History: Social History   Socioeconomic History   Marital status: Married    Spouse name: Not on file   Number of children: Not on file   Years of  education: Not on file   Highest education level: Not on file  Occupational History   Not on file  Tobacco Use   Smoking status: Never   Smokeless tobacco: Never  Vaping Use   Vaping Use: Never used  Substance and Sexual Activity   Alcohol use: Never   Drug use: Never   Sexual activity: Not on file  Other Topics Concern   Not on file  Social History Narrative    Not on file   Social Determinants of Health   Financial Resource Strain: Not on file  Food Insecurity: Not on file  Transportation Needs: Not on file  Physical Activity: Not on file  Stress: Not on file  Social Connections: Not on file  Intimate Partner Violence: Not on file    Family History: No family history on file.  Review of Systems: ROS Denied recent illness or infection, trauma, chest pain or difficulty breathing.  Physical Exam: Vital [name] BP (!) 136/95 (BP Location: Left Arm, Patient Position: Sitting, Cuff Size: Large)    Pulse 82    Ht 5\' 6"  (1.676 m)    Wt 211 lb 12.8 oz (96.1 kg)    LMP 09/22/2019    SpO2 98%    BMI 34.19 kg/m   Physical Exam Constitutional:      General: Not in acute distress.    Appearance: Normal appearance. Not ill-appearing.  HENT:     Head: Normocephalic and atraumatic.  Eyes:     Pupils: Pupils are equal, round. Cardiovascular:     Rate and Rhythm: Normal rate.    Pulses: Normal pulses.  Pulmonary:     Effort: No respiratory distress or increased work of breathing.  Speaks in full sentences. Abdominal:     General: Abdomen is flat. No distension.   Musculoskeletal: Normal range of motion. No lower extremity swelling or edema. No varicosities. Skin:    General: Skin is warm and dry.     Findings: No erythema or rash.  Neurological:     Mental Status: Alert and oriented to person, place, and time.  Psychiatric:        Mood and Affect: Mood normal.        Behavior: Behavior normal.    Assessment/Plan: The patient is scheduled for right-sided implant exchange and left-sided oncoplastic reduction with free nipple graft with Dr. Claudia Desanctis.  Risks, benefits, and alternatives of procedure discussed, questions answered and consent obtained.    Smoking Status: Non-smoker. Last Mammogram: 10/2020 unilateral left.  Results: BI-RADS Category 1: Negative.  Caprini Score: 6; Risk Factors include: Age, BMI greater than 25, history of breast  cancer, and length of planned surgery. Recommendation for mechanical prophylaxis. Encourage early ambulation.   Pictures obtained: 08/08/2021.  Post-op Rx sent to pharmacy: Norco, Zofran.  Patient was provided with the General Surgical Risk consent document and Pain Medication Agreement prior to their appointment.  They had adequate time to read through the risk consent documents and Pain Medication Agreement. We also discussed them in person together during this preop appointment. All of their questions were answered to their satisfaction.  Recommended calling if they have any further questions.  Risk consent form and Pain Medication Agreement to be scanned into patient's chart.  The risks that can be encountered with and after placement of a breast implant placement were discussed and include the following but not limited to these: bleeding, infection, delayed healing, anesthesia risks, skin sensation changes, injury to structures including nerves, blood vessels, and muscles which  may be temporary or permanent, allergies to tape, suture materials and glues, blood products, topical preparations or injected agents, skin contour irregularities, skin discoloration and swelling, deep vein thrombosis, cardiac and pulmonary complications, pain, which may persist, fluid accumulation, wrinkling of the skin over the implant, changes in nipple or breast sensation, implant leakage or rupture, faulty position of the implant, persistent pain, formation of tight scar tissue around the implant (capsular contracture), possible need for revisional surgery or staged procedures.  The risk that can be encountered with breast reduction were discussed and include the following but not limited to these:  Breast asymmetry, fluid accumulation, firmness of the breast, inability to breast feed, loss of nipple or areola, skin loss, decrease or no nipple sensation, fat necrosis of the breast tissue, bleeding, infection, healing delay.   There are risks of anesthesia, changes to skin sensation and injury to nerves or blood vessels.  The muscle can be temporarily or permanently injured.  You may have an allergic reaction to tape, suture, glue, blood products which can result in skin discoloration, swelling, pain, skin lesions, poor healing.  Any of these can lead to the need for revisonal surgery or stage procedures.  A reduction has potential to interfere with diagnostic procedures.  Nipple or breast piercing can increase risks of infection.  This procedure is best done when the breast is fully developed.  Changes in the breast will continue to occur over time.  Pregnancy can alter the outcomes of previous breast reduction surgery, weight gain and weigh loss can also effect the long term appearance.   We discussed the possibility of amputation/free nipple graft technique due to the length of her STN.  She is understanding of the possibility that we would need to transition from a pedicle technique to a free nipple graft technique intraoperatively.  We discussed the risks associated with free nipple graft breast reductions, including but not limited to failure of the graft, partial loss of the graft, loss of sensation of bilateral nipple areola, complete loss of the nipple areola graft, inability to breast-feed, postoperative wounds, ongoing wound care.  We also discussed the risks associated with the pedicle technique.  We discussed that with the pedicle technique she could develop nipple areolar necrosis which would result in loss of the nipple, this would also result in ongoing wound care and possible changes in the shape of her breast.    Electronically signed by: Krista Blue, PA-C 08/08/2021 12:13 PM

## 2021-08-06 NOTE — Telephone Encounter (Signed)
PT called with assistance of interpreter Peter Congo [phone] regarding missed visit.   Patient stated she is sick today and could not come in. Reminded her of attendance policy and of next appointment.   Ward Chatters, PT 08/06/21 12:24 PM

## 2021-08-06 NOTE — Therapy (Incomplete)
OUTPATIENT PHYSICAL THERAPY TREATMENT NOTE   Patient Name: Alice [name] MRN: [id number] DOB:December 27, 1975, 46 y.o., female Today's Date: 08/06/2021  PCP: Drue Flirt, MD REFERRING PROVIDER: Drue Flirt, MD      Past Medical History:  Diagnosis Date   Anemia 11/29/2020   Breast cancer ([place]) 2021   Right   History of chemotherapy    FINISHED CHEMO MAY 2022   Hypertension 11/29/2020   Past Surgical History:  Procedure Laterality Date   BREAST BIOPSY     BREAST RECONSTRUCTION WITH PLACEMENT OF TISSUE EXPANDER AND FLEX HD (ACELLULAR HYDRATED DERMIS) Right 05/29/2021   Procedure: Right breast reconstruction with latissimus flap and tissue expander placement.  Application of acellular dermal matrix.;  Surgeon: Cindra Presume, MD;  Location: [place];  Service: Plastics;  Laterality: Right;   CHOLECYSTECTOMY     more than 10 yrs ago per pt on 11-29-2020, laparoscopic   IR IMAGING GUIDED PORT INSERTION  10/13/2019   MASTECTOMY MODIFIED RADICAL Right 03/06/2020   Procedure: MASTECTOMY MODIFIED RADICAL;  Surgeon: Alphonsa Overall, MD;  Location: [facility];  Service: General;  Laterality: Right;  RNFA   PORT-A-CATH REMOVAL Left 12/04/2020   Procedure: REMOVAL PORT-A-CATH;  Surgeon: Coralie Keens, MD;  Location: [facility];  Service: General;  Laterality: Left;   Patient Active Problem List   Diagnosis Date Noted   Port-A-Cath in place 11/09/2019   Genetic testing 10/14/2019   Malignant neoplasm of upper-outer quadrant of right breast in female, estrogen receptor positive ([place]) 10/05/2019    REFERRING DIAG:  C50.411 (ICD-10-CM) - Malignant neoplasm of upper-outer quadrant of right female breast Z17.0 (ICD-10-CM) - Estrogen receptor positive status (ER+)  THERAPY DIAG:  No diagnosis found.  PERTINENT HISTORY:  Pt presents to PT with reports of R shoulder pain s/p R breast reconstruction on 05/29/22  PRECAUTIONS: None  SUBJECTIVE:  ***  PAIN:   Are you having pain? No NPRS scale: 0/10, 7/10 with movement  Pain location: N/A PAIN TYPE: tight Pain description: intermittent  Aggravating factors: dressing Relieving factors: medication    OBJECTIVE:  UPPER EXTREMITY AROM/PROM:   A/PROM Right 06/28/2021 Right  07/09/21 Right 07/23/2021 Right 07/30/2021  Shoulder flexion 70 118/PROM 120  115 deg AROM 115 AROM  Shoulder abduction 72 100(scaption)/PROM 85 100 deg AROM   Shoulder internal rotation 25 Reach to buttock/ PROM 50 50 deg  PROM   Shoulder external rotation 40 PROM 40 30 deg  PROM   (Blank rows = not tested)   UPPER EXTREMITY MMT:   MMT Right 06/28/2021 Left 06/28/2021  Shoulder flexion 2+/5 [facility]  Shoulder abduction 2+/5 [facility]  Shoulder IR 2+/5 [facility]  Shoulder ER 2+/5 WFL  Grip strength (lbs)      (Blank rows = not tested)     TODAY'S TREATMENT: Therapeutic Exercise: UBE lvl 1.0 x 4 min (31fwd/2bwd) while taking subjective Supine pball flexion bilat UE x 10 Supine cane pressups and pullovers with dowel x 10  each Supine horizontal abd 2x10 RTB Seated R shoulder ER AAROM w/ dow 2x10 - 5" Corner stretch 2x20" Pulleys R shoulder flex x 2 min - 5" hold Pulleys R scaption x 1 min Seated row 2x10 25# Wall walk 2x5 - 5" hold R shoulder flexion Wall slide abd x 10 - R shoulder    PATIENT EDUCATION: Education details: continue HEP Person educated: Patient Education method: Explanation, Demonstration, and Handouts Education comprehension: verbalized understanding and returned demonstration     HOME EXERCISE PROGRAM: [place]  de acceso: [email or web address]   ASSESSMENT: ***     GOALS: Goals reviewed with patient? No   SHORT TERM GOALS:   STG Name Target Date Goal status  1 Pt will be compliant and knowledgeable with initial HEP for improved comfort and carryover  Baseline: initial HEP given 07/19/2021 MET  2 Pt will self report R shoulder/axilla pain no greater than 6/10 for improved comfort and functional  ability Baseline: 8/10 at worst 07/19/2021 ONGOING    LONG TERM GOALS:    LTG Name Target Date Goal status  1 Pt will decrease Quick DASH disability score to no greater than 50% as proxy for functional improvement Baseline: 80% disability  08/23/2021 ONGOING  2 Pt will self report R shoulder/axilla pain no greater than 2/10 for improved comfort and functional ability Baseline: 8/10 at worst 08/23/2021 ONGOING  3 Pt will improve R shoulder flex to no less than 140 degrees in order to reach into cabinets and improve functional ability Baseline: 08/23/2021 ONGOING  4 Pt will improve R shoulder MMT to no less than 4/5 for improved functional ability with ADLs Baseline:  MMT Right 06/28/2021 Left 06/28/2021  Shoulder flexion 2+/5 [facility]  Shoulder abduction 2+/5 [facility]  Shoulder IR 2+/5 [facility]  Shoulder ER 2+/5 [facility]  Grip strength (lbs)      (Blank rows = not tested) 08/23/2021 ONGOING    PLAN: PT FREQUENCY: 2x/week   PT DURATION: 8 weeks   PLANNED INTERVENTIONS: Therapeutic exercises, Therapeutic activity, Neuro Muscular re-education, Balance training, Gait training, Patient/Family education, Joint mobilization, Cryotherapy, Moist heat, Taping, Vasopneumatic device, and Manual therapy   PLAN FOR NEXT SESSION: assess response to HEP, progress periscapular strength, PROM to R shoulder, progress Pedro Earls, PT 08/06/21 7:43 AM

## 2021-08-08 ENCOUNTER — Encounter (HOSPITAL_BASED_OUTPATIENT_CLINIC_OR_DEPARTMENT_OTHER): Payer: Self-pay | Admitting: Plastic Surgery

## 2021-08-08 ENCOUNTER — Ambulatory Visit (INDEPENDENT_AMBULATORY_CARE_PROVIDER_SITE_OTHER): Payer: Medicaid Other | Admitting: Physician Assistant

## 2021-08-08 ENCOUNTER — Other Ambulatory Visit: Payer: Self-pay

## 2021-08-08 VITALS — BP 136/95 | HR 82 | Ht 66.0 in | Wt 211.8 lb

## 2021-08-08 DIAGNOSIS — C50411 Malignant neoplasm of upper-outer quadrant of right female breast: Secondary | ICD-10-CM

## 2021-08-08 DIAGNOSIS — I10 Essential (primary) hypertension: Secondary | ICD-10-CM | POA: Insufficient documentation

## 2021-08-08 DIAGNOSIS — Z170 Estrogen receptor positive status [ER+]: Secondary | ICD-10-CM

## 2021-08-08 DIAGNOSIS — Z419 Encounter for procedure for purposes other than remedying health state, unspecified: Secondary | ICD-10-CM | POA: Diagnosis not present

## 2021-08-08 MED ORDER — ONDANSETRON 4 MG PO TBDP: 4.0000 mg | ORAL_TABLET | Freq: Three times a day (TID) | ORAL | 0 refills | Status: AC | PRN

## 2021-08-08 MED ORDER — OXYCODONE HCL 5 MG PO TABS
5.0000 mg | ORAL_TABLET | Freq: Four times a day (QID) | ORAL | 0 refills | Status: AC | PRN
Start: 2021-08-08 — End: 2021-08-13

## 2021-08-13 ENCOUNTER — Other Ambulatory Visit: Payer: Self-pay

## 2021-08-13 ENCOUNTER — Ambulatory Visit: Payer: Medicaid Other | Attending: Plastic Surgery

## 2021-08-13 DIAGNOSIS — M6281 Muscle weakness (generalized): Secondary | ICD-10-CM | POA: Diagnosis not present

## 2021-08-13 DIAGNOSIS — M25511 Pain in right shoulder: Secondary | ICD-10-CM | POA: Insufficient documentation

## 2021-08-13 DIAGNOSIS — G8929 Other chronic pain: Secondary | ICD-10-CM | POA: Diagnosis not present

## 2021-08-13 NOTE — Therapy (Signed)
?OUTPATIENT PHYSICAL THERAPY TREATMENT NOTE/DISCHARGE ? ? ?Patient Name: Alice [name] ?MRN: [id number] ?DOB:05/07/1976, 46 y.o., female ?Today's Date: 08/13/2021 ? ?PCP: Drue Flirt, MD ?REFERRING PROVIDER: Drue Flirt, MD ? ? PT End of Session - 08/13/21 1016   ? ? Visit Number 6   ? Number of Visits 17   ? Date for PT Re-Evaluation 08/23/21   ? Authorization Type  [facility] [employer]   ? Authorization Time Period 07/02/21-08/31/21   ? Authorization - Visit Number 6   ? Authorization - Number of Visits 12   ? PT Start Time 8270   arrived late  ? PT Stop Time 1038   ? PT Time Calculation (min) 23 min   ? Activity Tolerance Patient limited by pain   ? Behavior During Therapy [facility] for tasks assessed/performed   ? ?  ?  ? ?  ? ? ? ? ?Past Medical History:  ?Diagnosis Date  ? Anemia 11/29/2020  ? Breast cancer ([place]) 2021  ? Right  ? History of chemotherapy   ? FINISHED CHEMO MAY 2022  ? Hypertension 11/29/2020  ? ?Past Surgical History:  ?Procedure Laterality Date  ? BREAST BIOPSY    ? BREAST RECONSTRUCTION WITH PLACEMENT OF TISSUE EXPANDER AND FLEX HD (ACELLULAR HYDRATED DERMIS) Right 05/29/2021  ? Procedure: Right breast reconstruction with latissimus flap and tissue expander placement.  Application of acellular dermal matrix.;  Surgeon: Cindra Presume, MD;  Location: [place];  Service: Plastics;  Laterality: Right;  ? CHOLECYSTECTOMY    ? more than 10 yrs ago per pt on 11-29-2020, laparoscopic  ? IR IMAGING GUIDED PORT INSERTION  10/13/2019  ? MASTECTOMY MODIFIED RADICAL Right 03/06/2020  ? Procedure: MASTECTOMY MODIFIED RADICAL;  Surgeon: Alphonsa Overall, MD;  Location: [facility];  Service: General;  Laterality: Right;  RNFA  ? PORT-A-CATH REMOVAL Left 12/04/2020  ? Procedure: REMOVAL PORT-A-CATH;  Surgeon: Coralie Keens, MD;  [facility];  Service: General;  Laterality: Left;  ? ?Patient Active Problem List  ? Diagnosis Date Noted  ? Hypertension 08/08/2021  ? Port-A-Cath in place  11/09/2019  ? Genetic testing 10/14/2019  ? Malignant neoplasm of upper-outer quadrant of right breast in female, estrogen receptor positive ([place]) 10/05/2019  ? ? ?REFERRING DIAG:  ?C50.411 (ICD-10-CM) - Malignant neoplasm of upper-outer quadrant of right female breast ?Z17.0 (ICD-10-CM) - Estrogen receptor positive status (ER+) ? ?THERAPY DIAG:  ?Chronic right shoulder pain ? ?Muscle weakness (generalized) ? ?PERTINENT HISTORY:  ?Pt presents to PT with reports of R shoulder pain s/p R breast reconstruction on 05/29/22 ? ?PRECAUTIONS: None ? ?SUBJECTIVE:  ?Pt presents to PT with continued reports of R shoulder pain and discomfort.  ? ?PAIN:  ?Are you having pain? Yes ?NPRS scale: 8/10 ?Pain location: R shoulder ?PAIN TYPE: tight ?Pain description: intermittent  ?Aggravating factors: dressing ?Relieving factors: medication ? ? ? ?OBJECTIVE:  ?OUTCOMES: ?Quick DASH: 45.5% disability ? ?UPPER EXTREMITY AROM/PROM: ?  ?A/PROM Right ?06/28/2021 Right  ?07/09/21 Right ?07/23/2021 Right ?07/30/2021 Right ?08/13/2021  ?Shoulder flexion 70 118/PROM 120  115 deg AROM 115 ?AROM 115 AROM ?  ?Shoulder abduction 72 100(scaption)/PROM 85 100 deg ?AROM  112 AROM  ?Shoulder internal rotation 25 Reach to buttock/ PROM 50 50 deg  ?PROM  50 deg  ?PROM  ?Shoulder external rotation 40 PROM 40 30 deg  ?PROM  30 deg  ?PROM  ?(Blank rows = not tested) ?  ?UPPER EXTREMITY MMT: ?  ?MMT Right ?06/28/2021 Right ?08/13/2021  ?  Shoulder flexion 2+/5 3/5  ?Shoulder abduction 2+/5 3/5  ?Shoulder IR 2+/5 2+/5  ?Shoulder ER 2+/5 2+/5  ?Grip strength (lbs)      ?(Blank rows = not tested) ?    ?TODAY'S TREATMENT: ?Therapeutic Exercise: ?Scapular retraction x 10 - 3" hold ?Row x 15 GTB ?Corner stretch x 30" ?Review of table slides ?Therapeutic Activity: ?Assessment of tests/measures, goals, and outcomes for discharge assessment ?  ?PATIENT EDUCATION: ?Education details: HEP and discharge ?Person educated: Patient ?Education method: Explanation, Demonstration, and  Handouts ?Education comprehension: verbalized understanding and returned demonstration ?  ?  ?HOME EXERCISE PROGRAM: ?C?digo de acceso: [email or web address] ?  ?ASSESSMENT: ?Pt was able to complete prescribed exercises and demonstrated knowledge of HEP with no adverse effect. Over the course of PT treatment she progressed fair, but continued to be limited by R shoulder pain. Her ROM did improve slightly with flexion/extension compared to evaluation and she shows some improvement in subjective functional ability with decreased quick DASH disability score. Unfortunately, her ROM and strength continue to be limited secondary to pain and soft tissue restriction. She is being discharged at this time due to upcoming surgery to replace R breast implant on 08/17/2021 in which she will be unable to perform exercises for approximately 6 weeks until cleared. She will perform HEP until surgery date and then resume when cleared. She verbalizes understanding of current plan and agrees with discharge plan at this time. ?  ?  ?GOALS: ?Goals reviewed with patient? No ?  ?SHORT TERM GOALS: ?  ?STG Name Target Date Goal status  ?1 Pt will be compliant and knowledgeable with initial HEP for improved comfort and carryover  ?Baseline: initial HEP given 07/19/2021 MET  ?2 Pt will self report R shoulder/axilla pain no greater than 6/10 for improved comfort and functional ability ?Baseline: 8/10 at worst 07/19/2021 NOT MET  ?  ?LONG TERM GOALS:  ?  ?LTG Name Target Date Goal status  ?1 Pt will decrease Quick DASH disability score to no greater than 50% as proxy for functional improvement ?Baseline: 80% disability ?08/13/2021: 45.5% disability 08/23/2021 MET  ?2 Pt will self report R shoulder/axilla pain no greater than 2/10 for improved comfort and functional ability ?Baseline: 8/10 at worst 08/23/2021 NOT MET  ?3 Pt will improve R shoulder flex to no less than 140 degrees in order to reach into cabinets and improve functional ability ?Baseline: 08/23/2021 NOT  MET  ?4 Pt will improve R shoulder MMT to no less than 4/5 for improved functional ability with ADLs ?Baseline:  08/23/2021 NOT MET  ?  ?PLAN: ?PT FREQUENCY: 2x/week ?  ?PT DURATION: 8 weeks ?  ?PLANNED INTERVENTIONS: Therapeutic exercises, Therapeutic activity, Neuro Muscular re-education, Balance training, Gait training, Patient/Family education, Joint mobilization, Cryotherapy, Moist heat, Taping, Vasopneumatic device, and Manual therapy ?  ?PLAN FOR NEXT SESSION: assess response to HEP, progress periscapular strength, PROM to R shoulder, progress AAROM ? ?PHYSICAL THERAPY DISCHARGE SUMMARY ? ?Visits from Start of Care: 6 ? ?Current functional level related to goals / functional outcomes: ?See goals and objective ?  ?Remaining deficits: ?Limited R shoulder strength and ROM; decreased functional ability ?  ?Education / Equipment: ?HEP  ? ?Patient agrees to discharge. Patient goals were  some partially met; others did not progress . Patient is being discharged due to  upcoming sugery on 08/17/2021. ? ? ?Ward Chatters, PT ?08/13/21 12:21 PM ? ? ?  ? ?

## 2021-08-17 ENCOUNTER — Encounter (HOSPITAL_BASED_OUTPATIENT_CLINIC_OR_DEPARTMENT_OTHER): Payer: Self-pay | Admitting: Plastic Surgery

## 2021-08-17 ENCOUNTER — Other Ambulatory Visit: Payer: Self-pay

## 2021-08-17 ENCOUNTER — Ambulatory Visit (HOSPITAL_BASED_OUTPATIENT_CLINIC_OR_DEPARTMENT_OTHER): Payer: Medicaid Other | Admitting: Certified Registered"

## 2021-08-17 ENCOUNTER — Ambulatory Visit (HOSPITAL_BASED_OUTPATIENT_CLINIC_OR_DEPARTMENT_OTHER)
Admission: RE | Admit: 2021-08-17 | Discharge: 2021-08-17 | Disposition: A | Discharge status: Home / Self Care | Payer: Medicaid Other | Attending: Plastic Surgery | Admitting: Plastic Surgery

## 2021-08-17 ENCOUNTER — Encounter (HOSPITAL_BASED_OUTPATIENT_CLINIC_OR_DEPARTMENT_OTHER)
Admission: RE | Disposition: A | Discharge status: Home / Self Care | Payer: Self-pay | Source: Home / Self Care | Attending: Plastic Surgery

## 2021-08-17 DIAGNOSIS — Z853 Personal history of malignant neoplasm of breast: Secondary | ICD-10-CM

## 2021-08-17 DIAGNOSIS — Z45811 Encounter for adjustment or removal of right breast implant: Secondary | ICD-10-CM | POA: Insufficient documentation

## 2021-08-17 DIAGNOSIS — Z9011 Acquired absence of right breast and nipple: Secondary | ICD-10-CM

## 2021-08-17 DIAGNOSIS — Z9221 Personal history of antineoplastic chemotherapy: Secondary | ICD-10-CM | POA: Insufficient documentation

## 2021-08-17 DIAGNOSIS — Z421 Encounter for breast reconstruction following mastectomy: Secondary | ICD-10-CM | POA: Diagnosis not present

## 2021-08-17 DIAGNOSIS — N651 Disproportion of reconstructed breast: Secondary | ICD-10-CM

## 2021-08-17 DIAGNOSIS — C50411 Malignant neoplasm of upper-outer quadrant of right female breast: Secondary | ICD-10-CM | POA: Diagnosis not present

## 2021-08-17 HISTORY — PX: REMOVAL OF BILATERAL TISSUE EXPANDERS WITH PLACEMENT OF BILATERAL BREAST IMPLANTS: SHX6431

## 2021-08-17 HISTORY — PX: BREAST REDUCTION SURGERY: SHX8

## 2021-08-17 SURGERY — REMOVAL, TISSUE EXPANDER, BREAST, BILATERAL, WITH BILATERAL IMPLANT IMPLANT INSERTION
Anesthesia: General | Site: Breast | Laterality: Right

## 2021-08-17 MED ORDER — FENTANYL CITRATE (PF) 100 MCG/2ML IJ SOLN
INTRAMUSCULAR | Status: AC
  Filled 2021-08-17: qty 2

## 2021-08-17 MED ORDER — PROPOFOL 10 MG/ML IV BOLUS
INTRAVENOUS | Status: AC
  Filled 2021-08-17: qty 20

## 2021-08-17 MED ORDER — MIDAZOLAM HCL 2 MG/2ML IJ SOLN
INTRAMUSCULAR | Status: AC
  Filled 2021-08-17: qty 2

## 2021-08-17 MED ORDER — PHENYLEPHRINE HCL (PRESSORS) 10 MG/ML IV SOLN
INTRAVENOUS | Status: DC | PRN
  Administered 2021-08-17: 120 ug via INTRAVENOUS
  Administered 2021-08-17: 80 ug via INTRAVENOUS

## 2021-08-17 MED ORDER — ESMOLOL HCL 100 MG/10ML IV SOLN
INTRAVENOUS | Status: DC | PRN
  Administered 2021-08-17: 10 mg via INTRAVENOUS

## 2021-08-17 MED ORDER — BUPIVACAINE-EPINEPHRINE 0.25% -1:200000 IJ SOLN
INTRAMUSCULAR | Status: DC | PRN
  Administered 2021-08-17: 10 mL

## 2021-08-17 MED ORDER — FENTANYL CITRATE (PF) 100 MCG/2ML IJ SOLN
INTRAMUSCULAR | Status: DC | PRN
  Administered 2021-08-17: 100 ug via INTRAVENOUS
  Administered 2021-08-17: 50 ug via INTRAVENOUS

## 2021-08-17 MED ORDER — DEXAMETHASONE SODIUM PHOSPHATE 10 MG/ML IJ SOLN
INTRAMUSCULAR | Status: AC
  Filled 2021-08-17: qty 1

## 2021-08-17 MED ORDER — ONDANSETRON HCL 4 MG/2ML IJ SOLN
INTRAMUSCULAR | Status: DC | PRN
  Administered 2021-08-17: 4 mg via INTRAVENOUS

## 2021-08-17 MED ORDER — HYDROMORPHONE HCL 1 MG/ML IJ SOLN
INTRAMUSCULAR | Status: AC
  Filled 2021-08-17: qty 0.5

## 2021-08-17 MED ORDER — ESMOLOL HCL 100 MG/10ML IV SOLN
INTRAVENOUS | Status: AC
  Filled 2021-08-17: qty 10

## 2021-08-17 MED ORDER — SUGAMMADEX SODIUM 500 MG/5ML IV SOLN
INTRAVENOUS | Status: DC | PRN
  Administered 2021-08-17: 400 mg via INTRAVENOUS

## 2021-08-17 MED ORDER — TRANEXAMIC ACID-NACL 1000-0.7 MG/100ML-% IV SOLN
INTRAVENOUS | Status: AC
  Filled 2021-08-17: qty 100

## 2021-08-17 MED ORDER — ONDANSETRON HCL 4 MG/2ML IJ SOLN
INTRAMUSCULAR | Status: AC
  Filled 2021-08-17: qty 2

## 2021-08-17 MED ORDER — PROPOFOL 500 MG/50ML IV EMUL
INTRAVENOUS | Status: DC | PRN
  Administered 2021-08-17: 25 ug/kg/min via INTRAVENOUS

## 2021-08-17 MED ORDER — HYDROMORPHONE HCL 1 MG/ML IJ SOLN
0.2500 mg | INTRAMUSCULAR | Status: DC | PRN
  Administered 2021-08-17 (×2): 0.5 mg via INTRAVENOUS

## 2021-08-17 MED ORDER — ROCURONIUM BROMIDE 10 MG/ML (PF) SYRINGE
PREFILLED_SYRINGE | INTRAVENOUS | Status: AC
  Filled 2021-08-17: qty 10

## 2021-08-17 MED ORDER — SODIUM CHLORIDE 0.9 % IV SOLN: INTRAVENOUS | Status: DC | PRN

## 2021-08-17 MED ORDER — BUPIVACAINE-EPINEPHRINE (PF) 0.25% -1:200000 IJ SOLN
INTRAMUSCULAR | Status: AC
  Filled 2021-08-17: qty 30

## 2021-08-17 MED ORDER — LACTATED RINGERS IV SOLN
INTRAVENOUS | Status: DC | PRN
  Administered 2021-08-17: 1000 mL

## 2021-08-17 MED ORDER — SODIUM CHLORIDE 0.9 % IV SOLN
INTRAVENOUS | Status: AC
  Filled 2021-08-17: qty 10

## 2021-08-17 MED ORDER — PROPOFOL 10 MG/ML IV BOLUS
INTRAVENOUS | Status: DC | PRN
  Administered 2021-08-17: 150 mg via INTRAVENOUS

## 2021-08-17 MED ORDER — EPINEPHRINE PF 1 MG/ML IJ SOLN
INTRAMUSCULAR | Status: AC
Start: 2021-08-17 — End: ?
  Filled 2021-08-17: qty 1

## 2021-08-17 MED ORDER — CHLORHEXIDINE GLUCONATE CLOTH 2 % EX PADS: 6.0000 | MEDICATED_PAD | Freq: Once | CUTANEOUS | Status: DC

## 2021-08-17 MED ORDER — TRANEXAMIC ACID-NACL 1000-0.7 MG/100ML-% IV SOLN
1000.0000 mg | INTRAVENOUS | Status: AC
  Administered 2021-08-17: 1000 mg via INTRAVENOUS

## 2021-08-17 MED ORDER — DEXAMETHASONE SODIUM PHOSPHATE 4 MG/ML IJ SOLN
INTRAMUSCULAR | Status: DC | PRN
  Administered 2021-08-17: 5 mg via INTRAVENOUS

## 2021-08-17 MED ORDER — ROCURONIUM BROMIDE 100 MG/10ML IV SOLN
INTRAVENOUS | Status: DC | PRN
  Administered 2021-08-17: 70 mg via INTRAVENOUS

## 2021-08-17 MED ORDER — ACETAMINOPHEN 500 MG PO TABS
ORAL_TABLET | ORAL | Status: AC
  Filled 2021-08-17: qty 2

## 2021-08-17 MED ORDER — BUPIVACAINE HCL (PF) 0.25 % IJ SOLN
INTRAMUSCULAR | Status: AC
  Filled 2021-08-17: qty 30

## 2021-08-17 MED ORDER — LACTATED RINGERS IV SOLN: INTRAVENOUS | Status: DC

## 2021-08-17 MED ORDER — LIDOCAINE HCL (CARDIAC) PF 100 MG/5ML IV SOSY
PREFILLED_SYRINGE | INTRAVENOUS | Status: DC | PRN
Start: 2021-08-17 — End: 2021-08-17
  Administered 2021-08-17: 60 mg via INTRAVENOUS

## 2021-08-17 MED ORDER — LIDOCAINE 2% (20 MG/ML) 5 ML SYRINGE
INTRAMUSCULAR | Status: AC
  Filled 2021-08-17: qty 5

## 2021-08-17 MED ORDER — ACETAMINOPHEN 500 MG PO TABS
1000.0000 mg | ORAL_TABLET | Freq: Once | ORAL | Status: AC
  Administered 2021-08-17: 1000 mg via ORAL

## 2021-08-17 MED ORDER — CEFAZOLIN SODIUM-DEXTROSE 2-4 GM/100ML-% IV SOLN
2.0000 g | INTRAVENOUS | Status: AC
  Administered 2021-08-17: 2 g via INTRAVENOUS

## 2021-08-17 MED ORDER — SUGAMMADEX SODIUM 500 MG/5ML IV SOLN
INTRAVENOUS | Status: AC
  Filled 2021-08-17: qty 5

## 2021-08-17 MED ORDER — HYDROMORPHONE HCL 1 MG/ML IJ SOLN
INTRAMUSCULAR | Status: DC | PRN
  Administered 2021-08-17: .5 mg via INTRAVENOUS

## 2021-08-17 MED ORDER — CEFAZOLIN SODIUM-DEXTROSE 2-4 GM/100ML-% IV SOLN
INTRAVENOUS | Status: AC
  Filled 2021-08-17: qty 100

## 2021-08-17 MED ORDER — MIDAZOLAM HCL 5 MG/5ML IJ SOLN
INTRAMUSCULAR | Status: DC | PRN
  Administered 2021-08-17: 2 mg via INTRAVENOUS

## 2021-08-17 SURGICAL SUPPLY — 90 items
APL PRP STRL LF DISP 70% ISPRP (MISCELLANEOUS) ×4
APL SKNCLS STERI-STRIP NONHPOA (GAUZE/BANDAGES/DRESSINGS) ×4
BAG DECANTER FOR FLEXI CONT (MISCELLANEOUS) ×4 IMPLANT
BENZOIN TINCTURE PRP APPL 2/3 (GAUZE/BANDAGES/DRESSINGS) ×8 IMPLANT
BLADE SURG 10 STRL SS (BLADE) ×8 IMPLANT
BLADE SURG 15 STRL LF DISP TIS (BLADE) ×2 IMPLANT
BLADE SURG 15 STRL SS (BLADE)
BNDG CMPR MED 10X6 ELC LF (GAUZE/BANDAGES/DRESSINGS) ×2
BNDG ELASTIC 6X10 VLCR STRL LF (GAUZE/BANDAGES/DRESSINGS) ×4 IMPLANT
BRUSH SCRUB EZ PLAIN DRY (MISCELLANEOUS) ×2 IMPLANT
CANISTER SUCT 1200ML W/VALVE (MISCELLANEOUS) ×4 IMPLANT
CHLORAPREP W/TINT 26 (MISCELLANEOUS) ×8 IMPLANT
COVER BACK TABLE 60X90IN (DRAPES) ×4 IMPLANT
COVER MAYO STAND STRL (DRAPES) ×4 IMPLANT
DRAIN CHANNEL 15F RND FF W/TCR (WOUND CARE) IMPLANT
DRAIN PENROSE .5X12 LATEX STL (DRAIN) ×2 IMPLANT
DRAPE LAPAROSCOPIC ABDOMINAL (DRAPES) ×4 IMPLANT
DRAPE UTILITY XL STRL (DRAPES) ×2 IMPLANT
DRSG PAD ABDOMINAL 8X10 ST (GAUZE/BANDAGES/DRESSINGS) ×16 IMPLANT
ELECT BLADE 4.0 EZ CLEAN MEGAD (MISCELLANEOUS) ×4
ELECT COATED BLADE 2.86 ST (ELECTRODE) IMPLANT
ELECT REM PT RETURN 9FT ADLT (ELECTROSURGICAL) ×4
ELECTRODE BLDE 4.0 EZ CLN MEGD (MISCELLANEOUS) IMPLANT
ELECTRODE REM PT RTRN 9FT ADLT (ELECTROSURGICAL) ×2 IMPLANT
EVACUATOR SILICONE 100CC (DRAIN) IMPLANT
FUNNEL KELLER 2 DISP (MISCELLANEOUS) ×4 IMPLANT
GAUZE SPONGE 4X4 12PLY STRL (GAUZE/BANDAGES/DRESSINGS) ×6 IMPLANT
GAUZE XEROFORM 5X9 LF (GAUZE/BANDAGES/DRESSINGS) ×2 IMPLANT
GLOVE SRG 8 PF TXTR STRL LF DI (GLOVE) ×2 IMPLANT
GLOVE SURG ENC MOIS LTX SZ7.5 (GLOVE) ×6 IMPLANT
GLOVE SURG ENC TEXT LTX SZ7.5 (GLOVE) ×4 IMPLANT
GLOVE SURG POLYISO LF SZ6.5 (GLOVE) ×4 IMPLANT
GLOVE SURG POLYISO LF SZ7 (GLOVE) ×2 IMPLANT
GLOVE SURG UNDER POLY LF SZ6.5 (GLOVE) ×4 IMPLANT
GLOVE SURG UNDER POLY LF SZ7 (GLOVE) ×4 IMPLANT
GLOVE SURG UNDER POLY LF SZ8 (GLOVE) ×4
GOWN STRL REUS W/ TWL LRG LVL3 (GOWN DISPOSABLE) ×4 IMPLANT
GOWN STRL REUS W/ TWL XL LVL3 (GOWN DISPOSABLE) ×4 IMPLANT
GOWN STRL REUS W/TWL LRG LVL3 (GOWN DISPOSABLE) ×16
GOWN STRL REUS W/TWL XL LVL3 (GOWN DISPOSABLE) ×8
IMPL BREAST P6.5XHI RND 700 (Breast) IMPLANT
IMPL BRST P6.5XHI RND 700CC (Breast) ×2 IMPLANT
IMPLANT BREAST GEL 700CC (Breast) ×4 IMPLANT
IV NS 500ML (IV SOLUTION)
IV NS 500ML BAXH (IV SOLUTION) IMPLANT
KIT FILL SYSTEM UNIVERSAL (SET/KITS/TRAYS/PACK) IMPLANT
MARKER SKIN DUAL TIP RULER LAB (MISCELLANEOUS) IMPLANT
NDL FILTER BLUNT 18X1 1/2 (NEEDLE) ×2 IMPLANT
NDL HYPO 25X1 1.5 SAFETY (NEEDLE) ×2 IMPLANT
NDL SAFETY ECLIPSE 18X1.5 (NEEDLE) ×2 IMPLANT
NDL SPNL 18GX3.5 QUINCKE PK (NEEDLE) ×2 IMPLANT
NEEDLE FILTER BLUNT 18X 1/2SAF (NEEDLE) ×2
NEEDLE FILTER BLUNT 18X1 1/2 (NEEDLE) ×2 IMPLANT
NEEDLE HYPO 18GX1.5 SHARP (NEEDLE) ×4
NEEDLE HYPO 25X1 1.5 SAFETY (NEEDLE) ×4 IMPLANT
NEEDLE SPNL 18GX3.5 QUINCKE PK (NEEDLE) ×4 IMPLANT
NS IRRIG 1000ML POUR BTL (IV SOLUTION) ×2 IMPLANT
PACK BASIN DAY SURGERY FS (CUSTOM PROCEDURE TRAY) ×4 IMPLANT
PENCIL SMOKE EVACUATOR (MISCELLANEOUS) ×4 IMPLANT
PIN SAFETY STERILE (MISCELLANEOUS) IMPLANT
SIZER BREAST REUSE 700CC (SIZER) ×4
SIZER BREAST REUSE 755CC (SIZER) ×4
SIZER BRST REUSE P6.5XHI 700CC (SIZER) IMPLANT
SIZER BRST REUSE P6.7XHI 755CC (SIZER) IMPLANT
SLEEVE SCD COMPRESS KNEE MED (STOCKING) ×4 IMPLANT
SPIKE FLUID TRANSFER (MISCELLANEOUS) ×2 IMPLANT
SPONGE T-LAP 18X18 ~~LOC~~+RFID (SPONGE) ×12 IMPLANT
STAPLER INSORB 30 2030 C-SECTI (MISCELLANEOUS) ×4 IMPLANT
STAPLER VISISTAT 35W (STAPLE) ×4 IMPLANT
STRIP SUTURE WOUND CLOSURE 1/2 (MISCELLANEOUS) ×14 IMPLANT
SUT ETHILON 2 0 FS 18 (SUTURE) IMPLANT
SUT ETHILON 3 0 PS 1 (SUTURE) ×4 IMPLANT
SUT MNCRL AB 4-0 PS2 18 (SUTURE) ×12 IMPLANT
SUT PDS 3-0 CT2 (SUTURE) ×8
SUT PDS II 3-0 CT2 27 ABS (SUTURE) ×6 IMPLANT
SUT VIC AB 3-0 PS1 18 (SUTURE) ×4
SUT VIC AB 3-0 PS1 18XBRD (SUTURE) IMPLANT
SUT VLOC 180 0 24IN GS25 (SUTURE) IMPLANT
SUT VLOC 180 P-14 24 (SUTURE) ×8 IMPLANT
SUT VLOC 90 P-14 23 (SUTURE) ×2 IMPLANT
SYR 50ML LL SCALE MARK (SYRINGE) ×2 IMPLANT
SYR BULB IRRIG 60ML STRL (SYRINGE) ×4 IMPLANT
SYR CONTROL 10ML LL (SYRINGE) ×4 IMPLANT
TAPE MEASURE VINYL STERILE (MISCELLANEOUS) IMPLANT
TOWEL GREEN STERILE FF (TOWEL DISPOSABLE) ×8 IMPLANT
TUBE CONNECTING 20'X1/4 (TUBING) ×1
TUBE CONNECTING 20X1/4 (TUBING) ×3 IMPLANT
TUBING INFILTRATION IT-10001 (TUBING) ×4 IMPLANT
UNDERPAD 30X36 HEAVY ABSORB (UNDERPADS AND DIAPERS) ×8 IMPLANT
YANKAUER SUCT BULB TIP NO VENT (SUCTIONS) ×4 IMPLANT

## 2021-08-17 NOTE — Transfer of Care (Signed)
Immediate Anesthesia Transfer of Care Note ? ?Patient: Alice [name] ? ?Procedure(s) Performed: REMOVAL OF RIGHT TISSUE EXPANDER WITH PLACEMENT OF RIGHT BREAST IMPLANT (Right: Breast) ?LEFT MAMMARY REDUCTION  (BREAST) WITH FREE NIPPLE GRAFT (Left: Breast) ? ?Patient Location: PACU ? ?Anesthesia Type:General ? ?Level of Consciousness: awake and alert  ? ?Airway & Oxygen Therapy: Patient Spontanous Breathing and Patient connected to face mask oxygen ? ?Post-op Assessment: Report given to RN and Post -op Vital signs reviewed and stable ? ?Post vital signs: Reviewed and stable ? ?Last Vitals:  ?Vitals Value Taken Time  ?BP 144/79 08/17/21 1217  ?Temp    ?Pulse 97 08/17/21 1219  ?Resp 0 08/17/21 1219  ?SpO2 97 % 08/17/21 1219  ?Vitals shown include unvalidated device data. ? ?Last Pain:  ?Vitals:  ? 08/17/21 0856  ?TempSrc: Oral  ?PainSc: 0-No pain  ?   ? ?Patients Stated Pain Goal: 4 (08/17/21 0856) ? ?Complications: No notable events documented. ?

## 2021-08-17 NOTE — Op Note (Signed)
Operative Note  ? ?DATE OF OPERATION: 08/17/2021 ? ?SURGICAL DEPARTMENT: Plastic Surgery ? ?PREOPERATIVE DIAGNOSES: Breast reconstruction for cancer ? ?POSTOPERATIVE DIAGNOSES:  same ? ?PROCEDURE: 1.  Exchange of right breast tissue expander for gel implant ?2.  Lateral and inferior right breast capsulotomy ?3.  Left breast reduction with free nipple graft ? ?SURGEON: Talmadge Coventry, MD ? ?ASSISTANT: Krista Blue, PA ?The advanced practice practitioner (APP) assisted throughout the case.  The APP was essential in retraction and counter traction when needed to make the case progress smoothly.  This retraction and assistance made it possible to see the tissue planes for the procedure.  The assistance was needed for hemostasis, tissue re-approximation and closure of the incision site.  ? ?ANESTHESIA:  General.  ? ?COMPLICATIONS: None.  ? ?INDICATIONS FOR PROCEDURE:  ?The patient, Alice [name] is a 46 y.o. female born on 08-21-1975, is here for treatment of breast reconstruction ?MRN: [id number] ? ?CONSENT:  ?Informed consent was obtained directly from the patient. Risks, benefits and alternatives were fully discussed. Specific risks including but not limited to bleeding, infection, hematoma, seroma, scarring, pain, contracture, asymmetry, wound healing problems, and need for further surgery were all discussed. The patient did have an ample opportunity to have questions answered to satisfaction.  ? ?DESCRIPTION OF PROCEDURE:  ?The patient was taken to the operating room. SCDs were placed and antibiotics were given.  General anesthesia was administered.  The patient's operative site was prepped and draped in a sterile fashion. A time out was performed and all information was confirmed to be correct.  Started by injecting Marcaine with epinephrine along the inferior aspect of her latissimus flap skin paddle on the right side along with laterally where the planned capsulotomy was can be performed.  This was  given time to work.  Inferior incision was made with a 10 blade dissected down to the expander with cautery.  Fluid was evacuated from the expander and the expander was removed without issue.  Sizers were then placed indicating a need for lateral and inferior capsulotomy to try to expand that area which had been radiated.  Sizer was removed and an inferior lateral capsulotomy was performed with cautery to expand that area.  700 cc sizer was then placed which look to be appropriate for her.  Skin was stapled closed I turned my attention to the left side.  I marked out the nipple with a 45 mm cookie cutter.  She had been marked previously in the standing position for a Wise pattern skin excision.  Tumescent solution was infiltrated throughout.  Breast tourniquet was then applied and the nipple was removed to later be used as a graft.  I then de-epithelialized superior with the planned areolar position would be and remove the skin from the periareolar and vertical limbs of the Wise pattern.  Breast tourniquet was then let down and the remainder the incisions were made with a 10 blade.  Excision was done with a combination of 10 blade and cautery.  Modifications were then made until the best symmetry that I could get was obtained.  This included excision excising additional tissue laterally.  At this point she seemed reasonably symmetric given the circumstances radiation implant on the right side.  We have the tissue removed was 2016 g.  Penrose was placed laterally on the left side and secured with a 4-0 Monocryl suture.  The skin had been temporarily stapled closed.  Closure was then done on the left side with interrupted buried  3-0 PDS along the vertical limb, 4-0 Monocryl was used to close the dermis to give a stable platform for the graft in the periareolar area.  Inframammary crease was closed with buried in sorb staples.  The nipple graft was then applied and secured with a 4-0 Monocryl suture.  3 oh V-Loc was  then run throughout the inferior and vertical limbs.  Bolster was then fashioned with a scrub brush sponge, Xeroform and 3-0 nylon for the nipple.  I then went back to the right side.  The sizer was removed.  Pocket was irrigated with triple antibiotic solution.  I changed my gloves.  The implant was then brought onto the field.  I elected to use a Mentor memory gel extra smooth high-profile implant with 700 cc of volume.  Serial number [id number].  This was placed with a Keller funnel.  Closure on the right side was done with 3-0 PDS sutures for the muscle/capsular layer followed by buried 3-0 PDS dermal sutures and a running 3 OV lock.  This gave a nice on table result. ? ?The patient tolerated the procedure well.  There were no complications. The patient was allowed to wake from anesthesia, extubated and taken to the recovery room in satisfactory condition.  ? ?

## 2021-08-17 NOTE — Interval H&P Note (Signed)
History and Physical Interval Note: ? ?08/17/2021 ?9:43 AM ? 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  The various methods of treatment have been discussed with the patient and family. After consideration of risks, benefits and other options for treatment, the patient has consented to  Procedure(s) with comments: ?REMOVAL OF RIGHT TISSUE EXPANDER WITH PLACEMENT OF RIGHT BREAST IMPLANT (Right) - 2 hours ?LEFT MAMMARY REDUCTION  (BREAST) WITH FREE NIPPLE GRAFT (Left) as a surgical intervention.  The patient's history has been reviewed, patient examined, no change in status, stable for surgery.  I have reviewed the patient's chart and labs.  Questions were answered to the patient's satisfaction.   ? ? ?Cindra Presume ? ? ?

## 2021-08-17 NOTE — Discharge Instructions (Addendum)
Activity: Avoid strenuous activity.  No heavy lifting. ? ?Diet: No restrictions.  Try to optimize nutrition with plenty of fruits and vegetables to improve healing. ? ?Wound Care: Please leave ACE wrap on for the next 3 days.  After that, the yellow bolster on left nipple will have to remain in place.  Please keep this bolster dry.  Recommend sponge bathing until you are seen for first post-operative appointment.  After ACE wrap comes off recommend sports bra for gentle compression. Replace the gauze and ABD pads as needed.  A drain was placed near left breast.  Clear-bloody drainage is to be expected.  Replace gauze to help soak up the drainage, as needed.  ? ?Cuidado de heridas: Deje la venda ACE puesta durante los pr?ximos 3 d?as. Despu?s de eso, el refuerzo amarillo en el pez?n izquierdo deber? Primary school teacher. Por favor, mantenga este coj?n seco. Recomiende el ba?o de esponja hasta que lo vean para la primera cita posoperatoria. Despu?s de que se quite la envoltura ACE, recomiende un sost?n deportivo para una compresi?n suave. Reemplace las almohadillas de gasa y ABD seg?n sea necesario. Se coloc? un drenaje cerca del seno izquierdo. Es de esperar un drenaje sanguinolento claro. Reemplace la gasa para ayudar a absorber el drenaje, seg?n sea necesario. ? ?Follow-Up: Scheduled for next week. ? ?Things to watch for:  Call the office if you experience fever, chills, persistent nausea, or significant bleeding.  Mild wound drainage is common after breast reduction surgery and should not be cause for alarm.  ? ?Cosas a tener en cuenta: Llame a la oficina si experimenta fiebre, escalofr?os, n?useas persistentes o sangrado significativo. El drenaje leve de la herida es com?n despu?s de la cirug?a de reducci?n [place] y no debe ser motivo de alarma. ? ?You may have Tylenol again after 3:15pm today, if needed. ? ? ?Post Anesthesia Home Care Instructions ? ?Activity: ?Get plenty of rest for the remainder of the day.  A responsible individual must stay with you for 24 hours following the procedure.  ?For the next 24 hours, DO NOT: ?-Drive a car ?-Paediatric nurse ?-Drink alcoholic beverages ?-Take any medication unless instructed by your physician ?-Make any legal decisions or sign important papers. ? ?Meals: ?Start with liquid foods such as gelatin or soup. Progress to regular foods as tolerated. Avoid greasy, spicy, heavy foods. If nausea and/or vomiting occur, drink only clear liquids until the nausea and/or vomiting subsides. Call your physician if vomiting continues. ? ?Special Instructions/Symptoms: ?Your throat may feel dry or sore from the anesthesia or the breathing tube placed in your throat during surgery. If this causes discomfort, gargle with warm salt water. The discomfort should disappear within 24 hours. ? ?If you had a scopolamine patch placed behind your ear for the management of post- operative nausea and/or vomiting: ? ?1. The medication in the patch is effective for 72 hours, after which it should be removed.  Wrap patch in a tissue and discard in the trash. Wash hands thoroughly with soap and water. ?2. You may remove the patch earlier than 72 hours if you experience unpleasant side effects which may include dry mouth, dizziness or visual disturbances. ?3. Avoid touching the patch. Wash your hands with soap and water after contact with the patch. ?    ?

## 2021-08-17 NOTE — Anesthesia Preprocedure Evaluation (Addendum)
Anesthesia Evaluation  ?Patient identified by MRN, date of birth, ID band ?Patient awake ? ? ? ?Reviewed: ?Allergy & Precautions, H&P , NPO status , Patient's Chart, lab work & pertinent test results ? ?Airway ?Mallampati: II ? ?TM Distance: >3 FB ?Neck ROM: Full ? ? ? Dental ?no notable dental hx. ?(+) Teeth Intact, Dental Advisory Given ?  ?Pulmonary ?neg pulmonary ROS,  ?  ?Pulmonary exam normal ?breath sounds clear to auscultation ? ? ? ? ? ? Cardiovascular ?hypertension, Pt. on medications ? ?Rhythm:Regular Rate:Normal ? ? ?  ?Neuro/Psych ?negative neurological ROS ? negative psych ROS  ? GI/Hepatic ?negative GI ROS, Neg liver ROS,   ?Endo/Other  ?negative endocrine ROS ? Renal/GU ?negative Renal ROS  ?negative genitourinary ?  ?Musculoskeletal ? ? Abdominal ?  ?Peds ? Hematology ? ?(+) Blood dyscrasia, anemia ,   ?Anesthesia Other Findings ? ? Reproductive/Obstetrics ?negative OB ROS ? ?  ? ? ? ? ? ? ? ? ? ? ? ? ? ?  ?  ? ? ? ? ? ? ? ?Anesthesia Physical ?Anesthesia Plan ? ?ASA: 2 ? ?Anesthesia Plan: General  ? ?Post-op Pain Management: Tylenol PO (pre-op)*  ? ?Induction: Intravenous ? ?PONV Risk Score and Plan: 4 or greater and Ondansetron, Dexamethasone and Midazolam ? ?Airway Management Planned: Oral ETT ? ?Additional Equipment:  ? ?Intra-op Plan:  ? ?Post-operative Plan: Extubation in OR ? ?Informed Consent: I have reviewed the patients History and Physical, chart, labs and discussed the procedure including the risks, benefits and alternatives for the proposed anesthesia with the patient or authorized representative who has indicated his/her understanding and acceptance.  ? ? ? ?Dental advisory given ? ?Plan Discussed with: CRNA ? ?Anesthesia Plan Comments:   ? ? ? ? ? ? ?Anesthesia Quick Evaluation ? ?

## 2021-08-17 NOTE — Anesthesia Postprocedure Evaluation (Signed)
Anesthesia Post Note ? ?Patient: Alice [name] ? ?Procedure(s) Performed: REMOVAL OF RIGHT TISSUE EXPANDER WITH PLACEMENT OF RIGHT BREAST IMPLANT (Right: Breast) ?LEFT MAMMARY REDUCTION  (BREAST) WITH FREE NIPPLE GRAFT (Left: Breast) ? ?  ? ?Patient location during evaluation: PACU ?Anesthesia Type: General ?Level of consciousness: awake and alert ?Pain management: pain level controlled ?Vital Signs Assessment: post-procedure vital signs reviewed and stable ?Respiratory status: spontaneous breathing, nonlabored ventilation and respiratory function stable ?Cardiovascular status: blood pressure returned to baseline and stable ?Postop Assessment: no apparent nausea or vomiting ?Anesthetic complications: no ? ? ?No notable events documented. ? ?Last Vitals:  ?Vitals:  ? 08/17/21 1245 08/17/21 1300  ?BP: 137/83 121/75  ?Pulse: 97 85  ?Resp: 17 12  ?Temp:    ?SpO2: 92% 94%  ?  ?Last Pain:  ?Vitals:  ? 08/17/21 1310  ?TempSrc:   ?PainSc: 0-No pain  ? ? ?  ?  ?  ?  ?  ?  ? ?Basim Bartnik,W. EDMOND ? ? ? ? ?

## 2021-08-17 NOTE — Anesthesia Procedure Notes (Signed)
Procedure Name: Intubation ?Date/Time: 08/17/2021 11:43 AM ?Performed by: Verita Lamb, CRNA ?Pre-anesthesia Checklist: Patient identified, Emergency Drugs available, Suction available and Patient being monitored ?Patient Re-evaluated:Patient Re-evaluated prior to induction ?Oxygen Delivery Method: Circle system utilized ?Preoxygenation: Pre-oxygenation with 100% oxygen ?Induction Type: IV induction ?Ventilation: Mask ventilation without difficulty ?Laryngoscope Size: Mac and 4 ?Grade View: Grade I ?Tube type: Oral ?Tube size: 7.0 mm ?Number of attempts: 1 ?Airway Equipment and Method: Stylet and Oral airway ?Placement Confirmation: ETT inserted through vocal cords under direct vision, positive ETCO2, breath sounds checked- equal and bilateral and CO2 detector ?Secured at: 22 cm ?Tube secured with: Tape ?Dental Injury: Teeth and Oropharynx as per pre-operative assessment  ? ? ? ? ?

## 2021-08-20 ENCOUNTER — Encounter (HOSPITAL_BASED_OUTPATIENT_CLINIC_OR_DEPARTMENT_OTHER): Payer: Self-pay | Admitting: Plastic Surgery

## 2021-08-20 LAB — SURGICAL PATHOLOGY

## 2021-08-23 ENCOUNTER — Other Ambulatory Visit: Payer: Self-pay

## 2021-08-23 ENCOUNTER — Ambulatory Visit (INDEPENDENT_AMBULATORY_CARE_PROVIDER_SITE_OTHER): Payer: Medicaid Other | Admitting: Plastic Surgery

## 2021-08-23 DIAGNOSIS — C50411 Malignant neoplasm of upper-outer quadrant of right female breast: Secondary | ICD-10-CM

## 2021-08-23 DIAGNOSIS — Z170 Estrogen receptor positive status [ER+]: Secondary | ICD-10-CM

## 2021-08-23 NOTE — Progress Notes (Signed)
Patient presents postop from exchange of right breast tissue expander for gel implant and left breast reduction with free nipple graft.  She overall feels good.  On exam she looks to have a good result.  She still has a wider base on the left side but some of that may be swelling and hopefully will go down.  The Penrose drain was removed from the left as well as the bolster which looks to show good adherence of the skin graft.  We discussed dressing of the nipple graft and continuing with supportive garments and avoiding strenuous activity.  We will see her again in 2 weeks.  All of her questions were answered. ?

## 2021-09-04 NOTE — Progress Notes (Signed)
Patient is a 46 year old female with PMH of right-sided breast cancer s/p right-sided implant exchange as well as left-sided oncoplastic reduction with free nipple graft performed 08/17/2021 by Dr. Claudia Desanctis who presents to clinic for postoperative follow-up. ? ?She was last seen here in clinic on 08/23/2021.  At that time, exam was reassuring.  She still appears to have wider based on the left side, but suspected to be related to swelling.  Penrose was removed along with the bolster which looks to show good adherence of the skin graft.  Discussed ongoing dressing changes for the nipple graft in addition to continued supportive garments and activity modifications. ? ?Today, patient is doing well.  No specific complaints.  She states that she had purchased a wedge for her bra to help balance out the asymmetry.  [facility]ed [facility].  Provided her with a prescription and she will call to schedule an appointment for evaluation and fitting.  I think that this would be incredibly beneficial for her. ? ?Stitches remain in place around left NAC that appears viable.  Healing well.  No obvious subcutaneous fluid collections appreciated on either side.  Obvious asymmetry, but considerably improved from how she was preoperatively.  Suspect custom bras with inserts will be ideal option for her.  Steri-Strips remain firmly intact, will not remove here in clinic today.  No obvious wounds or drainage. ? ?Return in 2 to 3 weeks.  She tells me that she plans to go to the [place] in [place].  Discussed avoidance of submerging breasts in water during that period of time.  She can certainly go into the ocean or pool up to her mid abdomen, but would avoid submerging breasts until June 1.  She voices understanding and is agreeable.  Translator was used for entirety of assessment and plan. ? ?Picture(s) obtained of the patient and placed in the chart were with the patient's or guardian's permission. ? ?

## 2021-09-06 ENCOUNTER — Other Ambulatory Visit: Payer: Self-pay

## 2021-09-06 ENCOUNTER — Telehealth: Payer: Self-pay | Admitting: *Deleted

## 2021-09-06 ENCOUNTER — Ambulatory Visit (INDEPENDENT_AMBULATORY_CARE_PROVIDER_SITE_OTHER): Payer: Medicaid Other | Admitting: Physician Assistant

## 2021-09-06 DIAGNOSIS — Z170 Estrogen receptor positive status [ER+]: Secondary | ICD-10-CM

## 2021-09-06 DIAGNOSIS — C50411 Malignant neoplasm of upper-outer quadrant of right female breast: Secondary | ICD-10-CM

## 2021-09-06 NOTE — Telephone Encounter (Signed)
Faxed order, demographics,insurance information,and recent office notes to [facility] for Mastectomy supplies for the patient. ? ?Confirmation received and copy of order scanned into the chart.//AB/CMA ?

## 2021-09-08 DIAGNOSIS — Z419 Encounter for procedure for purposes other than remedying health state, unspecified: Secondary | ICD-10-CM | POA: Diagnosis not present

## 2021-09-20 ENCOUNTER — Ambulatory Visit (INDEPENDENT_AMBULATORY_CARE_PROVIDER_SITE_OTHER): Payer: Medicaid Other | Admitting: Physician Assistant

## 2021-09-20 DIAGNOSIS — C50411 Malignant neoplasm of upper-outer quadrant of right female breast: Secondary | ICD-10-CM

## 2021-09-20 DIAGNOSIS — Z170 Estrogen receptor positive status [ER+]: Secondary | ICD-10-CM

## 2021-09-20 NOTE — Progress Notes (Signed)
Patient is a 46 year old female with PMH of right-sided breast cancer s/p right-sided implant exchange as well as left-sided oncoplastic reduction with free nipple graft performed 08/17/2021 by Dr. Claudia Desanctis who presents to clinic for postoperative follow-up. ? ?She was last seen here in clinic on 09/06/2021.  At that time, she still had considerable asymmetry between her oncoplastic reduction breast and reconstruction breast, however still a tremendous improvement from how she was preoperatively.  Her exam was reassuring, no obvious subcutaneous fluid collections or evidence concerning for infection.  Plan was for her to return an additional 2 to 3 weeks.  Continued compressive garments and activity modifications in the interim.  I also strongly encouraged her to seek consult at [facility] [place] [facility] to help with her asymmetry.   ? ?Today, patient is without any complaints.  However, on exam, the left NAC appears to be desiccated with possible superficial skin necrosis.  She has not been applying any Vaseline or other hydrating lotions to the skin/incisions.  Steri-Strips were removed without complication or difficulty.  Small, less than 0.5 cm wound at right breast incision.  Cannot visualize the implant.  Does not appear to be full-thickness.  No surrounding erythema.  As for the left breast, small, less than 0.5 cm wound over vertical limb.  No full-thickness penetration.  NAC appears to have eschar/scabbing with superficial skin necrosis.  Heavily coated with Vaseline today at bedside.  Some scabbing flaked off.  Scattered sutures removed. ? ?Emphasized the importance of moisture control.  She will apply Vaseline twice daily and cover the NAC with gauze to help keep it clean, moist, and covered.  She has not yet followed up with [facility] but reports that she still has her prescription and plans to see them soon. ? ?Given how desiccated she was on exam along with the possible superficial skin  necrosis left NAC, advised that she return in 1 week for follow-up to ensure that it is improving.  She plans to go to the [place] from 8/88/9169 to 11/01/2021.  She understand that she will need to avoid submerging her breasts/incisions in water throughout that period and to avoid any excessive UV radiation to the surgical sites.  Translator used for entirety of assessment and plan.  Picture(s) obtained of the patient and placed in the chart were with the patient's or guardian's permission. ? ? ?

## 2021-09-27 ENCOUNTER — Ambulatory Visit (INDEPENDENT_AMBULATORY_CARE_PROVIDER_SITE_OTHER): Payer: Medicaid Other | Admitting: Physician Assistant

## 2021-09-27 DIAGNOSIS — Z9889 Other specified postprocedural states: Secondary | ICD-10-CM

## 2021-09-27 NOTE — Progress Notes (Signed)
Patient is a 46 year old female with PMH of right-sided breast cancer s/p right-sided implant exchange as well as left-sided oncoplastic reduction with free nipple graft performed 08/17/2021 by Dr. Claudia Desanctis who presents to clinic for postoperative follow-up. ? ?Patient was last seen here in office on 09/20/2021.  At that time, left NAC appeared to be desiccated with possible superficial skin necrosis.  There was also a small, less than 0.5 cm wound at right breast incision.  Implant could not be visualized, it was not full-thickness.  Discussed the importance of Vaseline twice daily in effort to keep her incisions and NAC moist for good wound healing.  Asked that she return in 1 week for close follow-up before she leaves for the [place] for a full month. ? ?Today, patient is doing well.  No complaints.  She has been applying Vaseline twice daily to the wounds as we discussed.  She feels as though it is improving. ? ?Physical exam is entirely reassuring.  The areas of desiccation and superficial wounds have all healed/are healing nicely.  NAC is looking much more viable on left side.  The small wound over right breast implant exchange has completely healed over.  No surrounding areas of erythema or obvious subcutaneous fluid collections noted on exam. ? ?At this point, recommending Vaseline for an additional couple of weeks, but anticipate that everything will heal up nicely in the interim.  Discussed any ongoing restrictions such as avoiding submerging of breasts in water as well as mitigating UV exposure to the incisions.  She can apply scar creams after everything is epithelialized.  Follow-up with office in 6 months. ? ? ?

## 2021-10-04 ENCOUNTER — Telehealth: Payer: Self-pay

## 2021-10-04 NOTE — Telephone Encounter (Signed)
Faxed order, snapshot, demographics, insurance card and last ov note to [facility]. Received confirmation fax.  ?

## 2021-10-08 DIAGNOSIS — Z419 Encounter for procedure for purposes other than remedying health state, unspecified: Secondary | ICD-10-CM | POA: Diagnosis not present

## 2021-10-25 ENCOUNTER — Inpatient Hospital Stay: Admission: RE | Admit: 2021-10-25 | Payer: Medicaid Other | Source: Ambulatory Visit

## 2021-11-02 ENCOUNTER — Other Ambulatory Visit: Payer: Self-pay | Admitting: *Deleted

## 2021-11-02 DIAGNOSIS — Z170 Estrogen receptor positive status [ER+]: Secondary | ICD-10-CM

## 2021-11-06 ENCOUNTER — Inpatient Hospital Stay: Payer: Medicaid Other | Admitting: Hematology and Oncology

## 2021-11-06 ENCOUNTER — Inpatient Hospital Stay: Payer: Medicaid Other | Attending: Family Medicine

## 2021-11-08 DIAGNOSIS — Z419 Encounter for procedure for purposes other than remedying health state, unspecified: Secondary | ICD-10-CM | POA: Diagnosis not present

## 2021-11-22 DIAGNOSIS — Z6837 Body mass index (BMI) 37.0-37.9, adult: Secondary | ICD-10-CM | POA: Diagnosis not present

## 2021-11-22 DIAGNOSIS — I10 Essential (primary) hypertension: Secondary | ICD-10-CM | POA: Diagnosis not present

## 2021-11-22 DIAGNOSIS — H539 Unspecified visual disturbance: Secondary | ICD-10-CM | POA: Diagnosis not present

## 2021-11-22 DIAGNOSIS — E6609 Other obesity due to excess calories: Secondary | ICD-10-CM | POA: Diagnosis not present

## 2021-12-08 DIAGNOSIS — Z419 Encounter for procedure for purposes other than remedying health state, unspecified: Secondary | ICD-10-CM | POA: Diagnosis not present

## 2021-12-24 DIAGNOSIS — M545 Low back pain, unspecified: Secondary | ICD-10-CM | POA: Diagnosis not present

## 2021-12-24 DIAGNOSIS — Z0000 Encounter for general adult medical examination without abnormal findings: Secondary | ICD-10-CM | POA: Diagnosis not present

## 2021-12-24 DIAGNOSIS — Z1159 Encounter for screening for other viral diseases: Secondary | ICD-10-CM | POA: Diagnosis not present

## 2021-12-24 DIAGNOSIS — Z1211 Encounter for screening for malignant neoplasm of colon: Secondary | ICD-10-CM | POA: Diagnosis not present

## 2021-12-24 DIAGNOSIS — Z114 Encounter for screening for human immunodeficiency virus [HIV]: Secondary | ICD-10-CM | POA: Diagnosis not present

## 2022-01-08 DIAGNOSIS — Z419 Encounter for procedure for purposes other than remedying health state, unspecified: Secondary | ICD-10-CM | POA: Diagnosis not present

## 2022-02-04 ENCOUNTER — Other Ambulatory Visit: Payer: Self-pay | Admitting: Family Medicine

## 2022-02-04 DIAGNOSIS — Z1231 Encounter for screening mammogram for malignant neoplasm of breast: Secondary | ICD-10-CM

## 2022-02-08 DIAGNOSIS — Z419 Encounter for procedure for purposes other than remedying health state, unspecified: Secondary | ICD-10-CM | POA: Diagnosis not present

## 2022-03-01 ENCOUNTER — Inpatient Hospital Stay: Admission: RE | Admit: 2022-03-01 | Payer: Medicaid Other | Source: Ambulatory Visit

## 2022-03-10 DIAGNOSIS — Z419 Encounter for procedure for purposes other than remedying health state, unspecified: Secondary | ICD-10-CM | POA: Diagnosis not present

## 2022-03-28 ENCOUNTER — Ambulatory Visit: Payer: Medicaid Other | Admitting: Surgical

## 2022-03-29 ENCOUNTER — Ambulatory Visit: Payer: Medicaid Other | Admitting: Surgical

## 2022-03-29 NOTE — Progress Notes (Deleted)
   Referring Provider Drue Flirt, MD [phone] [address],  [place] [address]   CC: No chief complaint on file.     Alice [name] is an 46 y.o. female.  HPI: Patient is a 46 year old female here for follow-up on her right breast reconstruction.  She underwent exchange of right breast tissue expander and placement of right breast implant with Dr. Claudia Desanctis on 08/17/2021.  She is 7 months postop.  She also underwent left oncoplastic breast reduction with free nipple graft at this time.  Of note, she has a latissimus flap on the right side  She had a Mentor memory gel extra smooth high profile 700 cc implant placed.  Review of Systems General: ***  Physical Exam    08/17/2021    1:33 PM 08/17/2021    1:00 PM 08/17/2021   12:45 PM  Vitals with BMI  Systolic 194 174 081  Diastolic 81 75 83  Pulse 98 85 97    General:  No acute distress,  Alert and oriented, Non-Toxic, Normal speech and affect ***   Assessment/Plan ***  Alice [name] 03/29/2022, 8:16 AM

## 2022-04-05 ENCOUNTER — Inpatient Hospital Stay: Admission: RE | Admit: 2022-04-05 | Payer: Medicaid Other | Source: Ambulatory Visit

## 2022-04-10 DIAGNOSIS — Z419 Encounter for procedure for purposes other than remedying health state, unspecified: Secondary | ICD-10-CM | POA: Diagnosis not present

## 2022-05-10 DIAGNOSIS — Z419 Encounter for procedure for purposes other than remedying health state, unspecified: Secondary | ICD-10-CM | POA: Diagnosis not present

## 2022-05-15 ENCOUNTER — Telehealth: Payer: Self-pay | Admitting: Hematology and Oncology

## 2022-05-15 NOTE — Telephone Encounter (Signed)
Contacted patient to scheduled appointments. Patient is aware of appointments that are scheduled.   

## 2022-05-28 ENCOUNTER — Inpatient Hospital Stay (HOSPITAL_BASED_OUTPATIENT_CLINIC_OR_DEPARTMENT_OTHER): Payer: Medicaid Other | Admitting: Hematology and Oncology

## 2022-05-28 ENCOUNTER — Encounter: Payer: Self-pay | Admitting: Oncology

## 2022-05-28 ENCOUNTER — Inpatient Hospital Stay: Payer: Medicaid Other | Attending: Hematology and Oncology

## 2022-05-28 ENCOUNTER — Encounter: Payer: Self-pay | Admitting: Hematology and Oncology

## 2022-05-28 ENCOUNTER — Ambulatory Visit: Payer: Medicaid Other | Admitting: Hematology and Oncology

## 2022-05-28 ENCOUNTER — Other Ambulatory Visit: Payer: Medicaid Other

## 2022-05-28 VITALS — BP 117/75 | HR 73 | Temp 97.7°F | Resp 16 | Wt 201.1 lb

## 2022-05-28 DIAGNOSIS — Z79899 Other long term (current) drug therapy: Secondary | ICD-10-CM | POA: Diagnosis not present

## 2022-05-28 DIAGNOSIS — Z9221 Personal history of antineoplastic chemotherapy: Secondary | ICD-10-CM | POA: Diagnosis not present

## 2022-05-28 DIAGNOSIS — Z853 Personal history of malignant neoplasm of breast: Secondary | ICD-10-CM | POA: Diagnosis not present

## 2022-05-28 DIAGNOSIS — Z170 Estrogen receptor positive status [ER+]: Secondary | ICD-10-CM

## 2022-05-28 DIAGNOSIS — I10 Essential (primary) hypertension: Secondary | ICD-10-CM | POA: Insufficient documentation

## 2022-05-28 DIAGNOSIS — Z9011 Acquired absence of right breast and nipple: Secondary | ICD-10-CM | POA: Insufficient documentation

## 2022-05-28 DIAGNOSIS — C50411 Malignant neoplasm of upper-outer quadrant of right female breast: Secondary | ICD-10-CM | POA: Diagnosis not present

## 2022-05-28 DIAGNOSIS — Z923 Personal history of irradiation: Secondary | ICD-10-CM | POA: Diagnosis not present

## 2022-05-28 LAB — CBC WITH DIFFERENTIAL (CANCER CENTER ONLY)
Abs Immature Granulocytes: 0 10*3/uL (ref 0.00–0.07)
Basophils Absolute: 0 10*3/uL (ref 0.0–0.1)
Basophils Relative: 0 %
Eosinophils Absolute: 0 10*3/uL (ref 0.0–0.5)
Eosinophils Relative: 1 %
HCT: 40.3 % (ref 36.0–46.0)
Hemoglobin: 12.9 g/dL (ref 12.0–15.0)
Immature Granulocytes: 0 %
Lymphocytes Relative: 32 %
Lymphs Abs: 1.2 10*3/uL (ref 0.7–4.0)
MCH: 28.5 pg (ref 26.0–34.0)
MCHC: 32 g/dL (ref 30.0–36.0)
MCV: 89 fL (ref 80.0–100.0)
Monocytes Absolute: 0.2 10*3/uL (ref 0.1–1.0)
Monocytes Relative: 6 %
Neutro Abs: 2.2 10*3/uL (ref 1.7–7.7)
Neutrophils Relative %: 61 %
Platelet Count: 261 10*3/uL (ref 150–400)
RBC: 4.53 MIL/uL (ref 3.87–5.11)
RDW: 13.2 % (ref 11.5–15.5)
WBC Count: 3.7 10*3/uL — ABNORMAL LOW (ref 4.0–10.5)
nRBC: 0 % (ref 0.0–0.2)

## 2022-05-28 LAB — CMP (CANCER CENTER ONLY)
ALT: 62 U/L — ABNORMAL HIGH (ref 0–44)
AST: 54 U/L — ABNORMAL HIGH (ref 15–41)
Albumin: 4.4 g/dL (ref 3.5–5.0)
Alkaline Phosphatase: 111 U/L (ref 38–126)
Anion gap: 10 (ref 5–15)
BUN: 9 mg/dL (ref 6–20)
CO2: 25 mmol/L (ref 22–32)
Calcium: 9 mg/dL (ref 8.9–10.3)
Chloride: 101 mmol/L (ref 98–111)
Creatinine: 0.8 mg/dL (ref 0.44–1.00)
GFR, Estimated: >60 mL/min (ref >60)
Glucose, Bld: 114 mg/dL — ABNORMAL HIGH (ref 70–99)
Potassium: 3.5 mmol/L (ref 3.5–5.1)
Sodium: 136 mmol/L (ref 135–145)
Total Bilirubin: 0.5 mg/dL (ref 0.3–1.2)
Total Protein: 8.5 g/dL — ABNORMAL HIGH (ref 6.5–8.1)

## 2022-05-28 NOTE — Progress Notes (Signed)
[facility]  Telephone:([phone]) [phone] Fax:([phone]) [phone]    ID: Alice [name] DOB: 1976/01/18  MR#: [id number]  UKG#:[id number]  Patient Care Team: Drue Flirt, MD as PCP - General (Family Medicine) Mauro Kaufmann, RN as Oncology Nurse Navigator Rockwell Germany, RN as Oncology Nurse Navigator Alphonsa Overall, MD as Consulting Physician (General Surgery) Magrinat, Virgie Dad, MD (Inactive) as Consulting Physician (Oncology) Kyung Rudd, MD as Consulting Physician (Radiation Oncology) Larey Dresser, MD as Consulting Physician (Cardiology) Drue Flirt, MD (Family Medicine) Benay Pike, MD OTHER MD:  CHIEF COMPLAINT: estrogen receptor negative, 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  She is now on observation. Since her last visit here, she is felt quite well.  She has some occasional pain right below the right mastectomy and reconstruction site.  No palpable lumps.  She has felt a bump in her right neck.  She recently she had a cold and sore throat which she has recovered from.  No other change in breathing, bowel habits or urinary habits.  Rest of the pertinent 10 point ROS reviewed and negative.  COVID 19 VACCINATION STATUS: Refuses vaccination; has not had COVID   HISTORY OF CURRENT ILLNESS: From the original intake note:  Alice [name] presented with right breast pain with swelling and skin changes (duskiness and dimpling) and left breast pain at 6 o'clock. She underwent bilateral diagnostic mammography with tomography and bilateral breast ultrasonography at [facility] on 09/23/2019 showing: breast density category C; 5.2 cm irregular mass in right breast spanning 10-12 o'clock, with marked peau d'orange changes concerning for inflammatory component; 3.3 cm enlarged lymph node in right  axilla; indeterminate 1 cm oval mass in left breast at 5 o'clock.  Accordingly on 09/30/2019 she proceeded to biopsy of the right breast area in question. The pathology from this procedure ([id number]) showed: invasive mammary carcinoma, grade 3, e-cadherin positive. Prognostic indicators significant for: estrogen receptor, 0% negative and progesterone receptor, 0% negative. Proliferation marker Ki67 at 80%. HER2 positive by immunohistochemistry (3+).  The biopsied right axillary lymph node was positive for metastatic carcinoma.  She also underwent cyst aspiration of the 1 cm mass in the left breast the same day.  The patient's subsequent history is as detailed below.   PAST MEDICAL HISTORY: Past Medical History:  Diagnosis Date   Anemia 11/29/2020   Breast cancer ([place]) 2021   Right   History of chemotherapy    FINISHED CHEMO MAY 2022   Hypertension 11/29/2020    PAST SURGICAL HISTORY: Past Surgical History:  Procedure Laterality Date   BREAST BIOPSY     BREAST RECONSTRUCTION WITH PLACEMENT OF TISSUE EXPANDER AND FLEX HD (ACELLULAR HYDRATED DERMIS) Right 05/29/2021   Procedure: Right breast reconstruction with latissimus flap and tissue expander placement.  Application of acellular dermal matrix.;  Surgeon: Cindra Presume, MD;  Location: [facility];  Service: Plastics;  Laterality: Right;   BREAST REDUCTION SURGERY Left 08/17/2021   Procedure: LEFT MAMMARY REDUCTION  (BREAST) WITH FREE NIPPLE GRAFT;  Surgeon: Cindra Presume, MD;  Location: [place];  Service: Plastics;  Laterality: Left;   CHOLECYSTECTOMY     more than 10 yrs ago per pt on 11-29-2020, laparoscopic   IR IMAGING GUIDED PORT INSERTION  10/13/2019   MASTECTOMY MODIFIED RADICAL Right 03/06/2020   Procedure: MASTECTOMY MODIFIED RADICAL;  Surgeon: Alphonsa Overall, MD;  Location: [facility];  Service: General;  Laterality: Right;  RNFA   PORT-A-CATH REMOVAL Left 12/04/2020   Procedure: REMOVAL PORT-A-CATH;  Surgeon:  Coralie Keens, MD;  Location: [facility];  Service: General;  Laterality: Left;   REMOVAL OF BILATERAL TISSUE EXPANDERS WITH PLACEMENT OF BILATERAL BREAST IMPLANTS Right 08/17/2021   Procedure: REMOVAL OF RIGHT TISSUE EXPANDER WITH PLACEMENT OF RIGHT BREAST IMPLANT;  Surgeon: Cindra Presume, MD;  Location: [facility];  Service: Plastics;  Laterality: Right;    FAMILY HISTORY: No family history on file.  As of 09/2019-- her father is 3 and her mother is 28. She has two brothers and one sister. There is no cancer in her family to her knowledge.    GYNECOLOGIC HISTORY:  Patient's last menstrual period was 09/09/2019. Menarche: 46 years old Age at first live birth: 46 years old GX P 2 LMP regular periods lasting 3 days; periods stopped after the first chemotherapy dose May 2021 Contraceptive: previously used, has not used for the last 2 years HRT n/a  Hysterectomy? no BSO? no   SOCIAL HISTORY: (updated March 2022))  [name] is originally from [place]  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 Husband Alice [name] works as a Training and development officer.  He has some health issues as well, including diabetes.  She lives at home with Alice [name] and their two children. Son Alice [name], age 26, has special needs.  Son Alice [name], age 46, is in high school.    ADVANCED DIRECTIVES: In the absence of any documentation to the contrary, the patient's spouse is their HCPOA.    HEALTH MAINTENANCE: Social History   Tobacco Use   Smoking status: Never   Smokeless tobacco: Never  Vaping Use   Vaping Use: Never used  Substance Use Topics   Alcohol use: Never   Drug use: Never     Colonoscopy: n/a (age)  PAP: 2020  Bone density: n/a (age)   No Known Allergies  Current Outpatient Medications  Medication Sig Dispense Refill   lisinopril (ZESTRIL) 10 MG tablet Take 1 tablet (10 mg total) by mouth daily. 90 tablet 4   ondansetron (ZOFRAN-ODT) 4 MG disintegrating  tablet Take 1 tablet (4 mg total) by mouth every 8 (eight) hours as needed for nausea or vomiting. 20 tablet 0   No current facility-administered medications for this visit.    OBJECTIVE: Spanish speaker who appears stated age  There were no vitals filed for this visit.  Wt Readings from Last 3 Encounters:  08/17/21 205 lb 11 oz (93.3 kg)  08/08/21 211 lb 12.8 oz (96.1 kg)  07/03/21 200 lb (90.7 kg)   There is no height or weight on file to calculate BMI.    ECOG FS:1 - Symptomatic but completely ambulatory    Physical Exam Constitutional:      Appearance: Normal appearance.  Chest:     Comments: Bilateral breasts inspected.  She is status post right mastectomy with reconstruction.  No palpable masses in the reconstruction site.  No regional adenopathy.  Left breast normal to inspection and palpation. Musculoskeletal:     Cervical back: Normal range of motion and neck supple. No rigidity.  Lymphadenopathy:     Cervical: Cervical adenopathy (Small cervical lymphadenopathy in the right neck) present.  Neurological:     Mental Status: She is alert.       LAB RESULTS:  CMP     Component Value Date/Time   NA 139 05/15/2021 1142  K 3.8 05/15/2021 1142   CL 104 05/15/2021 1142   CO2 23 05/15/2021 1142   GLUCOSE 109 (H) 05/15/2021 1142   BUN 10 05/15/2021 1142   CREATININE 0.83 05/15/2021 1142   CREATININE 0.79 10/06/2019 0826   CALCIUM 9.4 05/15/2021 1142   PROT 7.4 05/15/2021 1142   ALBUMIN 4.0 05/15/2021 1142   AST 33 05/15/2021 1142   AST 26 10/06/2019 0826   ALT 46 (H) 05/15/2021 1142   ALT 47 (H) 10/06/2019 0826   ALKPHOS 142 (H) 05/15/2021 1142   BILITOT 1.0 05/15/2021 1142   BILITOT 0.7 10/06/2019 0826   GFRNONAA >60 05/15/2021 1142   GFRNONAA >60 10/06/2019 0826   GFRAA >60 03/13/2020 1142   GFRAA >60 10/06/2019 0826    No results found for: "TOTALPROTELP", "ALBUMINELP", "A1GS", "A2GS", "BETS", "BETA2SER", "GAMS", "MSPIKE", "SPEI"  Lab Results   Component Value Date   WBC 3.7 (L) 05/28/2022   NEUTROABS 2.2 05/28/2022   HGB 12.9 05/28/2022   HCT 40.3 05/28/2022   MCV 89.0 05/28/2022   PLT 261 05/28/2022    No results found for: "LABCA2"  No components found for: "QASTMH962"  No results for input(s): "INR" in the last 168 hours.  No results found for: "LABCA2"  No results found for: "IWL798"  No results found for: "CAN125"  No results found for: "CAN153"  No results found for: "CA2729"  No components found for: "HGQUANT"  No results found for: "CEA1", "CEA" / No results found for: "CEA1", "CEA"   No results found for: "AFPTUMOR"  No results found for: "CHROMOGRNA"  No results found for: "KPAFRELGTCHN", "LAMBDASER", "KAPLAMBRATIO" (kappa/lambda light chains)  No results found for: "HGBA", "HGBA2QUANT", "HGBFQUANT", "HGBSQUAN" (Hemoglobinopathy evaluation)   No results found for: "LDH"  No results found for: "IRON", "TIBC", "IRONPCTSAT" (Iron and TIBC)  No results found for: "FERRITIN"  Urinalysis No results found for: "COLORURINE", "APPEARANCEUR", "LABSPEC", "PHURINE", "GLUCOSEU", "HGBUR", "BILIRUBINUR", "KETONESUR", "PROTEINUR", "UROBILINOGEN", "NITRITE", "LEUKOCYTESUR"   STUDIES: No results found.   ELIGIBLE FOR AVAILABLE RESEARCH PROTOCOL: no  ASSESSMENT: 46 y.o. [place] speaker status post right breast upper outer quadrant sites biopsy and right axillary lymph node biopsy 09/30/2019 for a clinical T3 N2, stage IIIA invasive ductal carcinoma, grade 3, estrogen and progesterone receptor negative, HER-2 amplified, with an MIB-1 of 80%.  (a) CT chest abdomen and pelvis and bone scan 10/15/2019 showed no evidence of metastatic disease  (1) genetics testing 10/13/2019 through the Common Hereditary Cancers Panel offered by [facility] found no deleterious mutations in APC, ATM, AXIN2, BARD1, BMPR1A, BRCA1, BRCA2, BRIP1, CDH1, CDKN2A (p14ARF), CDKN2A (p16INK4a), CKD4, CHEK2, CTNNA1, DICER1, EPCAM  (Deletion/duplication testing only), GREM1 (promoter region deletion/duplication testing only), KIT, MEN1, MLH1, MSH2, MSH3, MSH6, MUTYH, NBN, NF1, NHTL1, PALB2, PDGFRA, PMS2, POLD1, POLE, PTEN, RAD50, RAD51C, RAD51D, RNF43, SDHB, SDHC, SDHD, SMAD4, SMARCA4. STK11, TP53, TSC1, TSC2, and VHL.  The following genes were evaluated for sequence changes only: SDHA and HOXB13 c.251G>A variant only.  (2) neoadjuvant chemotherapy consisting of trastuzumab, pertuzumab, docetaxel and carboplatin every 21 days x 6 started 10/19/2019, completed 02/01/2020  (3) trastuzumab and pertuzumab continued to total 1 year (through May 2022).  (a) echo 10/15/2019 shows an ejection fraction in the 65-70% range.  (b) echo 04/25/2020 shows an ejection fraction in the 60-65% range  (c) echo 07/17/2020 shows an ejection fraction in excess of 75%  (4) status post right modified radical mastectomy on 03/06/2020 showing no residual cancer in the breast or lymph nodes (ypT0 ypN0)  (a)  a total of 7 right axillary lymph nodes were removed  (5) adjuvant radiation 04/13/20-05/30/20:   The right chest wall and regional nodes were treated to 50.4 Gy in 28 fractions, followed by a 10 Gy boost in 5 fractions.    PLAN: Sharlisa is now a little more than 2 yrs from definitive surgery No concern for recurrence on exam. Mammogram scheduled for 1/5 Small cervical Ln on right and mild leukopenia could be related to her recent URI She will RTC in one yr or sooner as needed. All her questions were answered to the best my knowledge. Total time spent: 20 min   *Total Encounter Time as defined by the Centers for Medicare and Medicaid Services includes, in addition to the face-to-face time of a patient visit (documented in the note above) non-face-to-face time: obtaining and reviewing outside history, ordering and reviewing medications, tests or procedures, care coordination (communications with other health care professionals or caregivers) and  documentation in the medical record.

## 2022-06-10 DIAGNOSIS — Z419 Encounter for procedure for purposes other than remedying health state, unspecified: Secondary | ICD-10-CM | POA: Diagnosis not present

## 2022-06-14 ENCOUNTER — Ambulatory Visit
Admission: RE | Admit: 2022-06-14 | Discharge: 2022-06-14 | Disposition: A | Discharge status: Home / Self Care | Payer: Medicaid Other | Source: Ambulatory Visit | Attending: Family Medicine | Admitting: Family Medicine

## 2022-06-14 DIAGNOSIS — Z1231 Encounter for screening mammogram for malignant neoplasm of breast: Secondary | ICD-10-CM

## 2022-07-11 DIAGNOSIS — Z419 Encounter for procedure for purposes other than remedying health state, unspecified: Secondary | ICD-10-CM | POA: Diagnosis not present

## 2022-08-09 DIAGNOSIS — Z419 Encounter for procedure for purposes other than remedying health state, unspecified: Secondary | ICD-10-CM | POA: Diagnosis not present

## 2022-08-16 DIAGNOSIS — M545 Low back pain, unspecified: Secondary | ICD-10-CM | POA: Diagnosis not present

## 2022-08-16 DIAGNOSIS — I10 Essential (primary) hypertension: Secondary | ICD-10-CM | POA: Diagnosis not present

## 2022-08-16 DIAGNOSIS — Z23 Encounter for immunization: Secondary | ICD-10-CM | POA: Diagnosis not present

## 2022-09-09 DIAGNOSIS — Z419 Encounter for procedure for purposes other than remedying health state, unspecified: Secondary | ICD-10-CM | POA: Diagnosis not present

## 2022-10-09 DIAGNOSIS — Z419 Encounter for procedure for purposes other than remedying health state, unspecified: Secondary | ICD-10-CM | POA: Diagnosis not present

## 2022-11-09 DIAGNOSIS — Z419 Encounter for procedure for purposes other than remedying health state, unspecified: Secondary | ICD-10-CM | POA: Diagnosis not present

## 2022-12-09 DIAGNOSIS — Z419 Encounter for procedure for purposes other than remedying health state, unspecified: Secondary | ICD-10-CM | POA: Diagnosis not present

## 2023-01-09 DIAGNOSIS — Z419 Encounter for procedure for purposes other than remedying health state, unspecified: Secondary | ICD-10-CM | POA: Diagnosis not present

## 2023-04-17 ENCOUNTER — Telehealth: Payer: Self-pay | Admitting: Hematology and Oncology

## 2023-04-30 ENCOUNTER — Other Ambulatory Visit: Payer: Self-pay | Admitting: *Deleted

## 2023-04-30 DIAGNOSIS — Z170 Estrogen receptor positive status [ER+]: Secondary | ICD-10-CM

## 2023-05-01 ENCOUNTER — Inpatient Hospital Stay: Payer: Medicaid Other | Attending: Hematology and Oncology

## 2023-05-01 ENCOUNTER — Inpatient Hospital Stay: Payer: Medicaid Other | Admitting: Hematology and Oncology

## 2023-05-01 VITALS — BP 148/98 | HR 80 | Temp 97.7°F | Resp 16 | Wt 207.1 lb

## 2023-05-01 DIAGNOSIS — Z853 Personal history of malignant neoplasm of breast: Secondary | ICD-10-CM | POA: Diagnosis present

## 2023-05-01 DIAGNOSIS — Z9011 Acquired absence of right breast and nipple: Secondary | ICD-10-CM | POA: Insufficient documentation

## 2023-05-01 DIAGNOSIS — Z923 Personal history of irradiation: Secondary | ICD-10-CM | POA: Insufficient documentation

## 2023-05-01 DIAGNOSIS — C50411 Malignant neoplasm of upper-outer quadrant of right female breast: Secondary | ICD-10-CM | POA: Diagnosis not present

## 2023-05-01 DIAGNOSIS — I10 Essential (primary) hypertension: Secondary | ICD-10-CM | POA: Insufficient documentation

## 2023-05-01 DIAGNOSIS — Z79899 Other long term (current) drug therapy: Secondary | ICD-10-CM | POA: Diagnosis not present

## 2023-05-01 DIAGNOSIS — Z08 Encounter for follow-up examination after completed treatment for malignant neoplasm: Secondary | ICD-10-CM | POA: Insufficient documentation

## 2023-05-01 DIAGNOSIS — E119 Type 2 diabetes mellitus without complications: Secondary | ICD-10-CM | POA: Diagnosis not present

## 2023-05-01 DIAGNOSIS — Z9221 Personal history of antineoplastic chemotherapy: Secondary | ICD-10-CM | POA: Diagnosis not present

## 2023-05-01 DIAGNOSIS — Z170 Estrogen receptor positive status [ER+]: Secondary | ICD-10-CM

## 2023-05-01 LAB — CBC WITH DIFFERENTIAL (CANCER CENTER ONLY)
Abs Immature Granulocytes: 0.01 10*3/uL (ref 0.00–0.07)
Basophils Absolute: 0 10*3/uL (ref 0.0–0.1)
Basophils Relative: 1 %
Eosinophils Absolute: 0.1 10*3/uL (ref 0.0–0.5)
Eosinophils Relative: 2 %
HCT: 43.9 % (ref 36.0–46.0)
Hemoglobin: 14.1 g/dL (ref 12.0–15.0)
Immature Granulocytes: 0 %
Lymphocytes Relative: 31 %
Lymphs Abs: 1.7 10*3/uL (ref 0.7–4.0)
MCH: 29 pg (ref 26.0–34.0)
MCHC: 32.1 g/dL (ref 30.0–36.0)
MCV: 90.1 fL (ref 80.0–100.0)
Monocytes Absolute: 0.4 10*3/uL (ref 0.1–1.0)
Monocytes Relative: 7 %
Neutro Abs: 3.2 10*3/uL (ref 1.7–7.7)
Neutrophils Relative %: 59 %
Platelet Count: 301 10*3/uL (ref 150–400)
RBC: 4.87 MIL/uL (ref 3.87–5.11)
RDW: 12.4 % (ref 11.5–15.5)
WBC Count: 5.4 10*3/uL (ref 4.0–10.5)
nRBC: 0 % (ref 0.0–0.2)

## 2023-05-01 LAB — CMP (CANCER CENTER ONLY)
ALT: 42 U/L (ref 0–44)
AST: 30 U/L (ref 15–41)
Albumin: 4.6 g/dL (ref 3.5–5.0)
Alkaline Phosphatase: 102 U/L (ref 38–126)
Anion gap: 8 (ref 5–15)
BUN: 8 mg/dL (ref 6–20)
CO2: 29 mmol/L (ref 22–32)
Calcium: 10.1 mg/dL (ref 8.9–10.3)
Chloride: 102 mmol/L (ref 98–111)
Creatinine: 0.78 mg/dL (ref 0.44–1.00)
GFR, Estimated: >60 mL/min (ref >60)
Glucose, Bld: 108 mg/dL — ABNORMAL HIGH (ref 70–99)
Potassium: 4.2 mmol/L (ref 3.5–5.1)
Sodium: 139 mmol/L (ref 135–145)
Total Bilirubin: 1.2 mg/dL — ABNORMAL HIGH (ref <1.2)
Total Protein: 8 g/dL (ref 6.5–8.1)

## 2023-05-01 NOTE — Progress Notes (Signed)
[facility]  [phone] [phone]    ID: Alice [name] DOB: [birth year]  MR#: [id number]  JYN#:[id number]  Patient Care Team: Verlon Au, MD as PCP - General (Family Medicine) Pershing Proud, RN as Oncology Nurse Navigator Donnelly Angelica, RN as Oncology Nurse Navigator Ovidio Kin, MD as Consulting Physician (General Surgery) Magrinat, Valentino Hue, MD (Inactive) as Consulting Physician (Oncology) Dorothy Puffer, MD as Consulting Physician (Radiation Oncology) Laurey Morale, MD as Consulting Physician (Cardiology) Verlon Au, MD (Family Medicine) Rachel Moulds, MD OTHER MD:  CHIEF COMPLAINT: estrogen receptor negative, 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  She is now on observation.  Certified Spanish interpreter present during the conversation Since her last visit here, she is felt quite well.   No palpable lumps.  No other change in breathing, bowel habits or urinary habits.  No falls, headaches, double vision or other neurological complaints.  She is due for her mammogram in January 2025.  Rest of the pertinent 10 point ROS reviewed and negative.  COVID 19 VACCINATION STATUS: Refuses vaccination; has not had COVID   HISTORY OF CURRENT ILLNESS: From the original intake note:  Alice [name] presented with right breast pain with swelling and skin changes (duskiness and dimpling) and left breast pain at 6 o'clock. She underwent bilateral diagnostic mammography with tomography and bilateral breast ultrasonography at [facility] on 09/23/2019 showing: breast density category C; 5.2 cm irregular mass in right breast spanning 10-12 o'clock, with marked peau d'orange changes concerning for inflammatory component; 3.3 cm enlarged lymph node in right axilla; indeterminate 1 cm  oval mass in left breast at 5 o'clock.  Accordingly on 09/30/2019 she proceeded to biopsy of the right breast area in question. The pathology from this procedure ([id number]) showed: invasive mammary carcinoma, grade 3, e-cadherin positive. Prognostic indicators significant for: estrogen receptor, 0% negative and progesterone receptor, 0% negative. Proliferation marker Ki67 at 80%. HER2 positive by immunohistochemistry (3+).  The biopsied right axillary lymph node was positive for metastatic carcinoma.  She also underwent cyst aspiration of the 1 cm mass in the left breast the same day.  The patient's subsequent history is as detailed below.   PAST MEDICAL HISTORY: Past Medical History:  Diagnosis Date   Anemia 11/29/2020   Breast cancer (HCC) 2021   Right   History of chemotherapy    FINISHED CHEMO MAY 2022   Hypertension 11/29/2020    PAST SURGICAL HISTORY: Past Surgical History:  Procedure Laterality Date   BREAST BIOPSY     BREAST RECONSTRUCTION WITH PLACEMENT OF TISSUE EXPANDER AND FLEX HD (ACELLULAR HYDRATED DERMIS) Right 05/29/2021   Procedure: Right breast reconstruction with latissimus flap and tissue expander placement.  Application of acellular dermal matrix.;  Surgeon: Allena Napoleon, MD;  Location: [facility];  Service: Plastics;  Laterality: Right;   BREAST REDUCTION SURGERY Left 08/17/2021   Procedure: LEFT MAMMARY REDUCTION  (BREAST) WITH FREE NIPPLE GRAFT;  Surgeon: Allena Napoleon, MD;  Location: [facility];  Service: Plastics;  Laterality: Left;   CHOLECYSTECTOMY     more than 10 yrs ago per pt on 11-29-2020, laparoscopic   IR IMAGING GUIDED PORT INSERTION  10/13/2019   MASTECTOMY Right    MASTECTOMY MODIFIED RADICAL Right 03/06/2020   Procedure: MASTECTOMY MODIFIED RADICAL;  Surgeon: Ovidio Kin, MD;  Location: [facility];  Service:  General;  Laterality: Right;  RNFA   PORT-A-CATH REMOVAL Left 12/04/2020   Procedure: REMOVAL PORT-A-CATH;  Surgeon:  Abigail Miyamoto, MD;  Location: [facility];  Service: General;  Laterality: Left;   REDUCTION MAMMAPLASTY Left    REMOVAL OF BILATERAL TISSUE EXPANDERS WITH PLACEMENT OF BILATERAL BREAST IMPLANTS Right 08/17/2021   Procedure: REMOVAL OF RIGHT TISSUE EXPANDER WITH PLACEMENT OF RIGHT BREAST IMPLANT;  Surgeon: Allena Napoleon, MD;  Location: [facility];  Service: Plastics;  Laterality: Right;    FAMILY HISTORY: No family history on file.  As of 09/2019-- her father is 98 and her mother is 30. She has two brothers and one sister. There is no cancer in her family to her knowledge.    GYNECOLOGIC HISTORY:  Patient's last menstrual period was 09/09/2019. Menarche: 46 years old Age at first live birth: 47 years old GX P 2 LMP regular periods lasting 3 days; periods stopped after the first chemotherapy dose May 2021 Contraceptive: previously used, has not used for the last 2 years HRT n/a  Hysterectomy? no BSO? no   SOCIAL HISTORY: (updated March 2022))  Alice [name] is originally from the [name].  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 Husband Alice [name] works as a Financial risk analyst.  He has some health issues as well, including diabetes.  She lives at home with Alice [name] and their two children. Son Alice [name], age 15, has special needs.  Son Alice [name], age 42, is in high school.    ADVANCED DIRECTIVES: In the absence of any documentation to the contrary, the patient's spouse is their HCPOA.    HEALTH MAINTENANCE: Social History   Tobacco Use   Smoking status: Never   Smokeless tobacco: Never  Vaping Use   Vaping status: Never Used  Substance Use Topics   Alcohol use: Never   Drug use: Never     Colonoscopy: n/a (age)  PAP: 2020  Bone density: n/a (age)   No Known Allergies  Current Outpatient Medications  Medication Sig Dispense Refill   lisinopril (ZESTRIL) 10 MG tablet Take 1 tablet (10 mg total) by mouth daily. 90 tablet 4   ondansetron  (ZOFRAN-ODT) 4 MG disintegrating tablet Take 1 tablet (4 mg total) by mouth every 8 (eight) hours as needed for nausea or vomiting. 20 tablet 0   No current facility-administered medications for this visit.    OBJECTIVE: Spanish speaker who appears stated age  Vitals:   05/01/23 1529  BP: (!) 148/98  Pulse: 80  Resp: 16  Temp: 97.7 F (36.5 C)  SpO2: 100%    Wt Readings from Last 3 Encounters:  05/01/23 207 lb 1.6 oz (93.9 kg)  05/28/22 201 lb 1.6 oz (91.2 kg)  08/17/21 205 lb 11 oz (93.3 kg)   Body mass index is 33.43 kg/m.    ECOG FS:1 - Symptomatic but completely ambulatory    Physical Exam Constitutional:      Appearance: Normal appearance.  Chest:     Comments: Bilateral breasts inspected.  She is status post right mastectomy with reconstruction.  No palpable masses in the reconstruction site.  No regional adenopathy.  Left breast normal to inspection and palpation. Musculoskeletal:     Cervical back: Normal range of motion and neck supple. No rigidity.  Lymphadenopathy:     Cervical: Cervical adenopathy (Small cervical lymphadenopathy in the right neck) present.  Neurological:     Mental Status: She is alert.       LAB RESULTS:  CMP     Component Value Date/Time   NA 136 05/28/2022 1056   K 3.5 05/28/2022 1056   CL 101 05/28/2022 1056   CO2 25 05/28/2022 1056   GLUCOSE 114 (H) 05/28/2022 1056   BUN 9 05/28/2022 1056   CREATININE 0.80 05/28/2022 1056   CALCIUM 9.0 05/28/2022 1056   PROT 8.5 (H) 05/28/2022 1056   ALBUMIN 4.4 05/28/2022 1056   AST 54 (H) 05/28/2022 1056   ALT 62 (H) 05/28/2022 1056   ALKPHOS 111 05/28/2022 1056   BILITOT 0.5 05/28/2022 1056   GFRNONAA >60 05/28/2022 1056   GFRAA >60 03/13/2020 1142   GFRAA >60 10/06/2019 0826    No results found for: "TOTALPROTELP", "ALBUMINELP", "A1GS", "A2GS", "BETS", "BETA2SER", "GAMS", "MSPIKE", "SPEI"  Lab Results  Component Value Date   WBC 5.4 05/01/2023   NEUTROABS 3.2 05/01/2023    HGB 14.1 05/01/2023   HCT 43.9 05/01/2023   MCV 90.1 05/01/2023   PLT 301 05/01/2023    No results found for: "LABCA2"  No components found for: "ZOXWRU045"  No results for input(s): "INR" in the last 168 hours.  No results found for: "LABCA2"  No results found for: "WUJ811"  No results found for: "CAN125"  No results found for: "CAN153"  No results found for: "CA2729"  No components found for: "HGQUANT"  No results found for: "CEA1", "CEA" / No results found for: "CEA1", "CEA"   No results found for: "AFPTUMOR"  No results found for: "CHROMOGRNA"  No results found for: "KPAFRELGTCHN", "LAMBDASER", "KAPLAMBRATIO" (kappa/lambda light chains)  No results found for: "HGBA", "HGBA2QUANT", "HGBFQUANT", "HGBSQUAN" (Hemoglobinopathy evaluation)   No results found for: "LDH"  No results found for: "IRON", "TIBC", "IRONPCTSAT" (Iron and TIBC)  No results found for: "FERRITIN"  Urinalysis No results found for: "COLORURINE", "APPEARANCEUR", "LABSPEC", "PHURINE", "GLUCOSEU", "HGBUR", "BILIRUBINUR", "KETONESUR", "PROTEINUR", "UROBILINOGEN", "NITRITE", "LEUKOCYTESUR"   STUDIES: No results found.   ELIGIBLE FOR AVAILABLE RESEARCH PROTOCOL: no  ASSESSMENT: 47 y.o. [place] Spanish speaker status post right breast upper outer quadrant sites biopsy and right axillary lymph node biopsy 09/30/2019 for a clinical T3 N2, stage IIIA invasive ductal carcinoma, grade 3, estrogen and progesterone receptor negative, HER-2 amplified, with an MIB-1 of 80%.  (a) CT chest abdomen and pelvis and bone scan 10/15/2019 showed no evidence of metastatic disease  (1) genetics testing 10/13/2019 through the Common Hereditary Cancers Panel offered by [facility] found no deleterious mutations in APC, ATM, AXIN2, BARD1, BMPR1A, BRCA1, BRCA2, BRIP1, CDH1, CDKN2A (p14ARF), CDKN2A (p16INK4a), CKD4, CHEK2, CTNNA1, DICER1, EPCAM (Deletion/duplication testing only), GREM1 (promoter region deletion/duplication  testing only), KIT, MEN1, MLH1, MSH2, MSH3, MSH6, MUTYH, NBN, NF1, NHTL1, PALB2, PDGFRA, PMS2, POLD1, POLE, PTEN, RAD50, RAD51C, RAD51D, RNF43, SDHB, SDHC, SDHD, SMAD4, SMARCA4. STK11, TP53, TSC1, TSC2, and VHL.  The following genes were evaluated for sequence changes only: SDHA and HOXB13 c.251G>A variant only.  (2) neoadjuvant chemotherapy consisting of trastuzumab, pertuzumab, docetaxel and carboplatin every 21 days x 6 started 10/19/2019, completed 02/01/2020  (3) trastuzumab and pertuzumab continued to total 1 year (through May 2022).  (a) echo 10/15/2019 shows an ejection fraction in the 65-70% range.  (b) echo 04/25/2020 shows an ejection fraction in the 60-65% range  (c) echo 07/17/2020 shows an ejection fraction in excess of 75%  (4) status post right modified radical mastectomy on 03/06/2020 showing no residual cancer in the breast or lymph nodes (ypT0 ypN0)  (a) a total of 7 right axillary lymph nodes were removed  (5) adjuvant radiation 04/13/20-05/30/20:  The right chest wall and regional nodes were treated to 50.4 Gy in 28 fractions, followed by a 10 Gy boost in 5 fractions.    PLAN: Khadisha is now a little more than 3 yrs from definitive surgery No concern for recurrence on exam. Mammogram of the left breast in January 2024 with no concerns.  This will be repeated in January 2025 and has been ordered. Leukopenia noted on previous exam has now resolved.  Her CBC is perfectly normal, very mild elevation in T. bili, we can continue to monitor this, no concerns.   She will RTC in one yr or sooner as needed. All her questions were answered to the best my knowledge. Total time spent: 20 min   *Total Encounter Time as defined by the Centers for Medicare and Medicaid Services includes, in addition to the face-to-face time of a patient visit (documented in the note above) non-face-to-face time: obtaining and reviewing outside history, ordering and reviewing medications, tests or  procedures, care coordination (communications with other health care professionals or caregivers) and documentation in the medical record.

## 2023-05-02 ENCOUNTER — Encounter: Payer: Self-pay | Admitting: Oncology

## 2023-06-16 ENCOUNTER — Ambulatory Visit: Payer: Medicaid Other

## 2023-07-03 ENCOUNTER — Encounter: Payer: Self-pay | Admitting: Hematology and Oncology

## 2023-07-03 ENCOUNTER — Ambulatory Visit
Admission: RE | Admit: 2023-07-03 | Discharge: 2023-07-03 | Disposition: A | Discharge status: Home / Self Care | Payer: Medicaid Other | Source: Ambulatory Visit | Attending: Hematology and Oncology | Admitting: Hematology and Oncology

## 2023-07-03 DIAGNOSIS — C50411 Malignant neoplasm of upper-outer quadrant of right female breast: Secondary | ICD-10-CM

## 2023-08-08 ENCOUNTER — Ambulatory Visit
Admission: RE | Admit: 2023-08-08 | Discharge: 2023-08-08 | Disposition: A | Discharge status: Home / Self Care | Payer: Medicaid Other | Source: Ambulatory Visit | Attending: Hematology and Oncology | Admitting: Hematology and Oncology

## 2023-08-08 DIAGNOSIS — Z170 Estrogen receptor positive status [ER+]: Secondary | ICD-10-CM

## 2023-10-31 ENCOUNTER — Other Ambulatory Visit (INDEPENDENT_AMBULATORY_CARE_PROVIDER_SITE_OTHER): Payer: Self-pay

## 2023-10-31 ENCOUNTER — Encounter: Payer: Self-pay | Admitting: Physician Assistant

## 2023-10-31 ENCOUNTER — Ambulatory Visit: Admitting: Physician Assistant

## 2023-10-31 DIAGNOSIS — M545 Low back pain, unspecified: Secondary | ICD-10-CM

## 2023-10-31 DIAGNOSIS — G8929 Other chronic pain: Secondary | ICD-10-CM

## 2023-10-31 NOTE — Progress Notes (Signed)
 Office Visit Note   Patient: Alice [name]           Date of Birth: 01-18-1976           MRN: [id number] Visit Date: 10/31/2023              Requested by: Luba Running, FNP [address] [place],  [place] [address] PCP: Vergil Glasser, MD   Assessment & Plan: Visit Diagnoses:  1. Chronic bilateral low back pain without sciatica     Plan: Patient patient is a pleasant 48 year old woman seen with the help of an interpreter today.  She has a 3-week history of low mid back pain.  Denies any injuries denies any hematuria.  She says she does get some but she describes some radicular type symptoms that go into her left leg.  Denies any loss of bowel or bladder control or weakness.  She is takes Tylenol  does not seem to help pain keeps her up at night.  Denies any injury.  X-rays do not show any significant pathology I will review them with Dr. 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  Other than monitoring.  She did have a whole nuclear med scan in 2021 which did not show any metastases.  I recommended if she can switch to ibuprofen  that would be preferable she will speak with her oncologist about that.  I will also call her in a small amount of Robaxin .  She does complain of some abdominal pain I have asked the importance that she follow this up with her primary care provider.  Will follow-up in a month we will also start her on some physical therapy  Follow-Up Instructions: No follow-ups on file.   Orders:  Orders Placed This Encounter  Procedures   XR Lumbar Spine 2-3 Views   No orders of the defined types were placed in this encounter.     Procedures: No procedures performed   Clinical Data: No additional findings.   Subjective: No chief complaint on file.   HPI Patient is a pleasant 49 year old woman comes in today with a 3-week history of low back pain.  She said she has  not had any injury she has been taking Tylenol  keeps her up at night.  Standing causes pain.  It does radiate down her leg Review of Systems  All other systems reviewed and are negative.    Objective: Vital Signs: LMP 09/09/2019   Physical Exam Constitutional:      Appearance: Normal appearance.  Skin:    General: Skin is warm and dry.  Neurological:     General: No focal deficit present.     Mental Status: She is alert and oriented to person, place, and time.  Psychiatric:        Mood and Affect: Mood normal.        Behavior: Behavior normal.     Ortho Exam Examination of her lumbar spine she has pain with forward flexion at the mid lower spine but no step-offs.  She has good dorsiflexion plantarflexion extension and flexion of her legs.  She does have some pain in her right leg but more focused focally in her knee not radicular in findings she does have some pain and paresthesias in the top of her thigh.  No evidence of an infective process Specialty Comments:  No specialty comments available.  Imaging: XR Lumbar Spine 2-3  Views Result Date: 10/31/2023 2 radiographs of the lumbar spine demonstrate well maintained alignment.  No evidence of any fracture well-maintained joint spacing.  Cannot appreciate any lytic changes    PMFS History: Patient Active Problem List   Diagnosis Date Noted   Hypertension 08/08/2021   Port-A-Cath in place 11/09/2019   Genetic testing 10/14/2019   Malignant neoplasm of upper-outer quadrant of right breast in female, estrogen receptor positive (HCC) 10/05/2019   Past Medical History:  Diagnosis Date   Anemia 11/29/2020   Breast cancer (HCC) 2021   Right   History of chemotherapy    FINISHED CHEMO MAY 2022   Hypertension 11/29/2020    History reviewed. No pertinent family history.  Past Surgical History:  Procedure Laterality Date   BREAST BIOPSY     BREAST RECONSTRUCTION WITH PLACEMENT OF TISSUE EXPANDER AND FLEX HD (ACELLULAR  HYDRATED DERMIS) Right 05/29/2021   Procedure: Right breast reconstruction with latissimus flap and tissue expander placement.  Application of acellular dermal matrix.;  Surgeon: Barb Bonito, MD;  Location: [facility];  Service: Plastics;  Laterality: Right;   BREAST REDUCTION SURGERY Left 08/17/2021   Procedure: LEFT MAMMARY REDUCTION  (BREAST) WITH FREE NIPPLE GRAFT;  Surgeon: Barb Bonito, MD;  Location: [facility];  Service: Plastics;  Laterality: Left;   CHOLECYSTECTOMY     more than 10 yrs ago per pt on 11-29-2020, laparoscopic   IR IMAGING GUIDED PORT INSERTION  10/13/2019   MASTECTOMY Right    MASTECTOMY MODIFIED RADICAL Right 03/06/2020   Procedure: MASTECTOMY MODIFIED RADICAL;  Surgeon: Juanita Norlander, MD;  Location: [facility];  Service: General;  Laterality: Right;  RNFA   PORT-A-CATH REMOVAL Left 12/04/2020   Procedure: REMOVAL PORT-A-CATH;  Surgeon: Oza Blumenthal, MD;  Location: [facility];  Service: General;  Laterality: Left;   REDUCTION MAMMAPLASTY Left    REMOVAL OF BILATERAL TISSUE EXPANDERS WITH PLACEMENT OF BILATERAL BREAST IMPLANTS Right 08/17/2021   Procedure: REMOVAL OF RIGHT TISSUE EXPANDER WITH PLACEMENT OF RIGHT BREAST IMPLANT;  Surgeon: Barb Bonito, MD;  Location: [facility];  Service: Plastics;  Laterality: Right;   Social History   Occupational History   Not on file  Tobacco Use   Smoking status: Never   Smokeless tobacco: Never  Vaping Use   Vaping status: Never Used  Substance and Sexual Activity   Alcohol use: Never   Drug use: Never   Sexual activity: Not on file

## 2023-11-12 ENCOUNTER — Encounter: Payer: Self-pay | Admitting: Internal Medicine

## 2023-11-24 ENCOUNTER — Encounter: Payer: Self-pay | Admitting: Oncology

## 2023-11-26 ENCOUNTER — Encounter

## 2023-11-27 ENCOUNTER — Other Ambulatory Visit: Payer: Self-pay

## 2023-11-27 ENCOUNTER — Ambulatory Visit: Attending: Physician Assistant

## 2023-11-27 DIAGNOSIS — G8929 Other chronic pain: Secondary | ICD-10-CM | POA: Diagnosis not present

## 2023-11-27 DIAGNOSIS — M545 Low back pain, unspecified: Secondary | ICD-10-CM | POA: Diagnosis not present

## 2023-11-27 DIAGNOSIS — M5459 Other low back pain: Secondary | ICD-10-CM | POA: Diagnosis present

## 2023-11-27 DIAGNOSIS — R262 Difficulty in walking, not elsewhere classified: Secondary | ICD-10-CM | POA: Diagnosis present

## 2023-11-27 NOTE — Therapy (Signed)
 OUTPATIENT PHYSICAL THERAPY THORACOLUMBAR EVALUATION   Patient Name: Alice [name] MRN: [id number] DOB:Feb 04, 1976, 48 y.o., female Today's Date: 11/27/2023  END OF SESSION:  PT End of Session - 11/27/23 1749     Visit Number 1    Date for PT Re-Evaluation 02/19/24    Progress Note Due on Visit 10    PT Start Time 1530    PT Stop Time 1623    PT Time Calculation (min) 53 min    Activity Tolerance Patient limited by pain    Behavior During Therapy [facility] for tasks assessed/performed          Past Medical History:  Diagnosis Date   Anemia 11/29/2020   Breast cancer (HCC) 2021   Right   History of chemotherapy    FINISHED CHEMO MAY 2022   Hypertension 11/29/2020   Past Surgical History:  Procedure Laterality Date   BREAST BIOPSY     BREAST RECONSTRUCTION WITH PLACEMENT OF TISSUE EXPANDER AND FLEX HD (ACELLULAR HYDRATED DERMIS) Right 05/29/2021   Procedure: Right breast reconstruction with latissimus flap and tissue expander placement.  Application of acellular dermal matrix.;  Surgeon: Barb Bonito, MD;  Location: [facility];  Service: Plastics;  Laterality: Right;   BREAST REDUCTION SURGERY Left 08/17/2021   Procedure: LEFT MAMMARY REDUCTION  (BREAST) WITH FREE NIPPLE GRAFT;  Surgeon: Barb Bonito, MD;  Location: [facility];  Service: Plastics;  Laterality: Left;   CHOLECYSTECTOMY     more than 10 yrs ago per pt on 11-29-2020, laparoscopic   IR IMAGING GUIDED PORT INSERTION  10/13/2019   MASTECTOMY Right    MASTECTOMY MODIFIED RADICAL Right 03/06/2020   Procedure: MASTECTOMY MODIFIED RADICAL;  Surgeon: Juanita Norlander, MD;  Location: [facility];  Service: General;  Laterality: Right;  RNFA   PORT-A-CATH REMOVAL Left 12/04/2020   Procedure: REMOVAL PORT-A-CATH;  Surgeon: Oza Blumenthal, MD;  Location: [facility];  Service: General;  Laterality: Left;   REDUCTION MAMMAPLASTY Left    REMOVAL OF BILATERAL TISSUE EXPANDERS WITH PLACEMENT OF  BILATERAL BREAST IMPLANTS Right 08/17/2021   Procedure: REMOVAL OF RIGHT TISSUE EXPANDER WITH PLACEMENT OF RIGHT BREAST IMPLANT;  Surgeon: Barb Bonito, MD;  Location: [facility];  Service: Plastics;  Laterality: Right;   Patient Active Problem List   Diagnosis Date Noted   Hypertension 08/08/2021   Port-A-Cath in place 11/09/2019   Genetic testing 10/14/2019   Malignant neoplasm of upper-outer quadrant of right breast in female, estrogen receptor positive (HCC) 10/05/2019    PCP: Marten Skipper, MD  REFERRING PROVIDER: Persons, Norma Beckers PA, orthopedist  REFERRING DIAG: central/ lower back pain  Rationale for Evaluation and Treatment: Rehabilitation  THERAPY DIAG:  Other low back pain  Difficulty in walking, not elsewhere classified  ONSET DATE: chronic ,increased recently, saw orthopedist  10/31/23  SUBJECTIVE:  SUBJECTIVE STATEMENT: Pt communicating through interpreter. States pain constant mid back, hurts to take a deep breath, also pain abdomen on R just below ribs, which hurts with the mid back pain. Doesn't change regardless of position, constant 10/10 pain. Was supposed to have muscle relaxer but drug store didn't have for her when she went to pick up  PERTINENT HISTORY:  Referred by orthopedic clinic   PAIN:  Are you having pain? Reports 10/10 pain constant, can't sleep, pain with standing, walking  PRECAUTIONS: None  RED FLAGS: None   WEIGHT BEARING RESTRICTIONS: No  FALLS:  Has patient fallen in last 6 months? No  LIVING ENVIRONMENT: Lives with: lives with their family Lives in: House/apartment Stairs: unclear   OCCUPATION: not working. Has 2 sons who live with her has adult autistic child whom she assists with for meals, basic ADL's, doesn't have to  physically lift him  PLOF: Independent  PATIENT GOALS: get rid of pain  NEXT MD VISIT: one month  OBJECTIVE:  Note: Objective measures were completed at Evaluation unless otherwise noted.  DIAGNOSTIC FINDINGS:  Mild ddd LS per orthopedic note  PATIENT SURVEYS:  Modified Oswestry Modified Oswestry Modified Oswestry Low Back Pain Disability Questionnaire: 30 / 50 = 60.0 %    COGNITION: Overall cognitive status: Within functional limits for tasks assessed     SENSATION: Reports numb R ant thigh  MUSCLE LENGTH: Hamstrings: Right wfl deg; Left wfl deg Andy Bannister test: Right na deg; Left na deg  POSTURE: scoliotic positioning in standing with L concavity lumbar region, L iliac crest elevated compared to R skin fold present L flank and not on R  PALPATION: Pt unable to get completely prone due to breast reconstruction and portal, reports tenderness over spinous processes with light PA glides T 5 to L5 Non tender psoas and iliacus on R  LUMBAR ROM:   AROM eval  Flexion 10p!  Extension 0p!  Right lateral flexion nt  Left lateral flexion nt  Right rotation nt  Left rotation nt   (Blank rows = not tested)  LOWER EXTREMITY ROM:     Passive  Right eval Left eval  Hip flexion 95 p! wfl  Hip extension    Hip abduction    Hip adduction    Hip internal rotation    Hip external rotation    Knee flexion    Knee extension    Ankle dorsiflexion    Ankle plantarflexion    Ankle inversion    Ankle eversion     (Blank rows = not tested)  LOWER EXTREMITY MMT:  equal strength noted B LE's, able to heel walk and toe walk  MMT Right eval Left eval  Hip flexion    Hip extension    Hip abduction    Hip adduction    Hip internal rotation    Hip external rotation    Knee flexion    Knee extension    Ankle dorsiflexion    Ankle plantarflexion    Ankle inversion    Ankle eversion     (Blank rows = not tested)  LUMBAR SPECIAL TESTS:  FABER test: Negative  FUNCTIONAL  TESTS:  30 seconds chair stand test    GAIT: Distance walked: in clinic 89' Assistive device utilized: None Level of assistance: Complete Independence Comments: no sig deficits noted  TREATMENT DATE: 11/27/23:  evaluation, attempted several manual techniques as part of evaluation including pelvic traction with sheet, and side lying muscle energy for flex,rot, SB, both without  relief Added supine stretch with 2 pool noodle parallel to thoracic spine  and pt with some relief  Attempted door frame stretch for pecs, thoracic region.                                                                                                                               PATIENT EDUCATION:  Education details: POC, goals Person educated: Patient Education method: Explanation, Demonstration, Tactile cues, and Verbal cues Education comprehension: verbalized understanding, returned demonstration, and verbal cues required  HOME EXERCISE PROGRAM: Instructed to stretch over a roll at home for pain relief. Education regarding mechanical change in lumbar and thoracic spine and expected outcome with stretching  ASSESSMENT:  CLINICAL IMPRESSION: Patient is a 48 y.o. female who was evaluated today for physical therapy for central lower back pain.  Clinical findings reveal an obvious scoliotic change in mid lumbar/ lower thoracic spine with L sided concavity.  She demonstrated nearly non existent lumbar ROM and c/o 10/10 pain with movement.  No weakness noted LE's,  some loss of ROM for R hip flexion.  Responded poorly to palpation and to attempted lumbar rotational stretch.  She does complain of difficulty with deep breath and of corresponding R abdominal pain which she was advised to discuss with her PCP .  Will attempt physical therapy to address her pain and mobility.    OBJECTIVE IMPAIRMENTS: decreased ROM, hypomobility, impaired perceived functional ability, impaired flexibility, postural dysfunction, and pain.    ACTIVITY LIMITATIONS: carrying, lifting, bending, sitting, standing, squatting, sleeping, transfers, bed mobility, and caring for others  PARTICIPATION LIMITATIONS: cleaning, shopping, and community activity  PERSONAL FACTORS: Behavior pattern, Fitness, Past/current experiences, and Time since onset of injury/illness/exacerbation are also affecting patient's functional outcome.H/o breast CA and mastectomy with reconstruction 2023.   REHAB POTENTIAL: Fair    CLINICAL DECISION MAKING: Evolving/moderate complexity  EVALUATION COMPLEXITY: Moderate   GOALS: Goals reviewed with patient? Yes  SHORT TERM GOALS: Target date: 2 weeks,12/11/23  I HEP Baseline: Goal status: INITIAL  LONG TERM GOALS: Target date: 12 weeks 02/19/24  Modified Oswestry improve from 30/50 to 20/50 Baseline:  Goal status: INITIAL  2.  Able to flex and extend Lumbar spine 60% or greater Baseline:  10% Goal status: INITIAL  3.  30 sec sit to stand improve from 6 to 12 or greater Baseline:  Goal status: INITIAL   PLAN:  PT FREQUENCY: 1-2x/week  PT DURATION: 12 weeks  PLANNED INTERVENTIONS: 97110-Therapeutic exercises, 97530- Therapeutic activity, [id number]- Neuromuscular re-education, 97535- Self Care, 40981- Manual therapy, and 97012- Traction (mechanical).  PLAN FOR NEXT SESSION: continue to utilize techniques to improve spinal decompression, alignment, therex progression, modalities as needed   Astrid Vides L Dorrie Cocuzza, PT, DPT, OCS 11/27/2023, 6:26 PM

## 2023-12-02 ENCOUNTER — Encounter

## 2023-12-02 NOTE — Addendum Note (Signed)
 Addended byBETHA CREDIT, Zaccai Chavarin L on: 12/02/2023 12:06 PM   Modules accepted: Orders

## 2023-12-04 ENCOUNTER — Other Ambulatory Visit: Payer: Self-pay

## 2023-12-04 ENCOUNTER — Ambulatory Visit

## 2023-12-04 VITALS — Wt 200.0 lb

## 2023-12-04 DIAGNOSIS — Z1211 Encounter for screening for malignant neoplasm of colon: Secondary | ICD-10-CM

## 2023-12-04 MED ORDER — PEG 3350-KCL-NA BICARB-NACL 420 G PO SOLR: 4000.0000 mL | Freq: Once | ORAL | 0 refills | Status: AC

## 2023-12-04 NOTE — Progress Notes (Signed)
 Denies allergies to eggs or soy products. Denies complication of anesthesia or sedation. Denies use of weight loss medication. Denies use of O2.   Emmi instructions given for colonoscopy   Pre-visit was conducted with Nat interpreting for the patient. Instructions were written in Spanish.  Patient was given an opportunity to ask questions and verbalizes understanding.

## 2023-12-16 ENCOUNTER — Ambulatory Visit: Attending: Physician Assistant

## 2023-12-16 DIAGNOSIS — R262 Difficulty in walking, not elsewhere classified: Secondary | ICD-10-CM | POA: Insufficient documentation

## 2023-12-16 DIAGNOSIS — M5459 Other low back pain: Secondary | ICD-10-CM | POA: Insufficient documentation

## 2023-12-23 ENCOUNTER — Ambulatory Visit: Admitting: Rehabilitation

## 2023-12-24 ENCOUNTER — Ambulatory Visit: Admitting: Internal Medicine

## 2023-12-24 ENCOUNTER — Encounter: Payer: Self-pay | Admitting: Internal Medicine

## 2023-12-24 VITALS — BP 125/90 | HR 81 | Temp 98.1°F | Resp 17 | Ht 62.6 in | Wt 200.0 lb

## 2023-12-24 DIAGNOSIS — D120 Benign neoplasm of cecum: Secondary | ICD-10-CM | POA: Diagnosis not present

## 2023-12-24 DIAGNOSIS — D123 Benign neoplasm of transverse colon: Secondary | ICD-10-CM | POA: Diagnosis not present

## 2023-12-24 DIAGNOSIS — D124 Benign neoplasm of descending colon: Secondary | ICD-10-CM

## 2023-12-24 DIAGNOSIS — D122 Benign neoplasm of ascending colon: Secondary | ICD-10-CM

## 2023-12-24 DIAGNOSIS — Z1211 Encounter for screening for malignant neoplasm of colon: Secondary | ICD-10-CM | POA: Diagnosis not present

## 2023-12-24 DIAGNOSIS — K648 Other hemorrhoids: Secondary | ICD-10-CM

## 2023-12-24 MED ORDER — SODIUM CHLORIDE 0.9 % IV SOLN: 500.0000 mL | Freq: Once | INTRAVENOUS | Status: DC

## 2023-12-24 NOTE — Patient Instructions (Signed)
 USTED TUVO UN PROCEDIMIENTO ENDOSCPICO HOY EN EL [facility]:   Lea el informe del procedimiento que se le entreg para cualquier pregunta especfica sobre lo que se Dentist.  Si el informe del examen no responde a sus preguntas, por favor llame a su gastroenterlogo para aclararlo.  Si usted solicit que no se le den [employer] de lo que se Clinical cytogeneticist en su procedimiento al [employer] va a cuidar, entonces el informe del procedimiento se ha incluido en un sobre sellado para que usted lo revise despus cuando le sea ms conveniente.   LO QUE PUEDE ESPERAR: Algunas sensaciones de hinchazn en el abdomen.  Puede tener ms gases de lo normal.  El caminar puede ayudarle a eliminar el aire que se le puso en el tracto gastrointestinal durante el procedimiento y reducir la hinchazn.  Si le hicieron una endoscopia inferior (como una colonoscopia o una sigmoidoscopia flexible), podra notar manchas de sangre en las heces fecales o en el papel higinico.  Si se someti a una preparacin intestinal para su procedimiento, es posible que no tenga una evacuacin intestinal normal durante [employer].   Tenga en cuenta:  Es posible que note un poco de irritacin y congestin en la nariz o algn drenaje.  Esto es debido al oxgeno [employer] durante su procedimiento.  No hay que preocuparse y esto debe desaparecer ms o Regulatory affairs officer.   SNTOMAS PARA REPORTAR INMEDIATAMENTE:  Despus de una endoscopia inferior (colonoscopia o sigmoidoscopia flexible):  Cantidades excesivas de sangre en las heces fecales  Sensibilidad significativa o empeoramiento de los dolores abdominales   Hinchazn aguda del abdomen que antes no tena   Fiebre de 100F o ms   Para asuntos urgentes o de Associate Professor, puede comunicarse con un gastroenterlogo a cualquier hora llamando al [phone].  DIETA:  Recomendamos una comida pequea al principio, pero luego puede continuar con su dieta normal.  Tome  muchos lquidos, Tax adviser las bebidas alcohlicas durante 24 horas.    ACTIVIDAD:  Debe planear tomarse las cosas con calma por el resto del da y no debe CONDUCIR ni usar maquinaria pesada Patent examiner (debido a los medicamentos de sedacin utilizados durante el examen).     SEGUIMIENTO: Nuestro personal llamar al nmero que aparece en su historial al siguiente da hbil de su procedimiento para ver cmo se siente y para responder cualquier pregunta o inquietud que pueda tener con respecto a la informacin que se le dio despus del procedimiento. Si no podemos contactarle, le dejaremos un mensaje.  Sin embargo, si se siente bien y no tiene English as a second language teacher, no es necesario que nos devuelva la llamada.  Asumiremos que ha regresado a sus actividades diarias normales sin incidentes. Si se le tomaron algunas biopsias, le contactaremos por telfono o por carta en las prximas 3 semanas.  Si no ha sabido [employer] biopsias en el transcurso de 3 semanas, por favor llmenos al [phone].   FIRMAS/CONFIDENCIALIDAD: Usted y/o el acompaante que le cuide han firmado documentos que se ingresarn en su historial mdico electrnico.  Estas firmas atestiguan el hecho de que la informacin anterior

## 2023-12-24 NOTE — Progress Notes (Signed)
 Report given to PACU, vss

## 2023-12-24 NOTE — Progress Notes (Signed)
 GASTROENTEROLOGY PROCEDURE H&P NOTE   Primary Care Physician: Lang Dedra DASEN, MD    Reason for Procedure:   Colon cancer screening  Plan:    Colonoscopy  Patient is appropriate for endoscopic procedure(s) in the ambulatory ([facility]) setting.  The nature of the procedure, as well as the risks, benefits, and alternatives were carefully and thoroughly reviewed with the patient. Ample time for discussion and questions allowed. The patient understood, was satisfied, and agreed to proceed.     HPI: Alice [name] is a 48 y.o. female who presents for colonoscopy for colon cancer screening. Denies blood in stools, changes in bowel habits, or unintentional weight loss. Denies family history of colon cancer. This is her first colonoscopy.  Past Medical History:  Diagnosis Date   Anemia 11/29/2020   Blood transfusion without reported diagnosis    Breast cancer (HCC) 2021   Right   Cataract    Glaucoma    History of chemotherapy    FINISHED CHEMO MAY 2022   Hypertension 11/29/2020    Past Surgical History:  Procedure Laterality Date   BREAST BIOPSY     BREAST RECONSTRUCTION WITH PLACEMENT OF TISSUE EXPANDER AND FLEX HD (ACELLULAR HYDRATED DERMIS) Right 05/29/2021   Procedure: Right breast reconstruction with latissimus flap and tissue expander placement.  Application of acellular dermal matrix.;  Surgeon: Elisabeth Craig RAMAN, MD;  Location: [facility];  Service: Plastics;  Laterality: Right;   BREAST REDUCTION SURGERY Left 08/17/2021   Procedure: LEFT MAMMARY REDUCTION  (BREAST) WITH FREE NIPPLE GRAFT;  Surgeon: Elisabeth Craig RAMAN, MD;  Location: [facility];  Service: Plastics;  Laterality: Left;   CHOLECYSTECTOMY     more than 10 yrs ago per pt on 11-29-2020, laparoscopic   ERCP     IR IMAGING GUIDED PORT INSERTION  10/13/2019   MASTECTOMY Right    MASTECTOMY MODIFIED RADICAL Right 03/06/2020   Procedure: MASTECTOMY MODIFIED RADICAL;  Surgeon: Ethyl Lenis, MD;   Location: [facility];  Service: General;  Laterality: Right;  RNFA   PORT-A-CATH REMOVAL Left 12/04/2020   Procedure: REMOVAL PORT-A-CATH;  Surgeon: Vernetta Berg, MD;  Location: [facility];  Service: General;  Laterality: Left;   REDUCTION MAMMAPLASTY Left    REMOVAL OF BILATERAL TISSUE EXPANDERS WITH PLACEMENT OF BILATERAL BREAST IMPLANTS Right 08/17/2021   Procedure: REMOVAL OF RIGHT TISSUE EXPANDER WITH PLACEMENT OF RIGHT BREAST IMPLANT;  Surgeon: Elisabeth Craig RAMAN, MD;  Location: [facility];  Service: Plastics;  Laterality: Right;    Prior to Admission medications   Medication Sig Start Date End Date Taking? Authorizing Provider  lisinopril  (ZESTRIL ) 10 MG tablet Take 1 tablet (10 mg total) by mouth daily. 11/02/20  Yes Magrinat, Sandria BROCKS, MD  cholecalciferol (VITAMIN D3) 25 MCG (1000 UNIT) tablet Take 1,000 Units by mouth daily.    [provider]  ondansetron  (ZOFRAN -ODT) 4 MG disintegrating tablet Take 1 tablet (4 mg total) by mouth every 8 (eight) hours as needed for nausea or vomiting. Patient not taking: Reported on 12/24/2023 08/08/21   Landy Honora CROME, PA-C  OVER THE COUNTER MEDICATION     [provider]  OVER THE COUNTER MEDICATION     [provider]    Current Outpatient Medications  Medication Sig Dispense Refill   lisinopril  (ZESTRIL ) 10 MG tablet Take 1 tablet (10 mg total) by mouth daily. 90 tablet 4   cholecalciferol (VITAMIN D3) 25 MCG (1000 UNIT) tablet Take 1,000 Units by mouth daily.  ondansetron  (ZOFRAN -ODT) 4 MG disintegrating tablet Take 1 tablet (4 mg total) by mouth every 8 (eight) hours as needed for nausea or vomiting. (Patient not taking: Reported on 12/24/2023) 20 tablet 0   OVER THE COUNTER MEDICATION      OVER THE COUNTER MEDICATION      Current Facility-Administered Medications  Medication Dose Route Frequency Provider Last Rate Last Admin   0.9 %  sodium chloride  infusion  500 mL Intravenous Once  Eyal Greenhaw C, MD        Allergies as of 12/24/2023   (No Known Allergies)    Family History  Problem Relation Age of Onset   Heart disease Maternal Grandfather    Colon cancer Neg Hx    Esophageal cancer Neg Hx    Stomach cancer Neg Hx    Rectal cancer Neg Hx     Social History   Socioeconomic History   Marital status: Married    Spouse name: Not on file   Number of children: Not on file   Years of education: Not on file   Highest education level: Not on file  Occupational History   Not on file  Tobacco Use   Smoking status: Never   Smokeless tobacco: Never  Vaping Use   Vaping status: Never Used  Substance and Sexual Activity   Alcohol use: Never   Drug use: Never   Sexual activity: Not on file  Other Topics Concern   Not on file  Social History Narrative   Not on file   Social Drivers of Health   Financial Resource Strain: Not on File (09/27/2021)   Received from [employer]    Financial Resource Strain: 0  Food Insecurity: At Risk (10/21/2023)   Received from [facility] Insecurity    Within the past 12 months, you worried that your food would run out before you got money to buy more.: 2  Transportation Needs: At Risk (10/21/2023)   Received from [facility] Needs    In the past 12 months, has lack of transportation kept you from medical appointments, meetings, work or from getting things needed for daily living?: 2  Physical Activity: Not on File (09/27/2021)   Received from [facility]   Physical Activity    Physical Activity: 0  Stress: Not on File (09/27/2021)   Received from [facility]   Stress    Stress: 0  Social Connections: Not on File (02/13/2023)   Received from [employer]   Social Connections    Connectedness: 0  Intimate Partner Violence: Not on file    Physical Exam: Vital signs in last 24 hours: BP (!) 137/94   Pulse 80   Temp 98.1 F (36.7 C)   Resp 12   Ht 5' 2.6 (1.59 m)   Wt 200 lb (90.7 kg)   LMP  09/09/2019   SpO2 100%   BMI 35.88 kg/m  GEN: NAD EYE: Sclerae anicteric ENT: MMM CV: Non-tachycardic Pulm: No increased work of breathing GI: Soft, NT/ND NEURO:  Alert & Oriented   Estefana Kidney, MD [facility]  12/24/2023 1:38 PM

## 2023-12-24 NOTE — Progress Notes (Signed)
 Pt's states no medical or surgical changes since previsit or office visit.

## 2023-12-24 NOTE — Op Note (Signed)
 [facility] Patient Name: Alice [name] Procedure Date: 12/24/2023 12:32 PM MRN: [id number] Endoscopist: Rosario Estefana Kidney , , [phone] Age: 48 Referring MD:  Date of Birth: 1975/09/27 Gender: Female Account #: [id number] Procedure:                Colonoscopy Indications:              Screening for colorectal malignant neoplasm, This                            is the patient's first colonoscopy Medicines:                Monitored Anesthesia Care Procedure:                Pre-Anesthesia Assessment:                           - Prior to the procedure, a History and Physical                            was performed, and patient medications and                            allergies were reviewed. The patient's tolerance of                            previous anesthesia was also reviewed. The risks                            and benefits of the procedure and the sedation                            options and risks were discussed with the patient.                            All questions were answered, and informed consent                            was obtained. Prior Anticoagulants: The patient has                            taken no anticoagulant or antiplatelet agents. ASA                            Grade Assessment: II - A patient with mild systemic                            disease. After reviewing the risks and benefits,                            the patient was deemed in satisfactory condition to                            undergo the procedure.  After obtaining informed consent, the colonoscope                            was passed under direct vision. Throughout the                            procedure, the patient's blood pressure, pulse, and                            oxygen saturations were monitored continuously. The                            Olympus Scope SN: [id number] was introduced through                            the anus and  advanced to the the terminal ileum.                            The colonoscopy was performed without difficulty.                            The patient tolerated the procedure well. The                            quality of the bowel preparation was excellent. The                            terminal ileum, ileocecal valve, appendiceal                            orifice, and rectum were photographed. Scope In: 1:40:13 PM Scope Out: 2:03:24 PM Scope Withdrawal Time: 0 hours 18 minutes 34 seconds  Total Procedure Duration: 0 hours 23 minutes 11 seconds  Findings:                 The terminal ileum appeared normal.                           Five sessile polyps were found in the transverse                            colon, ascending colon and cecum. The polyps were 3                            to 5 mm in size. These polyps were removed with a                            cold snare. Resection and retrieval were complete.                           Two sessile polyps were found in the descending                            colon. The polyps  were 3 to 4 mm in size. These                            polyps were removed with a cold snare. Resection                            and retrieval were complete.                           Non-bleeding internal hemorrhoids were found during                            retroflexion. Complications:            No immediate complications. Estimated Blood Loss:     Estimated blood loss was minimal. Impression:               - The examined portion of the ileum was normal.                           - Five 3 to 5 mm polyps in the transverse colon, in                            the ascending colon and in the cecum, removed with                            a cold snare. Resected and retrieved.                           - Two 3 to 4 mm polyps in the descending colon,                            removed with a cold snare. Resected and retrieved.                           -  Non-bleeding internal hemorrhoids. Recommendation:           - Discharge patient to home (with escort).                           - Await pathology results.                           - The findings and recommendations were discussed                            with the patient. Dr Estefana Federico Rosario Estefana Federico,  12/24/2023 2:06:37 PM

## 2023-12-25 ENCOUNTER — Telehealth: Payer: Self-pay

## 2023-12-25 ENCOUNTER — Ambulatory Visit

## 2023-12-25 DIAGNOSIS — M5459 Other low back pain: Secondary | ICD-10-CM | POA: Diagnosis present

## 2023-12-25 DIAGNOSIS — R262 Difficulty in walking, not elsewhere classified: Secondary | ICD-10-CM | POA: Diagnosis present

## 2023-12-25 NOTE — Therapy (Signed)
 OUTPATIENT PHYSICAL THERAPY THORACOLUMBAR TREATMENT   Patient Name: Opie Fanton MRN: [id number] DOB:1975/10/07, 48 y.o., female Today's Date: 12/25/2023  END OF SESSION:  PT End of Session - 12/25/23 1149     Visit Number 2    Date for PT Re-Evaluation 02/19/24    Progress Note Due on Visit 10    PT Start Time 1100    PT Stop Time 1146    PT Time Calculation (min) 46 min    Activity Tolerance Patient limited by pain    Behavior During Therapy [facility] for tasks assessed/performed           Past Medical History:  Diagnosis Date   Anemia 11/29/2020   Blood transfusion without reported diagnosis    Breast cancer (HCC) 2021   Right   Cataract    Glaucoma    History of chemotherapy    FINISHED CHEMO MAY 2022   Hypertension 11/29/2020   Past Surgical History:  Procedure Laterality Date   BREAST BIOPSY     BREAST RECONSTRUCTION WITH PLACEMENT OF TISSUE EXPANDER AND FLEX HD (ACELLULAR HYDRATED DERMIS) Right 05/29/2021   Procedure: Right breast reconstruction with latissimus flap and tissue expander placement.  Application of acellular dermal matrix.;  Surgeon: Elisabeth Craig RAMAN, MD;  Location: [facility];  Service: Plastics;  Laterality: Right;   BREAST REDUCTION SURGERY Left 08/17/2021   Procedure: LEFT MAMMARY REDUCTION  (BREAST) WITH FREE NIPPLE GRAFT;  Surgeon: Elisabeth Craig RAMAN, MD;  Location: [facility];  Service: Plastics;  Laterality: Left;   CHOLECYSTECTOMY     more than 10 yrs ago per pt on 11-29-2020, laparoscopic   ERCP     IR IMAGING GUIDED PORT INSERTION  10/13/2019   MASTECTOMY Right    MASTECTOMY MODIFIED RADICAL Right 03/06/2020   Procedure: MASTECTOMY MODIFIED RADICAL;  Surgeon: Ethyl Lenis, MD;  Location: [facility];  Service: General;  Laterality: Right;  RNFA   PORT-A-CATH REMOVAL Left 12/04/2020   Procedure: REMOVAL PORT-A-CATH;  Surgeon: Vernetta Berg, MD;  Location: [facility];  Service: General;  Laterality: Left;    REDUCTION MAMMAPLASTY Left    REMOVAL OF BILATERAL TISSUE EXPANDERS WITH PLACEMENT OF BILATERAL BREAST IMPLANTS Right 08/17/2021   Procedure: REMOVAL OF RIGHT TISSUE EXPANDER WITH PLACEMENT OF RIGHT BREAST IMPLANT;  Surgeon: Elisabeth Craig RAMAN, MD;  Location:  SURGERY CENTER;  Service: Plastics;  Laterality: Right;   Patient Active Problem List   Diagnosis Date Noted   Hypertension 08/08/2021   Port-A-Cath in place 11/09/2019   Genetic testing 10/14/2019   Malignant neoplasm of upper-outer quadrant of right breast in female, estrogen receptor positive (HCC) 10/05/2019    PCP: Dedra Essex, MD  REFERRING PROVIDER: Persons, Ronal Dragon PA, orthopedist  REFERRING DIAG: central/ lower back pain  Rationale for Evaluation and Treatment: Rehabilitation  THERAPY DIAG:  Other low back pain  Difficulty in walking, not elsewhere classified  ONSET DATE: chronic ,increased recently, saw orthopedist  10/31/23  SUBJECTIVE:  SUBJECTIVE STATEMENT: Pt communicating through interpreter. Pt constant pain, midline and going across her low back  PERTINENT HISTORY:  Referred by orthopedic clinic   PAIN:  Are you having pain? Reports 10/10 pain constant, can't sleep, pain with standing, walking  PRECAUTIONS: None  RED FLAGS: None   WEIGHT BEARING RESTRICTIONS: No  FALLS:  Has patient fallen in last 6 months? No  LIVING ENVIRONMENT: Lives with: lives with their family Lives in: House/apartment Stairs: unclear   OCCUPATION: not working. Has 2 sons who live with her has adult autistic child whom she assists with for meals, basic ADL's, doesn't have to physically lift him  PLOF: Independent  PATIENT GOALS: get rid of pain  NEXT MD VISIT: one month  OBJECTIVE:  Note: Objective measures were  completed at Evaluation unless otherwise noted.  DIAGNOSTIC FINDINGS:  Mild ddd LS per orthopedic note  PATIENT SURVEYS:  Modified Oswestry Modified Oswestry Modified Oswestry Low Back Pain Disability Questionnaire: 30 / 50 = 60.0 %    COGNITION: Overall cognitive status: Within functional limits for tasks assessed     SENSATION: Reports numb R ant thigh  MUSCLE LENGTH: Hamstrings: Right wfl deg; Left wfl deg Debby test: Right na deg; Left na deg  POSTURE: scoliotic positioning in standing with L concavity lumbar region, L iliac crest elevated compared to R skin fold present L flank and not on R  PALPATION: Pt unable to get completely prone due to breast reconstruction and portal, reports tenderness over spinous processes with light PA glides T 5 to L5 Non tender psoas and iliacus on R  LUMBAR ROM:   AROM eval  Flexion 10p!  Extension 0p!  Right lateral flexion nt  Left lateral flexion nt  Right rotation nt  Left rotation nt   (Blank rows = not tested)  LOWER EXTREMITY ROM:     Passive  Right eval Left eval  Hip flexion 95 p! wfl  Hip extension    Hip abduction    Hip adduction    Hip internal rotation    Hip external rotation    Knee flexion    Knee extension    Ankle dorsiflexion    Ankle plantarflexion    Ankle inversion    Ankle eversion     (Blank rows = not tested)  LOWER EXTREMITY MMT:  equal strength noted B LE's, able to heel walk and toe walk  MMT Right eval Left eval  Hip flexion    Hip extension    Hip abduction    Hip adduction    Hip internal rotation    Hip external rotation    Knee flexion    Knee extension    Ankle dorsiflexion    Ankle plantarflexion    Ankle inversion    Ankle eversion     (Blank rows = not tested)  LUMBAR SPECIAL TESTS:  FABER test: Negative  FUNCTIONAL TESTS:  30 seconds chair stand test    GAIT: Distance walked: in clinic 29' Assistive device utilized: None Level of assistance: Complete  Independence Comments: no sig deficits noted  TREATMENT DATE:  12/25/23 Attempted hook lying decompression exercises- but increased pain TENS to B low back musculature x 10 min- education provided on how to obtain, instructions given through interpreter STM to B thoraco-lumbar paraspinals in sitting   11/27/23:  evaluation, attempted several manual techniques as part of evaluation including pelvic traction with sheet, and side lying muscle energy for flex,rot, SB, both without relief Added supine stretch with  2 pool noodle parallel to thoracic spine  and pt with some relief  Attempted door frame stretch for pecs, thoracic region.                                                                                                                               PATIENT EDUCATION:  Education details: POC, goals Person educated: Patient Education method: Explanation, Demonstration, Tactile cues, and Verbal cues Education comprehension: verbalized understanding, returned demonstration, and verbal cues required  HOME EXERCISE PROGRAM: Instructed to stretch over a roll at home for pain relief. Education regarding mechanical change in lumbar and thoracic spine and expected outcome with stretching  ASSESSMENT:  CLINICAL IMPRESSION: Pt very painful today, we tried spinal decompression exercises but pt very apprehensive to try exercises d/t pain levels. We did do TENS for her lower back and patient reported it felt good but no change in pain. Provided information and instruction on the device for patient on how to obtain. Pt also reported STM felt really good but pain remains the same.  EVAL; Patient is a 48 y.o. female who was evaluated today for physical therapy for central lower back pain.  Clinical findings reveal an obvious scoliotic change in mid lumbar/ lower thoracic spine with L sided concavity.  She demonstrated nearly non existent lumbar ROM and c/o 10/10 pain with movement.  No weakness noted  LE's,  some loss of ROM for R hip flexion.  Responded poorly to palpation and to attempted lumbar rotational stretch.  She does complain of difficulty with deep breath and of corresponding R abdominal pain which she was advised to discuss with her PCP .  Will attempt physical therapy to address her pain and mobility.    OBJECTIVE IMPAIRMENTS: decreased ROM, hypomobility, impaired perceived functional ability, impaired flexibility, postural dysfunction, and pain.   ACTIVITY LIMITATIONS: carrying, lifting, bending, sitting, standing, squatting, sleeping, transfers, bed mobility, and caring for others  PARTICIPATION LIMITATIONS: cleaning, shopping, and community activity  PERSONAL FACTORS: Behavior pattern, Fitness, Past/current experiences, and Time since onset of injury/illness/exacerbation are also affecting patient's functional outcome.H/o breast CA and mastectomy with reconstruction 2023.   REHAB POTENTIAL: Fair    CLINICAL DECISION MAKING: Evolving/moderate complexity  EVALUATION COMPLEXITY: Moderate   GOALS: Goals reviewed with patient? Yes  SHORT TERM GOALS: Target date: 2 weeks,12/11/23  I HEP Baseline: Goal status: INITIAL  LONG TERM GOALS: Target date: 12 weeks 02/19/24  Modified Oswestry improve from 30/50 to 20/50 Baseline:  Goal status: INITIAL  2.  Able to flex and extend Lumbar spine 60% or greater Baseline:  10% Goal status: INITIAL  3.  30 sec sit to stand improve from 6 to 12 or greater Baseline:  Goal status: INITIAL   PLAN:  PT FREQUENCY: 1-2x/week  PT DURATION: 12 weeks  PLANNED INTERVENTIONS: 97110-Therapeutic exercises, 97530- Therapeutic activity, [id number]- Neuromuscular re-education, 97535- Self Care, [id number]- Manual therapy, and 97012- Traction (mechanical).  PLAN FOR NEXT SESSION: continue to  utilize techniques to improve spinal decompression, alignment, therex progression, modalities as needed   Gustave Lindeman L Margery Szostak, PTA 12/25/2023, 12:40 PM

## 2023-12-25 NOTE — Telephone Encounter (Signed)
  Follow up Call-     12/24/2023   12:47 PM  Call back number  Post procedure Call Back phone  # [phone]  Permission to leave phone message Yes     Patient questions:  Do you have a fever, pain , or abdominal swelling? No. Pain Score  0 *  Have you tolerated food without any problems? Yes  Have you been able to return to your normal activities? Yes.    Do you have any questions about your discharge instructions: Diet   No. Medications  No. Follow up visit  No.  Do you have questions or concerns about your Care? No.  Actions: * If pain score is 4 or above: No action needed, pain <4.

## 2023-12-29 ENCOUNTER — Ambulatory Visit

## 2023-12-29 ENCOUNTER — Ambulatory Visit: Payer: Self-pay | Admitting: Internal Medicine

## 2023-12-29 LAB — SURGICAL PATHOLOGY

## 2024-01-01 ENCOUNTER — Ambulatory Visit

## 2024-01-06 ENCOUNTER — Encounter

## 2024-01-13 ENCOUNTER — Encounter

## 2024-03-29 ENCOUNTER — Telehealth: Payer: Self-pay | Admitting: Hematology and Oncology

## 2024-03-29 NOTE — Telephone Encounter (Signed)
 Mailed reminder to patient regarding rescheduled appointment from 05/03/2024 to 05/14/2024.

## 2024-05-03 ENCOUNTER — Other Ambulatory Visit: Payer: Medicaid Other

## 2024-05-03 ENCOUNTER — Ambulatory Visit: Payer: Medicaid Other | Admitting: Hematology and Oncology

## 2024-05-13 ENCOUNTER — Other Ambulatory Visit: Payer: Self-pay | Admitting: *Deleted

## 2024-05-13 DIAGNOSIS — C50411 Malignant neoplasm of upper-outer quadrant of right female breast: Secondary | ICD-10-CM

## 2024-05-14 ENCOUNTER — Inpatient Hospital Stay: Attending: Hematology and Oncology

## 2024-05-14 ENCOUNTER — Inpatient Hospital Stay: Admitting: Hematology and Oncology

## 2024-05-14 VITALS — BP 114/81 | HR 73 | Resp 18 | Ht 62.6 in | Wt 199.8 lb

## 2024-05-14 DIAGNOSIS — C50912 Malignant neoplasm of unspecified site of left female breast: Secondary | ICD-10-CM | POA: Insufficient documentation

## 2024-05-14 DIAGNOSIS — Z171 Estrogen receptor negative status [ER-]: Secondary | ICD-10-CM | POA: Diagnosis not present

## 2024-05-14 DIAGNOSIS — Z170 Estrogen receptor positive status [ER+]: Secondary | ICD-10-CM

## 2024-05-14 DIAGNOSIS — Z1731 Human epidermal growth factor receptor 2 positive status: Secondary | ICD-10-CM | POA: Diagnosis not present

## 2024-05-14 DIAGNOSIS — Z79899 Other long term (current) drug therapy: Secondary | ICD-10-CM | POA: Insufficient documentation

## 2024-05-14 DIAGNOSIS — Z1722 Progesterone receptor negative status: Secondary | ICD-10-CM | POA: Diagnosis not present

## 2024-05-14 DIAGNOSIS — C773 Secondary and unspecified malignant neoplasm of axilla and upper limb lymph nodes: Secondary | ICD-10-CM | POA: Diagnosis not present

## 2024-05-14 DIAGNOSIS — C50411 Malignant neoplasm of upper-outer quadrant of right female breast: Secondary | ICD-10-CM

## 2024-05-14 LAB — CMP (CANCER CENTER ONLY)
ALT: 74 U/L — ABNORMAL HIGH (ref 0–44)
AST: 78 U/L — ABNORMAL HIGH (ref 15–41)
Albumin: 4.7 g/dL (ref 3.5–5.0)
Alkaline Phosphatase: 114 U/L (ref 38–126)
Anion gap: 11 (ref 5–15)
BUN: 10 mg/dL (ref 6–20)
CO2: 27 mmol/L (ref 22–32)
Calcium: 10 mg/dL (ref 8.9–10.3)
Chloride: 98 mmol/L (ref 98–111)
Creatinine: 0.7 mg/dL (ref 0.44–1.00)
GFR, Estimated: >60 mL/min (ref >60)
Glucose, Bld: 102 mg/dL — ABNORMAL HIGH (ref 70–99)
Potassium: 3.4 mmol/L — ABNORMAL LOW (ref 3.5–5.1)
Sodium: 136 mmol/L (ref 135–145)
Total Bilirubin: 1.2 mg/dL (ref 0.0–1.2)
Total Protein: 8 g/dL (ref 6.5–8.1)

## 2024-05-14 LAB — CBC WITH DIFFERENTIAL (CANCER CENTER ONLY)
Abs Immature Granulocytes: 0.01 K/uL (ref 0.00–0.07)
Basophils Absolute: 0.1 K/uL (ref 0.0–0.1)
Basophils Relative: 1 %
Eosinophils Absolute: 0.1 K/uL (ref 0.0–0.5)
Eosinophils Relative: 3 %
HCT: 41.6 % (ref 36.0–46.0)
Hemoglobin: 13.7 g/dL (ref 12.0–15.0)
Immature Granulocytes: 0 %
Lymphocytes Relative: 34 %
Lymphs Abs: 1.8 K/uL (ref 0.7–4.0)
MCH: 28.8 pg (ref 26.0–34.0)
MCHC: 32.9 g/dL (ref 30.0–36.0)
MCV: 87.4 fL (ref 80.0–100.0)
Monocytes Absolute: 0.4 K/uL (ref 0.1–1.0)
Monocytes Relative: 8 %
Neutro Abs: 2.8 K/uL (ref 1.7–7.7)
Neutrophils Relative %: 54 %
Platelet Count: 277 K/uL (ref 150–400)
RBC: 4.76 MIL/uL (ref 3.87–5.11)
RDW: 12.6 % (ref 11.5–15.5)
WBC Count: 5.2 K/uL (ref 4.0–10.5)
nRBC: 0 % (ref 0.0–0.2)

## 2024-05-14 NOTE — Progress Notes (Signed)
 [facility]  [phone] [phone]) [phone]    ID: Mellonie Guess DOB: [birth year]  MR#: [id number]  RDW#:[id number]  Patient Care Team: Lang Dedra DASEN, MD as PCP - General (Family Medicine) Tyree Nanetta SAILOR, RN as Oncology Nurse Navigator Ethyl Lenis, MD as Consulting Physician (General Surgery) Dewey Rush, MD as Consulting Physician (Radiation Oncology) Rolan Ezra RAMAN, MD as Consulting Physician (Cardiology) Lang Dedra DASEN, MD (Family Medicine) Amber Stalls, MD OTHER MD:  CHIEF COMPLAINT: estrogen receptor negative, Her2 positive breast cancer (s/p right mastectomy)  CURRENT TREATMENT: observation  INTERVAL HISTORY: History of Present Illness Alice [name] is a 48 year old female with breast cancer who presents for a routine follow-up.  She has no new health issues over the past year and has been vigilant in monitoring her breasts for any new lumps, with none detected. No changes in breathing, cough, chest pain, or shortness of breath. No issues with urination and regular bowel movements without blood in her stool.  Recent lab work shows elevated liver enzymes, which have been present for almost two years without significant change. No history of hepatitis. She uses Tylenol  once a week for headaches. She is not on any cholesterol medication and has no known cholesterol problems.  Rest of the pertinent 10 point ROS reviewed and negative.   COVID 19 VACCINATION STATUS: Refuses vaccination; has not had COVID   HISTORY OF CURRENT ILLNESS: From the original intake note:  Shakerra Red presented with right breast pain with swelling and skin changes (duskiness and dimpling) and left breast pain at 6 o'clock. She underwent bilateral diagnostic mammography with tomography and bilateral breast ultrasonography at [facility] on 09/23/2019 showing: breast density category C; 5.2 cm irregular mass in right breast spanning 10-12 o'clock, with  marked peau d'orange changes concerning for inflammatory component; 3.3 cm enlarged lymph node in right axilla; indeterminate 1 cm oval mass in left breast at 5 o'clock.  Accordingly on 09/30/2019 she proceeded to biopsy of the right breast area in question. The pathology from this procedure ([id number]) showed: invasive mammary carcinoma, grade 3, e-cadherin positive. Prognostic indicators significant for: estrogen receptor, 0% negative and progesterone receptor, 0% negative. Proliferation marker Ki67 at 80%. HER2 positive by immunohistochemistry (3+).  The biopsied right axillary lymph node was positive for metastatic carcinoma.  She also underwent cyst aspiration of the 1 cm mass in the left breast the same day.  The patient's subsequent history is as detailed below.   PAST MEDICAL HISTORY: Past Medical History:  Diagnosis Date   Anemia 11/29/2020   Blood transfusion without reported diagnosis    Breast cancer (HCC) 2021   Right   Cataract    Glaucoma    History of chemotherapy    FINISHED CHEMO MAY 2022   Hypertension 11/29/2020    PAST SURGICAL HISTORY: Past Surgical History:  Procedure Laterality Date   BREAST BIOPSY     BREAST RECONSTRUCTION WITH PLACEMENT OF TISSUE EXPANDER AND FLEX HD (ACELLULAR HYDRATED DERMIS) Right 05/29/2021   Procedure: Right breast reconstruction with latissimus flap and tissue expander placement.  Application of acellular dermal matrix.;  Surgeon: Elisabeth Craig RAMAN, MD;  Location: [facility];  Service: Plastics;  Laterality: Right;   BREAST REDUCTION SURGERY Left 08/17/2021   Procedure: LEFT MAMMARY REDUCTION  (BREAST) WITH FREE NIPPLE GRAFT;  Surgeon: Elisabeth Craig RAMAN, MD;  Location: [facility];  Service: Plastics;  Laterality: Left;   CHOLECYSTECTOMY     more than 10 yrs ago per  pt on 11-29-2020, laparoscopic   ERCP     IR IMAGING GUIDED PORT INSERTION  10/13/2019   MASTECTOMY Right    MASTECTOMY MODIFIED RADICAL Right 03/06/2020    Procedure: MASTECTOMY MODIFIED RADICAL;  Surgeon: Ethyl Lenis, MD;  Location: [facility];  Service: General;  Laterality: Right;  RNFA   PORT-A-CATH REMOVAL Left 12/04/2020   Procedure: REMOVAL PORT-A-CATH;  Surgeon: Vernetta Berg, MD;  Location: [facility];  Service: General;  Laterality: Left;   REDUCTION MAMMAPLASTY Left    REMOVAL OF BILATERAL TISSUE EXPANDERS WITH PLACEMENT OF BILATERAL BREAST IMPLANTS Right 08/17/2021   Procedure: REMOVAL OF RIGHT TISSUE EXPANDER WITH PLACEMENT OF RIGHT BREAST IMPLANT;  Surgeon: Elisabeth Craig RAMAN, MD;  Location: [facility];  Service: Plastics;  Laterality: Right;    FAMILY HISTORY: Family History  Problem Relation Age of Onset   Heart disease Maternal Grandfather    Colon cancer Neg Hx    Esophageal cancer Neg Hx    Stomach cancer Neg Hx    Rectal cancer Neg Hx     As of 09/2019-- her father is 1 and her mother is 74. She has two brothers and one sister. There is no cancer in her family to her knowledge.    GYNECOLOGIC HISTORY:  Patient's last menstrual period was 09/09/2019. Menarche: 48 years old Age at first live birth: 48 years old GX P 2 LMP regular periods lasting 3 days; periods stopped after the first chemotherapy dose May 2021 Contraceptive: previously used, has not used for the last 2 years HRT n/a  Hysterectomy? no BSO? no   SOCIAL HISTORY: (updated March 2022))  Pansey is originally from the [place].  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 Husband Julio works as a financial risk analyst.  He has some health issues as well, including diabetes.  She lives at home with Julio and their two children. Son Arman, age 63, has special needs.  Son Blane, age 81, is in high school.    ADVANCED DIRECTIVES: In the absence of any documentation to the contrary, the patient's spouse is their HCPOA.    HEALTH MAINTENANCE: Social History   Tobacco Use   Smoking status: Never   Smokeless  tobacco: Never  Vaping Use   Vaping status: Never Used  Substance Use Topics   Alcohol use: Never   Drug use: Never     Colonoscopy: n/a (age)  PAP: 2020  Bone density: n/a (age)   No Known Allergies  Current Outpatient Medications  Medication Sig Dispense Refill   lisinopril  (ZESTRIL ) 10 MG tablet Take 1 tablet (10 mg total) by mouth daily. 90 tablet 4   ondansetron  (ZOFRAN -ODT) 4 MG disintegrating tablet Take 1 tablet (4 mg total) by mouth every 8 (eight) hours as needed for nausea or vomiting. 20 tablet 0   OVER THE COUNTER MEDICATION      OVER THE COUNTER MEDICATION      cholecalciferol (VITAMIN D3) 25 MCG (1000 UNIT) tablet Take 1,000 Units by mouth daily.     No current facility-administered medications for this visit.    OBJECTIVE: Spanish speaker who appears stated age  Vitals:   05/14/24 1240  BP: 114/81  Pulse: 73  Resp: 18  SpO2: 100%    Wt Readings from Last 3 Encounters:  05/14/24 199 lb 12.8 oz (90.6 kg)  12/24/23 200 lb (90.7 kg)  12/04/23 200 lb (90.7 kg)   Body mass index is 35.85 kg/m.  ECOG FS:1 - Symptomatic but completely ambulatory   She appears alert, oriented, no acute distress No cervical adenopathy CTA bilaterally RRR No new breast masses. No regional adenopathy No palpable hepatosplenomegaly No LE edema   LAB RESULTS:  CMP     Component Value Date/Time   NA 136 05/14/2024 1216   K 3.4 (L) 05/14/2024 1216   CL 98 05/14/2024 1216   CO2 27 05/14/2024 1216   GLUCOSE 102 (H) 05/14/2024 1216   BUN 10 05/14/2024 1216   CREATININE 0.70 05/14/2024 1216   CALCIUM 10.0 05/14/2024 1216   PROT 8.0 05/14/2024 1216   ALBUMIN 4.7 05/14/2024 1216   AST 78 (H) 05/14/2024 1216   ALT 74 (H) 05/14/2024 1216   ALKPHOS 114 05/14/2024 1216   BILITOT 1.2 05/14/2024 1216   GFRNONAA >60 05/14/2024 1216   GFRAA >60 03/13/2020 1142   GFRAA >60 10/06/2019 0826    No results found for: TOTALPROTELP, ALBUMINELP, A1GS, A2GS, BETS,  BETA2SER, GAMS, MSPIKE, SPEI  Lab Results  Component Value Date   WBC 5.2 05/14/2024   NEUTROABS 2.8 05/14/2024   HGB 13.7 05/14/2024   HCT 41.6 05/14/2024   MCV 87.4 05/14/2024   PLT 277 05/14/2024    No results found for: LABCA2  No components found for: [id number]  No results for input(s): INR in the last 168 hours.  No results found for: LABCA2  No results found for: RJW800  No results found for: CAN125  No results found for: CAN153  No results found for: CA2729  No components found for: HGQUANT  No results found for: CEA1, CEA / No results found for: CEA1, CEA   No results found for: AFPTUMOR  No results found for: CHROMOGRNA  No results found for: KPAFRELGTCHN, LAMBDASER, KAPLAMBRATIO (kappa/lambda light chains)  No results found for: HGBA, HGBA2QUANT, HGBFQUANT, HGBSQUAN (Hemoglobinopathy evaluation)   No results found for: LDH  No results found for: IRON, TIBC, IRONPCTSAT (Iron and TIBC)  No results found for: FERRITIN  Urinalysis No results found for: COLORURINE, APPEARANCEUR, LABSPEC, PHURINE, GLUCOSEU, HGBUR, BILIRUBINUR, KETONESUR, PROTEINUR, UROBILINOGEN, NITRITE, LEUKOCYTESUR   STUDIES: No results found.   ELIGIBLE FOR AVAILABLE RESEARCH PROTOCOL: no  ASSESSMENT: 49 y.o. [place] Spanish speaker status post right breast upper outer quadrant sites biopsy and right axillary lymph node biopsy 09/30/2019 for a clinical T3 N2, stage IIIA invasive ductal carcinoma, grade 3, estrogen and progesterone receptor negative, HER-2 amplified, with an MIB-1 of 80%.  (a) CT chest abdomen and pelvis and bone scan 10/15/2019 showed no evidence of metastatic disease  (1) genetics testing 10/13/2019 through the Common Hereditary Cancers Panel offered by [facility] found no deleterious mutations in APC, ATM, AXIN2, BARD1, BMPR1A, BRCA1, BRCA2, BRIP1, CDH1, CDKN2A (p14ARF), CDKN2A  (p16INK4a), CKD4, CHEK2, CTNNA1, DICER1, EPCAM (Deletion/duplication testing only), GREM1 (promoter region deletion/duplication testing only), KIT, MEN1, MLH1, MSH2, MSH3, MSH6, MUTYH, NBN, NF1, NHTL1, PALB2, PDGFRA, PMS2, POLD1, POLE, PTEN, RAD50, RAD51C, RAD51D, RNF43, SDHB, SDHC, SDHD, SMAD4, SMARCA4. STK11, TP53, TSC1, TSC2, and VHL.  The following genes were evaluated for sequence changes only: SDHA and HOXB13 c.251G>A variant only.  (2) neoadjuvant chemotherapy consisting of trastuzumab , pertuzumab , docetaxel  and carboplatin  every 21 days x 6 started 10/19/2019, completed 02/01/2020  (3) trastuzumab  and pertuzumab  continued to total 1 year (through May 2022).  (a) echo 10/15/2019 shows an ejection fraction in the 65-70% range.  (b) echo 04/25/2020 shows an ejection fraction in the 60-65% range  (c) echo 07/17/2020 shows an ejection fraction in excess of  75%  (4) status post right modified radical mastectomy on 03/06/2020 showing no residual cancer in the breast or lymph nodes (ypT0 ypN0)  (a) a total of 7 right axillary lymph nodes were removed  (5) adjuvant radiation 04/13/20-05/30/20:   The right chest wall and regional nodes were treated to 50.4 Gy in 28 fractions, followed by a 10 Gy boost in 5 fractions.    PLAN:   Assessment and Plan Assessment & Plan History of right breast cancer, status post surgery Breast cancer diagnosed in 2021, status post surgery. Recent mammogram of the left breast in March showed no abnormalities. No concern on exam.  Elevated liver enzymes Chronic elevation of liver enzymes for almost two years. Possible fatty liver considered.  - Ordered liver ultrasound to evaluate for fatty liver or other abnormalities. - Continue annual monitoring of liver enzymes.  Right breast surgical scar pain Pain localized to the right breast surgical scar area, likely due to scar tissue.  Total time spent: 20 min  *Total Encounter Time as defined by the Centers for  Medicare and Medicaid Services includes, in addition to the face-to-face time of a patient visit (documented in the note above) non-face-to-face time: obtaining and reviewing outside history, ordering and reviewing medications, tests or procedures, care coordination (communications with other health care professionals or caregivers) and documentation in the medical record.

## 2024-05-14 NOTE — Progress Notes (Signed)
 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  She is now on observation.  Certified Spanish interpreter present during the conversation      COVID 19 VACCINATION STATUS: Refuses vaccination; has not had COVID   HISTORY OF CURRENT ILLNESS: From the original intake note:  Alice [name] presented with right breast pain with swelling and skin changes (duskiness and dimpling) and left breast pain at 6 o'clock. She underwent bilateral diagnostic mammography with tomography and bilateral breast ultrasonography at [facility] on 09/23/2019 showing: breast density category C; 5.2 cm irregular mass in right breast spanning 10-12 o'clock, with marked peau d'orange changes concerning for inflammatory component; 3.3 cm enlarged lymph node in right axilla; indeterminate 1 cm oval mass in left breast at 5 o'clock.  Accordingly on 09/30/2019 she proceeded to biopsy of the right breast area in question. The pathology from this procedure ([id number]) showed: invasive mammary carcinoma, grade 3, e-cadherin positive. Prognostic indicators significant for: estrogen receptor, 0% negative and progesterone receptor, 0% negative. Proliferation marker Ki67 at 80%. HER2 positive by immunohistochemistry  (3+).  The biopsied right axillary lymph node was positive for metastatic carcinoma.  She also underwent cyst aspiration of the 1 cm mass in the left breast the same day.  The patient's subsequent history is as detailed below.   PAST MEDICAL HISTORY: Past Medical History:  Diagnosis Date   Anemia 11/29/2020   Blood transfusion without reported diagnosis    Breast cancer (HCC) 2021   Right   Cataract    Glaucoma    History of chemotherapy    FINISHED CHEMO MAY 2022   Hypertension 11/29/2020    PAST SURGICAL HISTORY: Past Surgical History:  Procedure Laterality Date   BREAST BIOPSY     BREAST RECONSTRUCTION WITH PLACEMENT OF TISSUE EXPANDER AND FLEX HD (ACELLULAR HYDRATED DERMIS) Right 05/29/2021   Procedure: Right breast reconstruction with latissimus flap and tissue expander placement.  Application of acellular dermal matrix.;  Surgeon: Elisabeth Craig RAMAN, MD;  Location: [facility];  Service: Plastics;  Laterality: Right;   BREAST REDUCTION SURGERY Left 08/17/2021   Procedure: LEFT MAMMARY REDUCTION  (BREAST) WITH FREE NIPPLE GRAFT;  Surgeon: Elisabeth Craig RAMAN, MD;  Location: [facility];  Service: Plastics;  Laterality: Left;   CHOLECYSTECTOMY     more than 10 yrs ago per pt on 11-29-2020, laparoscopic   ERCP     IR IMAGING GUIDED PORT INSERTION  10/13/2019   MASTECTOMY Right    MASTECTOMY MODIFIED RADICAL Right 03/06/2020   Procedure: MASTECTOMY MODIFIED RADICAL;  Surgeon: Ethyl Lenis, MD;  Location: [facility];  Service: General;  Laterality: Right;  RNFA   PORT-A-CATH REMOVAL Left 12/04/2020   Procedure: REMOVAL PORT-A-CATH;  Surgeon: Vernetta Berg, MD;  Location: [facility];  Service: General;  Laterality: Left;   REDUCTION MAMMAPLASTY Left    REMOVAL OF BILATERAL TISSUE EXPANDERS WITH PLACEMENT  OF BILATERAL BREAST IMPLANTS Right 08/17/2021   Procedure: REMOVAL OF RIGHT TISSUE EXPANDER WITH PLACEMENT OF RIGHT BREAST IMPLANT;  Surgeon: Elisabeth Craig RAMAN, MD;  Location: [facility];  Service: Plastics;  Laterality: Right;    FAMILY HISTORY: Family History  Problem Relation Age of Onset   Heart disease Maternal Grandfather    Colon cancer Neg Hx    Esophageal cancer Neg Hx    Stomach cancer Neg Hx    Rectal cancer Neg Hx     As of 09/2019-- her father is 61 and her mother is 42. She has two brothers and one sister. There is no cancer in her family to her knowledge.    GYNECOLOGIC HISTORY:  Patient's last menstrual period was 09/09/2019. Menarche: 48 years old Age at first live birth: 48 years old GX P 2 LMP regular periods lasting 3 days; periods stopped after the first chemotherapy dose May 2021 Contraceptive: previously used, has not used for the last 2 years HRT n/a  Hysterectomy? no BSO? no   SOCIAL HISTORY: (updated March 2022))  Alice [name] is originally from the [place].  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 Husband Alice [name] works as a financial risk analyst.  He has some health issues as well, including diabetes.  She lives at home with Alice [name] and their two children. Son Alice [name], age 39, has special needs.  Son Alice [name], age 34, is in high school.    ADVANCED DIRECTIVES: In the absence of any documentation to the contrary, the patient's spouse is their HCPOA.    HEALTH MAINTENANCE: Social History   Tobacco Use   Smoking status: Never   Smokeless tobacco: Never  Vaping Use   Vaping status: Never Used  Substance Use Topics   Alcohol use: Never   Drug use: Never     Colonoscopy: n/a (age)  PAP: 2020  Bone density: n/a (age)   No Known Allergies  Current Outpatient Medications  Medication Sig Dispense Refill   lisinopril  (ZESTRIL ) 10 MG tablet Take 1 tablet (10 mg total) by mouth daily. 90 tablet 4   ondansetron  (ZOFRAN -ODT) 4 MG disintegrating tablet Take 1 tablet (4 mg total) by mouth every 8 (eight) hours as needed for nausea or vomiting. 20 tablet 0   OVER THE COUNTER MEDICATION       OVER THE COUNTER MEDICATION      cholecalciferol (VITAMIN D3) 25 MCG (1000 UNIT) tablet Take 1,000 Units by mouth daily.     No current facility-administered medications for this visit.    OBJECTIVE: Spanish speaker who appears stated age  Vitals:   05/14/24 1240  BP: 114/81  Pulse: 73  Resp: 18  SpO2: 100%    Wt Readings from Last 3 Encounters:  05/14/24 199 lb 12.8 oz (90.6 kg)  12/24/23 200 lb (90.7 kg)  12/04/23 200 lb (90.7 kg)   Body mass index is 35.85 kg/m.    ECOG FS:1 - Symptomatic but completely ambulatory    Physical Exam Constitutional:      Appearance: Normal appearance.  Chest:     Comments: Bilateral breasts inspected.  She is status post right mastectomy with reconstruction.  No palpable masses in the reconstruction site.  No regional adenopathy.  Left breast normal to inspection and palpation. Musculoskeletal:     Cervical back: Normal range of motion and neck supple. No rigidity.  Lymphadenopathy:     Cervical: Cervical adenopathy (Small cervical lymphadenopathy in the right neck) present.  Neurological:  Mental Status: She is alert.       LAB RESULTS:  CMP     Component Value Date/Time   NA 136 05/14/2024 1216   K 3.4 (L) 05/14/2024 1216   CL 98 05/14/2024 1216   CO2 27 05/14/2024 1216   GLUCOSE 102 (H) 05/14/2024 1216   BUN 10 05/14/2024 1216   CREATININE 0.70 05/14/2024 1216   CALCIUM 10.0 05/14/2024 1216   PROT 8.0 05/14/2024 1216   ALBUMIN 4.7 05/14/2024 1216   AST 78 (H) 05/14/2024 1216   ALT 74 (H) 05/14/2024 1216   ALKPHOS 114 05/14/2024 1216   BILITOT 1.2 05/14/2024 1216   GFRNONAA >60 05/14/2024 1216   GFRAA >60 03/13/2020 1142   GFRAA >60 10/06/2019 0826    No results found for: TOTALPROTELP, ALBUMINELP, A1GS, A2GS, BETS, BETA2SER, GAMS, MSPIKE, SPEI  Lab Results  Component Value Date   WBC 5.2 05/14/2024   NEUTROABS 2.8 05/14/2024   HGB 13.7 05/14/2024   HCT 41.6 05/14/2024   MCV 87.4  05/14/2024   PLT 277 05/14/2024    No results found for: LABCA2  No components found for: [id number]  No results for input(s): INR in the last 168 hours.  No results found for: LABCA2  No results found for: RJW800  No results found for: CAN125  No results found for: CAN153  No results found for: CA2729  No components found for: HGQUANT  No results found for: CEA1, CEA / No results found for: CEA1, CEA   No results found for: AFPTUMOR  No results found for: CHROMOGRNA  No results found for: KPAFRELGTCHN, LAMBDASER, KAPLAMBRATIO (kappa/lambda light chains)  No results found for: HGBA, HGBA2QUANT, HGBFQUANT, HGBSQUAN (Hemoglobinopathy evaluation)   No results found for: LDH  No results found for: IRON, TIBC, IRONPCTSAT (Iron and TIBC)  No results found for: FERRITIN  Urinalysis No results found for: COLORURINE, APPEARANCEUR, LABSPEC, PHURINE, GLUCOSEU, HGBUR, BILIRUBINUR, KETONESUR, PROTEINUR, UROBILINOGEN, NITRITE, LEUKOCYTESUR   STUDIES: No results found.   ELIGIBLE FOR AVAILABLE RESEARCH PROTOCOL: no  ASSESSMENT: 48 y.o[place] Spanish speaker status post right breast upper outer quadrant sites biopsy and right axillary lymph node biopsy 09/30/2019 for a clinical T3 N2, stage IIIA invasive ductal carcinoma, grade 3, estrogen and progesterone receptor negative, HER-2 amplified, with an MIB-1 of 80%.  (a) CT chest abdomen and pelvis and bone scan 10/15/2019 showed no evidence of metastatic disease  (1) genetics testing 10/13/2019 through the Common Hereditary Cancers Panel offered by [facility] found no deleterious mutations in APC, ATM, AXIN2, BARD1, BMPR1A, BRCA1, BRCA2, BRIP1, CDH1, CDKN2A (p14ARF), CDKN2A (p16INK4a), CKD4, CHEK2, CTNNA1, DICER1, EPCAM (Deletion/duplication testing only), GREM1 (promoter region deletion/duplication testing only), KIT, MEN1, MLH1, MSH2, MSH3, MSH6, MUTYH,  NBN, NF1, NHTL1, PALB2, PDGFRA, PMS2, POLD1, POLE, PTEN, RAD50, RAD51C, RAD51D, RNF43, SDHB, SDHC, SDHD, SMAD4, SMARCA4. STK11, TP53, TSC1, TSC2, and VHL.  The following genes were evaluated for sequence changes only: SDHA and HOXB13 c.251G>A variant only.  (2) neoadjuvant chemotherapy consisting of trastuzumab , pertuzumab , docetaxel  and carboplatin  every 21 days x 6 started 10/19/2019, completed 02/01/2020  (3) trastuzumab  and pertuzumab  continued to total 1 year (through May 2022).  (a) echo 10/15/2019 shows an ejection fraction in the 65-70% range.  (b) echo 04/25/2020 shows an ejection fraction in the 60-65% range  (c) echo 07/17/2020 shows an ejection fraction in excess of 75%  (4) status post right modified radical mastectomy on 03/06/2020 showing no residual cancer in the breast or lymph nodes (ypT0 ypN0)  (a) a  total of 7 right axillary lymph nodes were removed  (5) adjuvant radiation 04/13/20-05/30/20:   The right chest wall and regional nodes were treated to 50.4 Gy in 28 fractions, followed by a 10 Gy boost in 5 fractions.    PLAN: Arlo is now a little more than 3 yrs from definitive surgery No concern for recurrence on exam. Mammogram of the left breast in January 2024 with no concerns.  This will be repeated in January 2025 and has been ordered. Leukopenia noted on previous exam has now resolved.  Her CBC is perfectly normal, very mild elevation in T. bili, we can continue to monitor this, no concerns.   She will RTC in one yr or sooner as needed. All her questions were answered to the best my knowledge. Total time spent: 20 min   *Total Encounter Time as defined by the Centers for Medicare and Medicaid Services includes, in addition to the face-to-face time of a patient visit (documented in the note above) non-face-to-face time: obtaining and reviewing outside history, ordering and reviewing medications, tests or procedures, care coordination (communications with other health  care professionals or caregivers) and documentation in the medical record.

## 2024-05-25 ENCOUNTER — Ambulatory Visit (HOSPITAL_COMMUNITY)

## 2024-05-27 ENCOUNTER — Ambulatory Visit (HOSPITAL_COMMUNITY): Admission: RE | Admit: 2024-05-27 | Discharge: 2024-05-27 | Attending: Hematology and Oncology

## 2024-05-27 DIAGNOSIS — Z170 Estrogen receptor positive status [ER+]: Secondary | ICD-10-CM | POA: Insufficient documentation

## 2024-05-27 DIAGNOSIS — C50411 Malignant neoplasm of upper-outer quadrant of right female breast: Secondary | ICD-10-CM | POA: Diagnosis present

## 2024-06-07 ENCOUNTER — Ambulatory Visit: Payer: Self-pay | Admitting: Hematology and Oncology

## 2024-06-08 ENCOUNTER — Other Ambulatory Visit: Payer: Self-pay | Admitting: *Deleted

## 2024-06-08 DIAGNOSIS — C50411 Malignant neoplasm of upper-outer quadrant of right female breast: Secondary | ICD-10-CM

## 2025-05-16 ENCOUNTER — Inpatient Hospital Stay: Admitting: Hematology and Oncology

## 2025-05-17 ENCOUNTER — Inpatient Hospital Stay: Admitting: Hematology and Oncology
